# Patient Record
Sex: Female | Born: 1960
Health system: Southern US, Community
[De-identification: ages and names within clinical notes are randomized; demographics above are authoritative.]

## PROBLEM LIST (undated history)

## (undated) DIAGNOSIS — I428 Other cardiomyopathies: Secondary | ICD-10-CM

## (undated) DIAGNOSIS — Z9889 Other specified postprocedural states: Secondary | ICD-10-CM

## (undated) DIAGNOSIS — I4892 Unspecified atrial flutter: Secondary | ICD-10-CM

## (undated) DIAGNOSIS — R51 Headache: Secondary | ICD-10-CM

## (undated) DIAGNOSIS — G4733 Obstructive sleep apnea (adult) (pediatric): Secondary | ICD-10-CM

## (undated) DIAGNOSIS — I10 Essential (primary) hypertension: Secondary | ICD-10-CM

## (undated) DIAGNOSIS — R519 Headache, unspecified: Secondary | ICD-10-CM

## (undated) DIAGNOSIS — F329 Major depressive disorder, single episode, unspecified: Secondary | ICD-10-CM

## (undated) DIAGNOSIS — J984 Other disorders of lung: Secondary | ICD-10-CM

## (undated) DIAGNOSIS — F32A Depression, unspecified: Secondary | ICD-10-CM

## (undated) DIAGNOSIS — F419 Anxiety disorder, unspecified: Secondary | ICD-10-CM

## (undated) HISTORY — DX: Other specified postprocedural states: Z98.890

## (undated) HISTORY — DX: Essential (primary) hypertension: I10

## (undated) HISTORY — DX: Obstructive sleep apnea (adult) (pediatric): G47.33

## (undated) HISTORY — DX: Other cardiomyopathies: I42.8

## (undated) HISTORY — PX: ABDOMINAL HYSTERECTOMY: SHX81

## (undated) HISTORY — DX: Unspecified atrial flutter: I48.92

## (undated) HISTORY — PX: CHOLECYSTECTOMY: SHX55

## (undated) HISTORY — PX: COLONOSCOPY: SHX174

## (undated) HISTORY — DX: Other disorders of lung: J98.4

---

## 2008-08-26 ENCOUNTER — Ambulatory Visit (HOSPITAL_COMMUNITY): Payer: Self-pay | Admitting: Psychiatry

## 2008-09-16 ENCOUNTER — Ambulatory Visit (HOSPITAL_COMMUNITY): Payer: Self-pay | Admitting: Psychiatry

## 2008-10-16 ENCOUNTER — Ambulatory Visit (HOSPITAL_COMMUNITY): Payer: Self-pay | Admitting: Psychiatry

## 2011-07-14 ENCOUNTER — Encounter (INDEPENDENT_AMBULATORY_CARE_PROVIDER_SITE_OTHER): Payer: Self-pay | Admitting: General Surgery

## 2013-07-16 ENCOUNTER — Telehealth: Payer: Self-pay | Admitting: Obstetrics & Gynecology

## 2013-09-03 ENCOUNTER — Emergency Department (HOSPITAL_COMMUNITY)
Admission: EM | Admit: 2013-09-03 | Discharge: 2013-09-03 | Disposition: A | Discharge status: Home / Self Care | Payer: 59 | Attending: Emergency Medicine | Admitting: Emergency Medicine

## 2013-09-03 ENCOUNTER — Encounter (HOSPITAL_COMMUNITY): Payer: Self-pay | Admitting: Emergency Medicine

## 2013-09-03 DIAGNOSIS — M659 Synovitis and tenosynovitis, unspecified: Secondary | ICD-10-CM

## 2013-09-03 DIAGNOSIS — Z79899 Other long term (current) drug therapy: Secondary | ICD-10-CM | POA: Insufficient documentation

## 2013-09-03 DIAGNOSIS — M65839 Other synovitis and tenosynovitis, unspecified forearm: Secondary | ICD-10-CM | POA: Insufficient documentation

## 2013-09-03 DIAGNOSIS — M65849 Other synovitis and tenosynovitis, unspecified hand: Principal | ICD-10-CM

## 2013-09-03 MED ORDER — DEXAMETHASONE SODIUM PHOSPHATE 4 MG/ML IJ SOLN
8.0000 mg | Freq: Once | INTRAMUSCULAR | Status: DC
  Filled 2013-09-03: qty 2

## 2013-09-03 MED ORDER — PREDNISONE 50 MG PO TABS
60.0000 mg | ORAL_TABLET | Freq: Once | ORAL | Status: AC
  Administered 2013-09-03: 60 mg via ORAL

## 2013-09-03 MED ORDER — HYDROCODONE-ACETAMINOPHEN 5-325 MG PO TABS: 1.0000 | ORAL_TABLET | ORAL | Status: DC | PRN

## 2013-09-03 MED ORDER — PREDNISONE 50 MG PO TABS
ORAL_TABLET | ORAL | Status: AC
  Filled 2013-09-03: qty 1

## 2013-09-03 MED ORDER — PREDNISONE 10 MG PO TABS: 20.0000 mg | ORAL_TABLET | Freq: Every day | ORAL | Status: DC

## 2013-09-03 MED ORDER — PREDNISONE 10 MG PO TABS
ORAL_TABLET | ORAL | Status: AC
  Filled 2013-09-03: qty 1

## 2013-09-03 MED ORDER — MELOXICAM 7.5 MG PO TABS: ORAL_TABLET | ORAL | Status: DC

## 2013-09-03 MED ORDER — HYDROCODONE-ACETAMINOPHEN 5-325 MG PO TABS
1.0000 | ORAL_TABLET | Freq: Once | ORAL | Status: AC
Start: 2013-09-03 — End: 2013-09-03
  Administered 2013-09-03: 1 via ORAL
  Filled 2013-09-03: qty 1

## 2013-09-03 MED ORDER — KETOROLAC TROMETHAMINE 10 MG PO TABS
10.0000 mg | ORAL_TABLET | Freq: Once | ORAL | Status: AC
  Administered 2013-09-03: 10 mg via ORAL
  Filled 2013-09-03: qty 1

## 2013-09-03 NOTE — Discharge Instructions (Signed)
Please use the thumb spica splint until you are seen by the orthopedic specialist. Please use prednisone in Mobic daily until all taken. Use Norco for more severe pain. This medication may cause drowsiness, please use with caution. Tendinitis  Tendinitis is redness, soreness, and puffiness (inflammation) of the tendons. Tendons are band-like tissues that connect muscle to bone. Tendinitis often happens in the shoulders, heels, or elbows. It might happen if your job involves doing the same motions over and over. HOME CARE  Use a sling or splint as told by your doctor.  Put ice on the injured area.  Put ice in a plastic bag.  Place a towel between your skin and the bag.  Leave the ice on for 15-20 minutes, 03-04 times a day.  Avoid using your injured arm or leg until the pain goes away.  Do gentle exercises only as told by your doctor. Stop exercises if the pain gets worse, unless your doctor tells you otherwise.  Only take medicines as told by your doctor. GET HELP RIGHT AWAY IF:  Your pain and puffiness get worse.  You have new problems, such as loss of feeling (numbness) in the hands. MAKE SURE YOU:  Understand these instructions.  Will watch your condition.  Will get help right away if you are not doing well or get worse. Document Released: 09/02/2010 Document Revised: 08/15/2011 Document Reviewed: 09/02/2010 [facility] Patient Information 2014 [place], [place].

## 2013-09-03 NOTE — ED Provider Notes (Signed)
CSN: [id number]     Arrival date & time 09/03/13  1047 History   First MD Initiated Contact with Patient 09/03/13 1142     Chief Complaint  Patient presents with  . Hand Pain     (Consider location/radiation/quality/duration/timing/severity/associated sxs/prior Treatment) HPI Comments: Patient is a 53 year old female who presents to the emergency department with complaint of right hand pain. The patient states this is been going off and on for approximately 2 weeks. The patient denies any known injury. She states that the pain starts in the wrist and goes up toward the elbow. Within the last 2 days, the patient is now noticed that she has a" pop in her thumb" when she moves it and she's having increasing pain from the thumb across the top of her right hand and also extending back toward the elbow. The patient has not had any previous operations or procedures involving the hand. She's not had any high fever, drainage from the hand or thumb, or signs of infection. She presents now for additional assistance in evaluation concerning this pain.  Patient is a 53 y.o. female presenting with hand pain. The history is provided by the patient.  Hand Pain Associated symptoms include arthralgias. Pertinent negatives include no abdominal pain, chest pain, coughing or neck pain.    History reviewed. No pertinent past medical history. Past Surgical History  Procedure Laterality Date  . Cholecystectomy    . Abdominal hysterectomy     History reviewed. No pertinent family history. History  Substance Use Topics  . Smoking status: Never Smoker   . Smokeless tobacco: Not on file  . Alcohol Use: Yes     Comment: rarel   OB History   Grav Para Term Preterm Abortions TAB SAB Ect Mult Living                 Review of Systems  Constitutional: Negative for activity change.       All ROS Neg except as noted in HPI  HENT: Negative for nosebleeds.   Eyes: Negative for photophobia and discharge.   Respiratory: Negative for cough, shortness of breath and wheezing.   Cardiovascular: Negative for chest pain and palpitations.  Gastrointestinal: Negative for abdominal pain and blood in stool.  Genitourinary: Negative for dysuria, frequency and hematuria.  Musculoskeletal: Positive for arthralgias. Negative for back pain and neck pain.  Skin: Negative.   Neurological: Negative for dizziness, seizures and speech difficulty.  Psychiatric/Behavioral: Negative for hallucinations and confusion.      Allergies  Review of patient's allergies indicates no known allergies.  Home Medications   Current Outpatient Rx  Name  Route  Sig  Dispense  Refill  . lamoTRIgine (LAMICTAL) 100 MG tablet   Oral   Take 100 mg by mouth daily.         Marland Kitchen venlafaxine XR (EFFEXOR-XR) 150 MG 24 hr capsule   Oral   Take 150 mg by mouth daily with breakfast.          BP 139/77  Pulse 73  Temp(Src) 98.1 F (36.7 C) (Oral)  Resp 14  Ht 5\' 3"  (1.6 m)  Wt 256 lb 11.2 oz (116.438 kg)  BMI 45.48 kg/m2  SpO2 96% Physical Exam  Nursing note and vitals reviewed. Constitutional: She is oriented to person, place, and time. She appears well-developed and well-nourished.  Non-toxic appearance.  HENT:  Head: Normocephalic.  Right Ear: Tympanic membrane and external ear normal.  Left Ear: Tympanic membrane and external ear normal.  Eyes: EOM and lids are normal. Pupils are equal, round, and reactive to light.  Neck: Normal range of motion. Neck supple. Carotid bruit is not present.  Cardiovascular: Normal rate, regular rhythm, normal heart sounds, intact distal pulses and normal pulses.   Pulmonary/Chest: Breath sounds normal. No respiratory distress.  Abdominal: Soft. Bowel sounds are normal. There is no tenderness. There is no guarding.  Musculoskeletal: Normal range of motion.  There is good range of motion noted of the right shoulder and elbow. There is soreness with range of motion of the right wrist.  There is pain with attempted flexion and extension of the thumb. There is particular pain and some crepitus noted with flexion of the distal interphalangeal joint. The capillary refill is less than 2 seconds.  Lymphadenopathy:       Head (right side): No submandibular adenopathy present.       Head (left side): No submandibular adenopathy present.    She has no cervical adenopathy.  Neurological: She is alert and oriented to person, place, and time. She has normal strength. No cranial nerve deficit or sensory deficit.  Skin: Skin is warm and dry.  Psychiatric: Her speech is normal.  Depressed affect.    ED Course  Procedures (including critical care time) Labs Review Labs Reviewed - No data to display Imaging Review No results found.   EKG Interpretation None      MDM Vital signs are well within normal limits. Examination is consistent with a tenosynovitis. Patient is fitted with a thumb spica splint, prescription for Mobic, prednisone, and Norco given to the patient. Patient is given a referral to Dr. Romeo AppleHarrison for evaluation if not improving.    Final diagnoses:  None    *I have reviewed nursing notes, vital signs, and all appropriate lab and imaging results for this patient.Kathie Dike**    Carrah Eppolito M Neng Albee, PA-C 09/03/13 1226

## 2013-09-03 NOTE — Care Management Note (Signed)
ED/CM noted patient did not have health insurance and/or PCP listed in the computer.  Patient was given the [place] resource handout with information on the clinics, food pantries, and the handout for new health insurance sign-up.  Patient expressed appreciation for information received. 

## 2013-09-03 NOTE — ED Notes (Signed)
Pain rt hand for 2 weeks, No known injury

## 2013-09-04 NOTE — ED Provider Notes (Signed)
Medical screening examination/treatment/procedure(s) were performed by non-physician practitioner and as supervising physician I was immediately available for consultation/collaboration.   Somer Trotter R Marieme Mcmackin, MD 09/04/13 0722 

## 2015-05-11 ENCOUNTER — Encounter (INDEPENDENT_AMBULATORY_CARE_PROVIDER_SITE_OTHER): Payer: Self-pay | Admitting: *Deleted

## 2015-07-02 HISTORY — PX: TUMOR REMOVAL: SHX12

## 2015-12-14 ENCOUNTER — Encounter (HOSPITAL_COMMUNITY): Payer: Self-pay | Admitting: General Practice

## 2015-12-14 ENCOUNTER — Inpatient Hospital Stay (HOSPITAL_COMMUNITY)
Admission: AD | Admit: 2015-12-14 | Discharge: 2015-12-19 | DRG: 286 | Disposition: A | Discharge status: Home / Self Care | Payer: 59 | Source: Other Acute Inpatient Hospital | Attending: Internal Medicine | Admitting: Internal Medicine

## 2015-12-14 DIAGNOSIS — Z791 Long term (current) use of non-steroidal anti-inflammatories (NSAID): Secondary | ICD-10-CM

## 2015-12-14 DIAGNOSIS — F32A Depression, unspecified: Secondary | ICD-10-CM | POA: Diagnosis present

## 2015-12-14 DIAGNOSIS — R079 Chest pain, unspecified: Secondary | ICD-10-CM

## 2015-12-14 DIAGNOSIS — F329 Major depressive disorder, single episode, unspecified: Secondary | ICD-10-CM | POA: Diagnosis not present

## 2015-12-14 DIAGNOSIS — I428 Other cardiomyopathies: Secondary | ICD-10-CM | POA: Diagnosis not present

## 2015-12-14 DIAGNOSIS — I110 Hypertensive heart disease with heart failure: Secondary | ICD-10-CM | POA: Diagnosis present

## 2015-12-14 DIAGNOSIS — I4892 Unspecified atrial flutter: Principal | ICD-10-CM | POA: Diagnosis present

## 2015-12-14 DIAGNOSIS — Z7952 Long term (current) use of systemic steroids: Secondary | ICD-10-CM

## 2015-12-14 DIAGNOSIS — E669 Obesity, unspecified: Secondary | ICD-10-CM | POA: Diagnosis not present

## 2015-12-14 DIAGNOSIS — I5043 Acute on chronic combined systolic (congestive) and diastolic (congestive) heart failure: Secondary | ICD-10-CM | POA: Diagnosis not present

## 2015-12-14 DIAGNOSIS — F319 Bipolar disorder, unspecified: Secondary | ICD-10-CM | POA: Diagnosis not present

## 2015-12-14 DIAGNOSIS — R590 Localized enlarged lymph nodes: Secondary | ICD-10-CM | POA: Diagnosis present

## 2015-12-14 DIAGNOSIS — K219 Gastro-esophageal reflux disease without esophagitis: Secondary | ICD-10-CM | POA: Diagnosis present

## 2015-12-14 DIAGNOSIS — R0789 Other chest pain: Secondary | ICD-10-CM

## 2015-12-14 DIAGNOSIS — R000 Tachycardia, unspecified: Secondary | ICD-10-CM | POA: Diagnosis not present

## 2015-12-14 DIAGNOSIS — I5041 Acute combined systolic (congestive) and diastolic (congestive) heart failure: Secondary | ICD-10-CM | POA: Diagnosis present

## 2015-12-14 DIAGNOSIS — J984 Other disorders of lung: Secondary | ICD-10-CM | POA: Diagnosis not present

## 2015-12-14 DIAGNOSIS — I4891 Unspecified atrial fibrillation: Secondary | ICD-10-CM | POA: Diagnosis present

## 2015-12-14 DIAGNOSIS — G4733 Obstructive sleep apnea (adult) (pediatric): Secondary | ICD-10-CM | POA: Diagnosis not present

## 2015-12-14 DIAGNOSIS — R55 Syncope and collapse: Secondary | ICD-10-CM | POA: Diagnosis present

## 2015-12-14 DIAGNOSIS — R599 Enlarged lymph nodes, unspecified: Secondary | ICD-10-CM | POA: Diagnosis not present

## 2015-12-14 HISTORY — DX: Anxiety disorder, unspecified: F41.9

## 2015-12-14 HISTORY — DX: Major depressive disorder, single episode, unspecified: F32.9

## 2015-12-14 HISTORY — DX: Headache: R51

## 2015-12-14 HISTORY — DX: Headache, unspecified: R51.9

## 2015-12-14 HISTORY — DX: Depression, unspecified: F32.A

## 2015-12-14 LAB — COMPREHENSIVE METABOLIC PANEL
ALK PHOS: 91 U/L (ref 38–126)
ALT: 14 U/L (ref 14–54)
ANION GAP: 9 (ref 5–15)
AST: 14 U/L — ABNORMAL LOW (ref 15–41)
Albumin: 3.9 g/dL (ref 3.5–5.0)
BILIRUBIN TOTAL: 0.6 mg/dL (ref 0.3–1.2)
BUN: 19 mg/dL (ref 6–20)
CALCIUM: 9.7 mg/dL (ref 8.9–10.3)
CO2: 25 mmol/L (ref 22–32)
CREATININE: 0.92 mg/dL (ref 0.44–1.00)
Chloride: 99 mmol/L — ABNORMAL LOW (ref 101–111)
GLUCOSE: 152 mg/dL — AB (ref 65–99)
Potassium: 4.5 mmol/L (ref 3.5–5.1)
Sodium: 133 mmol/L — ABNORMAL LOW (ref 135–145)
TOTAL PROTEIN: 8 g/dL (ref 6.5–8.1)

## 2015-12-14 LAB — CBC
HCT: 38.8 % (ref 36.0–46.0)
Hemoglobin: 12.2 g/dL (ref 12.0–15.0)
MCH: 25.5 pg — AB (ref 26.0–34.0)
MCHC: 31.4 g/dL (ref 30.0–36.0)
MCV: 81.2 fL (ref 78.0–100.0)
PLATELETS: 355 10*3/uL (ref 150–400)
RBC: 4.78 MIL/uL (ref 3.87–5.11)
RDW: 16.2 % — AB (ref 11.5–15.5)
WBC: 16.1 10*3/uL — ABNORMAL HIGH (ref 4.0–10.5)

## 2015-12-14 LAB — RAPID URINE DRUG SCREEN, HOSP PERFORMED
Amphetamines: NOT DETECTED
BARBITURATES: NOT DETECTED
Benzodiazepines: NOT DETECTED
Cocaine: NOT DETECTED
Opiates: POSITIVE — AB
Tetrahydrocannabinol: NOT DETECTED

## 2015-12-14 LAB — MAGNESIUM: MAGNESIUM: 2.3 mg/dL (ref 1.7–2.4)

## 2015-12-14 LAB — ETHANOL

## 2015-12-14 LAB — TROPONIN I
Troponin I: 0.03 ng/mL (ref <0.03)
Troponin I: <0.03 ng/mL (ref <0.03)

## 2015-12-14 MED ORDER — HYDROCODONE-ACETAMINOPHEN 5-325 MG PO TABS: 1.0000 | ORAL_TABLET | ORAL | Status: DC | PRN

## 2015-12-14 MED ORDER — LAMOTRIGINE 100 MG PO TABS: 100.0000 mg | ORAL_TABLET | Freq: Every day | ORAL | Status: DC

## 2015-12-14 MED ORDER — SODIUM CHLORIDE 0.9 % IV SOLN
INTRAVENOUS | Status: DC
  Filled 2015-12-14: qty 1000

## 2015-12-14 MED ORDER — SODIUM CHLORIDE 0.9 % IV SOLN: INTRAVENOUS | Status: AC

## 2015-12-14 MED ORDER — MELOXICAM 7.5 MG PO TABS: 7.5000 mg | ORAL_TABLET | Freq: Every day | ORAL | Status: DC

## 2015-12-14 MED ORDER — VENLAFAXINE HCL ER 75 MG PO CP24: 150.0000 mg | ORAL_CAPSULE | Freq: Every day | ORAL | Status: DC

## 2015-12-14 MED ORDER — ONDANSETRON HCL 4 MG/2ML IJ SOLN: 4.0000 mg | Freq: Four times a day (QID) | INTRAMUSCULAR | Status: DC | PRN

## 2015-12-14 MED ORDER — ACETAMINOPHEN 325 MG PO TABS: 650.0000 mg | ORAL_TABLET | ORAL | Status: DC | PRN

## 2015-12-14 MED ORDER — PREDNISONE 20 MG PO TABS: 20.0000 mg | ORAL_TABLET | Freq: Every day | ORAL | Status: DC

## 2015-12-14 MED ORDER — VENLAFAXINE HCL 75 MG PO TABS
75.0000 mg | ORAL_TABLET | Freq: Two times a day (BID) | ORAL | Status: DC
  Administered 2015-12-14 – 2015-12-19 (×10): 75 mg via ORAL
  Filled 2015-12-14 (×10): qty 1

## 2015-12-14 MED ORDER — METOPROLOL TARTRATE 5 MG/5ML IV SOLN: 5.0000 mg | Freq: Four times a day (QID) | INTRAVENOUS | Status: DC | PRN

## 2015-12-14 MED ORDER — LAMOTRIGINE 150 MG PO TABS
150.0000 mg | ORAL_TABLET | Freq: Every day | ORAL | Status: DC
  Administered 2015-12-15 – 2015-12-19 (×5): 150 mg via ORAL
  Filled 2015-12-14 (×5): qty 1

## 2015-12-14 NOTE — Progress Notes (Signed)
Pt arrived to 2w37 via [facility]. MD paged. Pt hooked up to tele monitor. VSS. Phone and call bell within reach, will continue to monitor.

## 2015-12-14 NOTE — H&P (Signed)
History and Physical    Samantha Turner YQM:[id number] DOB: Feb 21, 1961 DOA: 12/14/2015  PCP: Dr. Clelia Croft, Ashish    Patient coming from: Home   Chief Complaint: syncope, fatigue  HPI:  Pt is 55 yo female with HTN, depression, who initially presented to [facility] 12/12/2015 for evaluation of chest pain and dyspnea, an episode of syncope. Pt explains this has started about one week prior to this admission, intermittent and mostly left sided, sharp and 7/10 in severity when present, occasionally radiating to the neck and back area between shoulder blades, down her left arm, no specific alleviating or aggravating factors, associated occasionally with dyspnea, numbness and tingling in left hand fingertips. Pt explains she was reluctant to even go see a doctor but she passed at on Saturday 7/8 and that is why she went to the hospital. Pt reports no similar events in the past, no fevers, chills, no abd or urinary concerns. She currently denies chest pain or dyspnea. She denies headaches, no visual changes, no numbness or tingling in LE's. Pt reports feeling tired but denies known weight loss or weight gain.   Blood work and studies at [facility]:  Pro BNP 532 (0-300)  Na 136, K 4.8,  Cl 98, CO2 24, BUN 16, Cr 0.83  AST 9 ALT 8, AP 98, Alb 4, PT 9.9, INR 1  WBC 4.6, Hg 11, MCV 79, Plt 317  Trop < 0.01 x 2 sets   BP 162/105, HR 110-130, T 36.32F, RR 20, 92% on RA  12 lead EKG atrial tachycardia with rate 127, no clear visible p waves, normal T waves  CXR - mild enlargement of cardiac silhouette and bilateral diffuse linear parahilar lung opacities, favor CHF  CTA chest - no evidence of PE, mediastinal and hilar adenopathy, mediastinal mass 3.8 x 3.5 x 5.9 cm, subcarinal mass 1.5 x 2.0 cm, ? Lymphoma vs sarcoidosis  Medications received at [facility]: Lasix 20 mg IV QD, Morphine IV, Zofran, NTG SL, Aspirin 324 mg PO, Prednisone   Review of Systems:  Constitutional: Negative for fever, chills,  diaphoresis, activity change.  HENT: Negative for ear pain, nosebleeds, congestion, facial swelling, rhinorrhea Eyes: Negative for pain, discharge, redness, itching and visual disturbance.  Respiratory: Negative for wheezing and stridor.   Cardiovascular: per HPI Gastrointestinal: Negative for abdominal distention.  Genitourinary: Negative for dysuria, urgency, frequency, hematuria, flank pain Musculoskeletal: Negative for back pain, joint swelling, arthralgias and gait problem.  Neurological: Negative for dizziness, tremors, seizures, facial asymmetry, speech difficulty, weakness Hematological: Does not bruise/bleed easily.  Psychiatric/Behavioral: Negative for hallucinations, behavioral problems, confusion, dysphoric mood, decreased concentration and agitation.   PMH: HTN, Bipolar disorder, GERD    Past Surgical History  Procedure Laterality Date  . Cholecystectomy    . Abdominal hysterectomy     Social Hx:  reports that she has never smoked. She does not have any smokeless tobacco history on file. She reports that she drinks alcohol. She reports that she does not use illicit drugs.  No Known Allergies  Mother died, had HTN and arthritis (pt thinks osteo not rheumatoid).   Prior to Admission medications   Medication Sig Start Date End Date Taking? Authorizing Provider  HYDROcodone-acetaminophen (NORCO) 5-325 MG per tablet Take 1 tablet by mouth every 4 (four) hours as needed for moderate pain. 09/03/13   Ivery Quale, PA-C  lamoTRIgine (LAMICTAL) 100 MG tablet Take 100 mg by mouth daily.    Historical Provider, MD  meloxicam (MOBIC) 7.5 MG tablet 1 po bid  with food 09/03/13   Ivery Quale, PA-C  predniSONE (DELTASONE) 10 MG tablet Take 2 tablets (20 mg total) by mouth daily. 09/03/13   Ivery Quale, PA-C  venlafaxine XR (EFFEXOR-XR) 150 MG 24 hr capsule Take 150 mg by mouth daily with breakfast.    Historical Provider, MD    Physical Exam: Filed Vitals:   12/14/15 1504 12/14/15  1509  BP:  123/83  Pulse:  128  Temp:  97.8 F (36.6 C)  TempSrc:  Oral  Height: 5\' 3"  (1.6 m)   SpO2:  96%      Constitutional: NAD, calm, comfortable Filed Vitals:   12/14/15 1504 12/14/15 1509  BP:  123/83  Pulse:  128  Temp:  97.8 F (36.6 C)  TempSrc:  Oral  Height: 5\' 3"  (1.6 m)   SpO2:  96%   Eyes: PERRL, lids and conjunctivae normal ENMT: Mucous membranes are moist. Posterior pharynx clear of any exudate or lesions. Normal dentition.  Neck: normal, supple, no masses, no thyromegaly Respiratory: Normal respiratory effort. No accessory muscle use.  Cardiovascular: Regular rhythm, tachycardic, no murmurs / rubs / gallops. No extremity edema.  Abdomen: no tenderness, no masses palpated. No hepatosplenomegaly. Bowel sounds positive.  Musculoskeletal: no clubbing / cyanosis. No joint deformity upper and lower extremities. Good ROM Skin: no rashes, lesions, ulcers. No induration Neurologic: CN 2-12 grossly intact. Sensation intact, DTR normal. Strength 5/5 in all 4.  Psychiatric: Normal judgment and insight. Alert and oriented x 3. Normal mood.   Labs on Admission: I have personally reviewed blood tests from outside hospital including imaging studies and available records. Family had copy they brought with them   CBC: No results for input(s): WBC, NEUTROABS, HGB, HCT, MCV, PLT in the last 168 hours. Basic Metabolic Panel: No results for input(s): NA, K, CL, CO2, GLUCOSE, BUN, CREATININE, CALCIUM, MG, PHOS in the last 168 hours. GFR: CrCl cannot be calculated (Unknown ideal weight.). Liver Function Tests: No results for input(s): AST, ALT, ALKPHOS, BILITOT, PROT, ALBUMIN in the last 168 hours. No results for input(s): LIPASE, AMYLASE in the last 168 hours. No results for input(s): AMMONIA in the last 168 hours. Coagulation Profile: No results for input(s): INR, PROTIME in the last 168 hours. Cardiac Enzymes: No results for input(s): CKTOTAL, CKMB, CKMBINDEX, TROPONINI  in the last 168 hours. BNP (last 3 results) No results for input(s): PROBNP in the last 8760 hours. HbA1C: No results for input(s): HGBA1C in the last 72 hours. CBG: No results for input(s): GLUCAP in the last 168 hours. Lipid Profile: No results for input(s): CHOL, HDL, LDLCALC, TRIG, CHOLHDL, LDLDIRECT in the last 72 hours. Thyroid Function Tests: No results for input(s): TSH, T4TOTAL, FREET4, T3FREE, THYROIDAB in the last 72 hours. Anemia Panel: No results for input(s): VITAMINB12, FOLATE, FERRITIN, TIBC, IRON, RETICCTPCT in the last 72 hours. Urine analysis: No results found for: COLORURINE, APPEARANCEUR, LABSPEC, PHURINE, GLUCOSEU, HGBUR, BILIRUBINUR, KETONESUR, PROTEINUR, UROBILINOGEN, NITRITE, LEUKOCYTESUR Sepsis Labs: ! @LABRCNTIP (procalcitonin:4,lacticidven:4) )No results found for this or any previous visit (from the past 240 hour(s)).   Radiological Exams on Admission: No results found.  EKG: Independently reviewed. Admission EKG pending   Assessment/Plan Principal Problem:   Chest pain - unclear etiology - will keep on telemetry for now - cycle CE's - consider cardiology consultation in AM - will check UDS, TSH as well  - will ask for ECHO and repeat CXR   Active Problems:   Hilar adenopathy - records reviewed, unclear etiology - will consult PCCM  in AM for further recommendations     Depression - appears to be stable at this time    Sinus tachycardia (HCC) - keep on tele, check TSH - cycle CE's   DVT prophylaxis: SCD's bilaterally Code Status: full code  Family Communication: Pt and family at bedside  Disposition Plan: to be determined  Consults called: None  Admission status: observation status   Debbora Presto MD [facility] Pager [phone]  If 7PM-7AM, please contact night-coverage [email or web address] Password [email or web address]  12/14/2015, 4:07 PM

## 2015-12-15 ENCOUNTER — Encounter (HOSPITAL_COMMUNITY): Payer: Self-pay | Admitting: Cardiovascular Disease

## 2015-12-15 ENCOUNTER — Observation Stay (HOSPITAL_COMMUNITY): Payer: 59

## 2015-12-15 ENCOUNTER — Observation Stay (HOSPITAL_BASED_OUTPATIENT_CLINIC_OR_DEPARTMENT_OTHER): Payer: 59

## 2015-12-15 DIAGNOSIS — I5041 Acute combined systolic (congestive) and diastolic (congestive) heart failure: Secondary | ICD-10-CM | POA: Diagnosis not present

## 2015-12-15 DIAGNOSIS — R599 Enlarged lymph nodes, unspecified: Secondary | ICD-10-CM

## 2015-12-15 DIAGNOSIS — Q330 Congenital cystic lung: Secondary | ICD-10-CM | POA: Diagnosis not present

## 2015-12-15 DIAGNOSIS — R079 Chest pain, unspecified: Secondary | ICD-10-CM

## 2015-12-15 DIAGNOSIS — I209 Angina pectoris, unspecified: Secondary | ICD-10-CM | POA: Diagnosis not present

## 2015-12-15 DIAGNOSIS — I483 Typical atrial flutter: Secondary | ICD-10-CM | POA: Diagnosis not present

## 2015-12-15 DIAGNOSIS — I429 Cardiomyopathy, unspecified: Secondary | ICD-10-CM | POA: Diagnosis not present

## 2015-12-15 DIAGNOSIS — R000 Tachycardia, unspecified: Secondary | ICD-10-CM | POA: Diagnosis not present

## 2015-12-15 LAB — CBC
HEMATOCRIT: 40.3 % (ref 36.0–46.0)
Hemoglobin: 12.3 g/dL (ref 12.0–15.0)
MCH: 24.9 pg — AB (ref 26.0–34.0)
MCHC: 30.5 g/dL (ref 30.0–36.0)
MCV: 81.6 fL (ref 78.0–100.0)
PLATELETS: 337 10*3/uL (ref 150–400)
RBC: 4.94 MIL/uL (ref 3.87–5.11)
RDW: 16.2 % — AB (ref 11.5–15.5)
WBC: 16.1 10*3/uL — ABNORMAL HIGH (ref 4.0–10.5)

## 2015-12-15 LAB — BASIC METABOLIC PANEL
Anion gap: 10 (ref 5–15)
BUN: 19 mg/dL (ref 6–20)
CHLORIDE: 101 mmol/L (ref 101–111)
CO2: 28 mmol/L (ref 22–32)
CREATININE: 0.9 mg/dL (ref 0.44–1.00)
Calcium: 9.8 mg/dL (ref 8.9–10.3)
GFR calc Af Amer: >60 mL/min (ref >60)
GFR calc non Af Amer: >60 mL/min (ref >60)
Glucose, Bld: 122 mg/dL — ABNORMAL HIGH (ref 65–99)
Potassium: 4.7 mmol/L (ref 3.5–5.1)
Sodium: 139 mmol/L (ref 135–145)

## 2015-12-15 LAB — TROPONIN I

## 2015-12-15 LAB — ECHOCARDIOGRAM COMPLETE: Height: 63 in

## 2015-12-15 LAB — TSH: TSH: 1.17 u[IU]/mL (ref 0.350–4.500)

## 2015-12-15 MED ORDER — OFF THE BEAT BOOK
Freq: Once | Status: AC
  Administered 2015-12-15: 17:00:00
  Filled 2015-12-15: qty 1

## 2015-12-15 MED ORDER — METOPROLOL TARTRATE 12.5 MG HALF TABLET
12.5000 mg | ORAL_TABLET | Freq: Four times a day (QID) | ORAL | Status: DC
  Administered 2015-12-15 – 2015-12-16 (×3): 12.5 mg via ORAL
  Filled 2015-12-15 (×4): qty 1

## 2015-12-15 MED ORDER — NITROGLYCERIN 0.4 MG SL SUBL: 0.4000 mg | SUBLINGUAL_TABLET | SUBLINGUAL | Status: DC | PRN

## 2015-12-15 MED ORDER — ATORVASTATIN CALCIUM 40 MG PO TABS
40.0000 mg | ORAL_TABLET | Freq: Every day | ORAL | Status: DC
  Administered 2015-12-15 – 2015-12-18 (×4): 40 mg via ORAL
  Filled 2015-12-15 (×4): qty 1

## 2015-12-15 MED ORDER — ASPIRIN 81 MG PO CHEW
81.0000 mg | CHEWABLE_TABLET | Freq: Every day | ORAL | Status: DC
  Administered 2015-12-15 – 2015-12-16 (×2): 81 mg via ORAL
  Filled 2015-12-15 (×2): qty 1

## 2015-12-15 MED ORDER — DILTIAZEM LOAD VIA INFUSION
10.0000 mg | Freq: Once | INTRAVENOUS | Status: DC
  Filled 2015-12-15: qty 10

## 2015-12-15 MED ORDER — ENOXAPARIN SODIUM 40 MG/0.4ML ~~LOC~~ SOLN
40.0000 mg | SUBCUTANEOUS | Status: DC
  Administered 2015-12-15: 40 mg via SUBCUTANEOUS
  Filled 2015-12-15: qty 0.4

## 2015-12-15 MED ORDER — METOPROLOL TARTRATE 25 MG PO TABS: 25.0000 mg | ORAL_TABLET | Freq: Two times a day (BID) | ORAL | Status: DC

## 2015-12-15 MED ORDER — METOPROLOL TARTRATE 5 MG/5ML IV SOLN
2.5000 mg | INTRAVENOUS | Status: AC
  Administered 2015-12-15: 2.5 mg via INTRAVENOUS
  Filled 2015-12-15: qty 5

## 2015-12-15 MED ORDER — DILTIAZEM HCL 100 MG IV SOLR
5.0000 mg/h | INTRAVENOUS | Status: DC
  Administered 2015-12-15 (×2): 15 mg/h via INTRAVENOUS
  Administered 2015-12-15: 5 mg/h via INTRAVENOUS
  Administered 2015-12-15: 10 mg/h via INTRAVENOUS
  Administered 2015-12-16 (×2): 15 mg/h via INTRAVENOUS
  Filled 2015-12-15 (×4): qty 100

## 2015-12-15 NOTE — Consult Note (Signed)
Name: Steven Veazie MRN: [id number] DOB: [birth year]    ADMISSION DATE:  12/14/2015 CONSULTATION DATE:  7/11  REFERRING MD :  Dr. Vanessa Barbara TRH  CHIEF COMPLAINT:  SOB/Hilar LAN  HISTORY OF PRESENT ILLNESS:  55 year old female with no significant past medical history with exception of morbid obesity. She has never smoked, rarely drinks, and does not use anything not prescribed to her. She does have a history of anxiety/depression. She worked in a Education [employer], environmental as a Location manager. In her usual state of health she has no limitation, however, for the past few months she has been experiencing periodic SOB. She reports this as worse in the mornings without cough, fever, or chills. Her husband reports that she snores heavily and has for years. She wakes up not feeling rested. 7/6 she was doing her normal morning activities when she was feeling a little dizzy and sat down on chair. As soon as she completely sat she "passed out". Her husband witnessed this and describes her being unconscious for about 30 seconds maximum. He called her name and she easily aroused. Since that time she has had worsening SOB and chest pain. Denies, cough, fever, chills. Chest pain was located L anterior chest with radiation to the back and is worse with inspiration and movement. She decided to present to [facility] ED 7/8 for SOB. She was given lasix at [facility] for presumed volume overload, however, she feels this has had little benefit. She was also started on prednisone. She had a CTA of the chest to rule out PE, which was negative for PE, however, mediastinal and hilar LAN and mediastinal mass 3.8x3.5x5.9cm, and small subcarinal mass. Concerning for lymphoma vs sarcoidosis. She was transferred to [facility] for specialty care. Echo obtained demonstrated LVEF 15-20% diffuse hypokinesis and akinesis of inferior wall. Grade 2 diastolic dysfunction.  She has felt better since arriving here and SOB is greatly improved. PCCM consulted for  SOB and Abnormal CT.   SIGNIFICANT EVENTS  7/8 admit to [facility] for SOB, CP, Syncope  STUDIES:  7/10 CTA chest no evidence of PE, mediastinal and hilar adenopathy, mediastinal mass 3.8 x 3.5 x 5.9 cm, subcarinal mass 1.5 x 2.0 cm, ? Lymphoma vs sarcoidosis   PAST MEDICAL HISTORY :   has a past medical history of Sinus tachycardia (HCC); Depression; Anxiety; and Headache.  has past surgical history that includes Cholecystectomy; Abdominal hysterectomy; and Tumor removal (07/02/2015). Prior to Admission medications   Medication Sig Start Date End Date Taking? Authorizing Provider  lamoTRIgine (LAMICTAL) 100 MG tablet Take 150 mg by mouth daily.    Yes Historical Provider, MD  naproxen sodium (ALEVE) 220 MG tablet Take 440 mg by mouth daily as needed (pain).   Yes Historical Provider, MD  venlafaxine (EFFEXOR) 75 MG tablet Take 75 mg by mouth 2 (two) times daily.   Yes Historical Provider, MD  HYDROcodone-acetaminophen (NORCO) 5-325 MG per tablet Take 1 tablet by mouth every 4 (four) hours as needed for moderate pain. Patient not taking: Reported on 12/14/2015 09/03/13   Ivery Quale, PA-C  meloxicam [facility]) 7.5 MG tablet 1 po bid with food Patient not taking: Reported on 12/14/2015 09/03/13   Ivery Quale, PA-C   No Known Allergies  FAMILY HISTORY:  family history is not on file. SOCIAL HISTORY:  reports that she has never smoked. She has never used smokeless tobacco. She reports that she drinks alcohol. She reports that she does not use illicit drugs.  REVIEW OF SYSTEMS:  Negative except for as described in HPI  SUBJECTIVE:   VITAL SIGNS: Temp:  [97.3 F (36.3 C)-98 F (36.7 C)] 97.3 F (36.3 C) (07/11 1523) Pulse Rate:  [131-136] 136 (07/11 1523) Resp:  [18] 18 (07/11 1523) BP: (111-145)/(81-94) 133/90 mmHg (07/11 1523) SpO2:  [95 %-99 %] 99 % (07/11 1523)  PHYSICAL EXAMINATION: General:  Morbidly obese female in NAD Neuro:  Alert, oriented, non-focal HEENT:  [place]/AT,  PERRL, unable to appreciate JVD Cardiovascular:  Tachy, IRIR, no MRG Lungs:  Clear bilateral breath sounds Abdomen:  Soft, non-tender, non-distended Musculoskeletal:  No acute deformity, trace (if any) peripheral edema Skin:  Grossly intact.   Recent Labs Lab 12/14/15 1618 12/15/15 0400  NA 133* 139  K 4.5 4.7  CL 99* 101  CO2 25 28  BUN 19 19  CREATININE 0.92 0.90  GLUCOSE 152* 122*    Recent Labs Lab 12/14/15 1618 12/15/15 0400  HGB 12.2 12.3  HCT 38.8 40.3  WBC 16.1* 16.1*  PLT 355 337   Dg Chest 2 View  12/15/2015  CLINICAL DATA:  Left chest pain and arm pain which radiates to patient's back. EXAM: CHEST  2 VIEW COMPARISON:  None. FINDINGS: The cardiac silhouette is normal. There is mild widening of the mediastinal contour with prominence of the contour of the ascending aorta. There is no evidence of focal airspace consolidation, pleural effusion or pneumothorax. Osseous structures are without acute abnormality. Soft tissues are grossly normal. IMPRESSION: Mild widening of the mediastinal contour with prominence of the contour of the ascending aorta. This may represent overlapping vascular shadows, however aneurysmal dilation of the ascending aorta cannot be excluded with this appearance. CT angiogram of the chest may be considered for further evaluation. These results will be called to the ordering clinician or representative by the Radiologist Assistant, and communication documented in the PACS or zVision Dashboard. Electronically Signed   By: Ted Mcalpine M.D.   On: 12/15/2015 09:17    ASSESSMENT / PLAN:  Obstructive sleep apnea (probable) - Nocturnal CPAP 8cmH2O - Will need formal polysomnogram as outpatient - Pulmonary hygiene with incentive spirometry per RT protocol.   Mediastinal Mass Subcarinal LAN - Will need tissue sampling, EBUS most appropriate - Will need cariology clearance for general anesthesia  Chronic systolic CHF with LVEF 15-20% Atrial  fibrillation with RVR - Cardiology to see  Joneen Roach, AGACNP-BC [facility] Pager [phone] or [phone]  12/15/2015 4:34 PM

## 2015-12-15 NOTE — Progress Notes (Signed)
Echocardiogram 2D Echocardiogram has been performed.  Samantha Turner 12/15/2015, 10:29 AM

## 2015-12-15 NOTE — Progress Notes (Addendum)
PROGRESS NOTE    Samantha Turner  NWG:[id number] DOB: [birth year] DOA: 12/14/2015 PCP: Kirstie Peri, MD   Brief Narrative:  Samantha Turner is a pleasant 55 year old female presented as a transfer from [facility] where she initially presented with complaints of chest pain, shortness of breath, palpitations, syncope. She had a CT of lungs done at that facility that revealed hilar adenopathy. She was transferred to this hospital for further workup. A transthoracic echocardiogram performed on 12/15/2015 showing severely reduced EF of 15-20%. Cardiology was consulted. Pulmonary medicine was also consulted regarding hilar adenopathy. Radiologists from outside facility included sarcoidosis and lymphoma in the differential diagnosis.   Assessment & Plan:   Principal Problem:   Acute combined systolic and diastolic congestive heart failure (HCC) Active Problems:   Chest pain   Depression   Hilar adenopathy   Sinus tachycardia (HCC)  1.  Acute combined systolic and diastolic congestive heart failure. -Samantha Turner presenting as a transfer from outside hospital, initially presented with complaints of chest pain and shortness of breath -Overnight troponins cycled and remained negative. -Transthoracic echocardiogram however revealed severely reduced ejection fraction of 15-20% with grade 2 diastolic dysfunction -Cardiology has been consulted -She'll likely need heart cath, however, will await further recommendations from cardiology  2.  Chest pain -EKG at outside hospital did not show acute ischemic changes, will repeat an EKG here -Will start aspirin 81 mg by mouth daily along with statin -Overnight troponins were cycled and remained negative 3 sets -As mentioned above she had a neck that showed a severely reduced ejection fraction of 15-20%, cardiology consulted, will likely require ischemic workup  3.   Atrial flutter -She remains in A flutter with ventricular rates in the 130s to 140s, EKG  repeated.  -Starting Cardizem drip -Has a CHADSVasc score of at least 3 -She would probably benefit from oral anticoagulation however will await cardiology's recommendations on this  4.  Hilar adenopathy -CT scan of lungs performed at outside hospital revealing hilar adenopathy. Their radiologist included sarcoid and lymphoma in the differential. Case was discussed with Dr. Delton Coombes of pulmonary critical care medicine -Will likely address acute cardiac issues prior to pulmonary work up.    DVT prophylaxis: Lovenox Code Status: Full code Family Communication: Spoke to her husband Disposition Plan: RTOG consulted  Consultants:   Cardiology  Procedures:   Transthoracic echocardiogram performed on 02/15/2016 impression: Left ventricle: The cavity size was moderately dilated. Wall  thickness was normal. Systolic function was severely reduced. The  estimated ejection fraction was in the range of 15% to 20%.  Diffuse hypokinesis. Akinesis of the inferior myocardium. There  was fusion of early and atrial contributions to ventricular  filling. Features are consistent with a pseudonormal left  ventricular filling pattern, with concomitant abnormal relaxation  and increased filling pressure (grade 2 diastolic dysfunction). - Mitral valve: There was mild to moderate regurgitation. - Left atrium: The atrium was mildly dilated. - Right atrium: The atrium was mildly dilated. - Pericardium, extracardiac: A trivial pericardial effusion was  identified.  Antimicrobials:      Subjective: Patient reporting ongoing shortness of breath, currently denies chest pain  Objective: Filed Vitals:   12/14/15 1504 12/14/15 1509 12/14/15 2000 12/15/15 0442  BP:  123/83 115/81 111/83  Pulse:  128 131 135  Temp:  97.8 F (36.6 C) 98 F (36.7 C) 97.8 F (36.6 C)  TempSrc:  Oral Oral Oral  Resp:   18 18  Height: 5\' 3"  (1.6 m)     SpO2:  96% 95% 97%    Intake/Output Summary (Last 24 hours)  at 12/15/15 1354 Last data filed at 12/14/15 1700  Gross per 24 hour  Intake    120 ml  Output      0 ml  Net    120 ml   There were no vitals filed for this visit.  Examination:  General exam: Appears calm and comfortable, No acute distress Respiratory system: Clear to auscultation. Respiratory effort normal. Cardiovascular system: Irregular rate and rhythm. No JVD, murmurs, rubs, gallops or clicks. No pedal edema. Gastrointestinal system: Abdomen is nondistended, soft and nontender. No organomegaly or masses felt. Normal bowel sounds heard. Central nervous system: Alert and oriented. No focal neurological deficits. Extremities: Symmetric 5 x 5 power. Skin: No rashes, lesions or ulcers Psychiatry: Judgement and insight appear normal. Mood & affect appropriate.     Data Reviewed: I have personally reviewed following labs and imaging studies  CBC:  Recent Labs Lab 12/14/15 1618 12/15/15 0400  WBC 16.1* 16.1*  HGB 12.2 12.3  HCT 38.8 40.3  MCV 81.2 81.6  PLT 355 337   Basic Metabolic Panel:  Recent Labs Lab 12/14/15 1618 12/15/15 0400  NA 133* 139  K 4.5 4.7  CL 99* 101  CO2 25 28  GLUCOSE 152* 122*  BUN 19 19  CREATININE 0.92 0.90  CALCIUM 9.7 9.8  MG 2.3  --    GFR: CrCl cannot be calculated (Unknown ideal weight.). Liver Function Tests:  Recent Labs Lab 12/14/15 1618  AST 14*  ALT 14  ALKPHOS 91  BILITOT 0.6  PROT 8.0  ALBUMIN 3.9   No results for input(s): LIPASE, AMYLASE in the last 168 hours. No results for input(s): AMMONIA in the last 168 hours. Coagulation Profile: No results for input(s): INR, PROTIME in the last 168 hours. Cardiac Enzymes:  Recent Labs Lab 12/14/15 1618 12/14/15 2123 12/15/15 0400  TROPONINI 0.03* <0.03 <0.03   BNP (last 3 results) No results for input(s): PROBNP in the last 8760 hours. HbA1C: No results for input(s): HGBA1C in the last 72 hours. CBG: No results for input(s): GLUCAP in the last 168  hours. Lipid Profile: No results for input(s): CHOL, HDL, LDLCALC, TRIG, CHOLHDL, LDLDIRECT in the last 72 hours. Thyroid Function Tests:  Recent Labs  12/15/15 0400  TSH 1.170   Anemia Panel: No results for input(s): VITAMINB12, FOLATE, FERRITIN, TIBC, IRON, RETICCTPCT in the last 72 hours. Sepsis Labs: No results for input(s): PROCALCITON, LATICACIDVEN in the last 168 hours.  No results found for this or any previous visit (from the past 240 hour(s)).       Radiology Studies: Dg Chest 2 View  12/15/2015  CLINICAL DATA:  Left chest pain and arm pain which radiates to patient's back. EXAM: CHEST  2 VIEW COMPARISON:  None. FINDINGS: The cardiac silhouette is normal. There is mild widening of the mediastinal contour with prominence of the contour of the ascending aorta. There is no evidence of focal airspace consolidation, pleural effusion or pneumothorax. Osseous structures are without acute abnormality. Soft tissues are grossly normal. IMPRESSION: Mild widening of the mediastinal contour with prominence of the contour of the ascending aorta. This may represent overlapping vascular shadows, however aneurysmal dilation of the ascending aorta cannot be excluded with this appearance. CT angiogram of the chest may be considered for further evaluation. These results will be called to the ordering clinician or representative by the Radiologist Assistant, and communication documented in the PACS or zVision Dashboard. Electronically Signed  By: Ted Mcalpine M.D.   On: 12/15/2015 09:17        Scheduled Meds: . lamoTRIgine  150 mg Oral Daily  . venlafaxine  75 mg Oral BID   Continuous Infusions:    LOS: 1 day    Time spent: 35 min    Jeralyn Bennett, MD [facility] Pager [phone]  If 7PM-7AM, please contact night-coverage [email or web address] Password [email or web address] 12/15/2015, 1:54 PM

## 2015-12-15 NOTE — Consult Note (Signed)
Patient ID: Samantha Turner MRN: [id number] DOB/AGE: [birth year] 55 y.o.  Admit date: 12/14/2015 Primary Physician Kirstie Peri, MD  Primary Cardiologist unassigned Duke Salvia)   Chief Complaint  Syncope, atrial flutter  HPI: Samantha Turner is a 55F with obesity and no other significant past medical history who presents as a transfer from [facility] after an episode of syncope and a subsequent diagnosis of new-onset systolic and diastolic dysfunction.  She reports that for the last several months she's had intermittent episodes of substernal chest tightness. This typically occurs with exertion and happens 2-3 times per week. Each episode lasts only for a few moments..  There is no associated nausea, vomiting, or diaphoresis. For the last month she has also noted exertional dyspnea. She denies lower extremity edema or weight gain. She has noted orthopnea, though this is not unchanged from her baseline. She reports heavy snoring and her husband notes that she sometimes stops breathing overnight. She does not feel well rested in the mornings but denies daytime somnolence.  On 7/6 Samantha Turner had an episode of syncope that occurred after sitting on the couch. She denies any preceding chest pain, shortness of breath, lightheadedness, dizziness, or palpitations. Her asthma reports that she sat on the couch and slumped to one side. She was out for approximately 1 minute and he does not know whether she was breathing at a time. Upon awakening she felt tired but was oriented. There is no loss of bowel or bladder function. Two days later she presented to the [facility] emergency department  Due to progressive shortness of breath. In the ED she received Lasix but did not have any clinical improvement. She also had a CT angiogram the chest that was negative for for pulmonary embolism but did note mediastinal and hilar lymphadenopathy. There is also a mediastinal mass measuring 3.8 x 3.5 x 5.9 cm in the subcarinal  mass. There was concern for sarcoidosis or lymphoma.  She was transferred to [facility] for additional workup.  Samantha Turner had an echo that revealed LVEF 15-20% with diffuse hypokinesis. There was akinesis of the inferior myocardium. She also had grade 2 diastolic dysfunction and a trivial pericardial effusion.  She was also noted to be in atrial flutter with rate in the 130s to 140s. She was started on a diltiazem infusion without improvement in her rates. Cardiology was consult is for newly diagnosed heart failure and atrial flutter.  Of note, Samantha Turner's mother died of congestive heart failure in her late 24s.  Review of Systems:   A 12 point review of systems was obtained and was negative with exceptions as noted in history of present illness.  Past Medical History  Diagnosis Date  . Sinus tachycardia (HCC)   . Depression   . Anxiety   . Headache     Medications Prior to Admission  Medication Sig Dispense Refill  . lamoTRIgine (LAMICTAL) 100 MG tablet Take 150 mg by mouth daily.     . naproxen sodium (ALEVE) 220 MG tablet Take 440 mg by mouth daily as needed (pain).    Marland Kitchen venlafaxine (EFFEXOR) 75 MG tablet Take 75 mg by mouth 2 (two) times daily.    Marland Kitchen HYDROcodone-acetaminophen (NORCO) 5-325 MG per tablet Take 1 tablet by mouth every 4 (four) hours as needed for moderate pain. (Patient not taking: Reported on 12/14/2015) 15 tablet 0  . meloxicam (MOBIC) 7.5 MG tablet 1 po bid with food (Patient not taking: Reported on 12/14/2015) 12 tablet 0     .  aspirin  81 mg Oral Daily  . atorvastatin  40 mg Oral q1800  . diltiazem  10 mg Intravenous Once  . enoxaparin (LOVENOX) injection  40 mg Subcutaneous Q24H  . lamoTRIgine  150 mg Oral Daily  . venlafaxine  75 mg Oral BID    Infusions: . diltiazem (CARDIZEM) infusion 15 mg/hr (12/15/15 1442)    No Known Allergies  Social History   Social History  . Marital Status: Single    Spouse Name: N/A  . Number of Children: N/A  . Years  of Education: N/A   Occupational History  . Not on file.   Social History Main Topics  . Smoking status: Never Smoker   . Smokeless tobacco: Never Used  . Alcohol Use: Yes     Comment: rarel  . Drug Use: No  . Sexual Activity: Yes    Birth Control/ Protection: Surgical   Other Topics Concern  . Not on file   Social History Narrative    History reviewed. No pertinent family history.  PHYSICAL EXAM: Filed Vitals:   12/15/15 1606 12/15/15 1627  BP: 120/80 116/81  Pulse:    Temp:    Resp:       Intake/Output Summary (Last 24 hours) at 12/15/15 1633 Last data filed at 12/14/15 1700  Gross per 24 hour  Intake    120 ml  Output      0 ml  Net    120 ml    General:  Well appearing. No respiratory difficulty HEENT: normal Neck: supple. no JVD. Carotids 2+ bilat; no bruits.  Cor: Tachycardic. Regular PM rhythm. I nondisplaced.  No rubs, gallops or murmurs. Lungs: clear to auscultation bilaterally. No crackles, rhonchi, or wheezes.  Abdomen: soft, nontender, nondistended. No hepatosplenomegaly. No bruits or masses. Good bowel sounds. Extremities: no cyanosis, clubbing, rash, edema Neuro: alert & oriented x 3, cranial nerves grossly intact. moves all 4 extremities w/o difficulty. Affect pleasant.  Results for orders placed or performed during the hospital encounter of 12/14/15 (from the past 24 hour(s))  Troponin I-serum (one time only)     Status: None   Collection Time: 12/14/15  9:23 PM  Result Value Ref Range   Troponin I <0.03 <0.03 ng/mL  Urine rapid drug screen (hosp performed)     Status: Abnormal   Collection Time: 12/14/15 10:07 PM  Result Value Ref Range   Opiates POSITIVE (A) NONE DETECTED   Cocaine NONE DETECTED NONE DETECTED   Benzodiazepines NONE DETECTED NONE DETECTED   Amphetamines NONE DETECTED NONE DETECTED   Tetrahydrocannabinol NONE DETECTED NONE DETECTED   Barbiturates NONE DETECTED NONE DETECTED  Troponin I-serum (one time only)     Status:  None   Collection Time: 12/15/15  4:00 AM  Result Value Ref Range   Troponin I <0.03 <0.03 ng/mL  Basic metabolic panel     Status: Abnormal   Collection Time: 12/15/15  4:00 AM  Result Value Ref Range   Sodium 139 135 - 145 mmol/L   Potassium 4.7 3.5 - 5.1 mmol/L   Chloride 101 101 - 111 mmol/L   CO2 28 22 - 32 mmol/L   Glucose, Bld 122 (H) 65 - 99 mg/dL   BUN 19 6 - 20 mg/dL   Creatinine, Ser 9.56 0.44 - 1.00 mg/dL   Calcium 9.8 8.9 - 21.3 mg/dL   GFR calc non Af Amer >60 >60 mL/min   GFR calc Af Amer >60 >60 mL/min   Anion gap 10 5 - 15  CBC     Status: Abnormal   Collection Time: 12/15/15  4:00 AM  Result Value Ref Range   WBC 16.1 (H) 4.0 - 10.5 K/uL   RBC 4.94 3.87 - 5.11 MIL/uL   Hemoglobin 12.3 12.0 - 15.0 g/dL   HCT 69.6 29.5 - 28.4 %   MCV 81.6 78.0 - 100.0 fL   MCH 24.9 (L) 26.0 - 34.0 pg   MCHC 30.5 30.0 - 36.0 g/dL   RDW 13.2 (H) 44.0 - 10.2 %   Platelets 337 150 - 400 K/uL  TSH     Status: None   Collection Time: 12/15/15  4:00 AM  Result Value Ref Range   TSH 1.170 0.350 - 4.500 uIU/mL   Dg Chest 2 View  12/15/2015  CLINICAL DATA:  Left chest pain and arm pain which radiates to patient's back. EXAM: CHEST  2 VIEW COMPARISON:  None. FINDINGS: The cardiac silhouette is normal. There is mild widening of the mediastinal contour with prominence of the contour of the ascending aorta. There is no evidence of focal airspace consolidation, pleural effusion or pneumothorax. Osseous structures are without acute abnormality. Soft tissues are grossly normal. IMPRESSION: Mild widening of the mediastinal contour with prominence of the contour of the ascending aorta. This may represent overlapping vascular shadows, however aneurysmal dilation of the ascending aorta cannot be excluded with this appearance. CT angiogram of the chest may be considered for further evaluation. These results will be called to the ordering clinician or representative by the Radiologist Assistant, and  communication documented in the PACS or zVision Dashboard. Electronically Signed   By: Ted Mcalpine M.D.   On: 12/15/2015 09:17    Echo 12/15/15: Study Conclusions  - Left ventricle: The cavity size was moderately dilated. Wall  thickness was normal. Systolic function was severely reduced. The  estimated ejection fraction was in the range of 15% to 20%.  Diffuse hypokinesis. Akinesis of the inferior myocardium. There  was fusion of early and atrial contributions to ventricular  filling. Features are consistent with a pseudonormal left  ventricular filling pattern, with concomitant abnormal relaxation  and increased filling pressure (grade 2 diastolic dysfunction). - Mitral valve: There was mild to moderate regurgitation. - Left atrium: The atrium was mildly dilated. - Right atrium: The atrium was mildly dilated. - Pericardium, extracardiac: A trivial pericardial effusion was  identified.  ECG:  typical atrial flutter with 2:1 AV conduction delay.  Ventricular rate 137. Right axis deviation concerning for RVH versus less likely prior posterior infarct.   ASSESSMENT/PLAN:  # Atrial flutter: Heart rate has been very poorly-controlled on the diltiazem infusion. We recommended giving metoprolol 2.5 mg IV times one which seems to have improved her heart rate. We will start metoprolol tartrate 12.5 mg every 6 hours. Ideally, she would not be on diltiazem given her LV dysfunction. We will try to increase the metoprolol and use the diltiazem. Given her severe LV dysfunction we will plan for transesophageal echo with direct current cardioversion this admission if she does not convert on her own.  We will start heparin for anticoagulation with plans to convert to oral anticoagulation after cardiac catheterization.This patients CHA2DS2-VASc Score and unadjusted Ischemic Stroke Rate (% per year) is equal to 2.2 % stroke rate/year from a score of 2  Above score calculated as 1 point each  if present [CHF, HTN, DM, Vascular=MI/PAD/Aortic Plaque, Age if 65-74, or Female] Above score calculated as 2 points each if present [Age > 75, or Stroke/TIA/TE]   #  Acute systolic and diastolic heart failure: Newly diagnosed this admission. Clinically she does not appear to be volume overloaded. Given the finding of lymphadenopathy and focal wall motion abnormalities, this is concerning for a presentation of sarcoidosis. It is also possible that it could be due to ischemia. Once her rhythm has settled down we will need to pursue cardiac catheterization and possibly cardiac MRI if no ischemia is noted on cath.   Signed: Aairah Negrette C. [facility] Salvia, MD, [facility]  12/15/2015, 4:33 PM

## 2015-12-16 ENCOUNTER — Ambulatory Visit (HOSPITAL_COMMUNITY): Admit: 2015-12-16 | Payer: Self-pay | Admitting: Cardiovascular Disease

## 2015-12-16 ENCOUNTER — Encounter (HOSPITAL_COMMUNITY): Payer: Self-pay | Admitting: Cardiovascular Disease

## 2015-12-16 ENCOUNTER — Encounter (HOSPITAL_COMMUNITY)
Admission: AD | Disposition: A | Discharge status: Home / Self Care | Payer: Self-pay | Source: Other Acute Inpatient Hospital | Attending: Internal Medicine

## 2015-12-16 DIAGNOSIS — R079 Chest pain, unspecified: Secondary | ICD-10-CM

## 2015-12-16 DIAGNOSIS — I483 Typical atrial flutter: Secondary | ICD-10-CM | POA: Diagnosis not present

## 2015-12-16 DIAGNOSIS — I5041 Acute combined systolic (congestive) and diastolic (congestive) heart failure: Secondary | ICD-10-CM | POA: Diagnosis not present

## 2015-12-16 DIAGNOSIS — F329 Major depressive disorder, single episode, unspecified: Secondary | ICD-10-CM

## 2015-12-16 DIAGNOSIS — I4892 Unspecified atrial flutter: Secondary | ICD-10-CM

## 2015-12-16 DIAGNOSIS — R000 Tachycardia, unspecified: Secondary | ICD-10-CM | POA: Diagnosis not present

## 2015-12-16 DIAGNOSIS — I209 Angina pectoris, unspecified: Secondary | ICD-10-CM | POA: Diagnosis not present

## 2015-12-16 DIAGNOSIS — R599 Enlarged lymph nodes, unspecified: Secondary | ICD-10-CM | POA: Diagnosis not present

## 2015-12-16 DIAGNOSIS — J984 Other disorders of lung: Secondary | ICD-10-CM | POA: Insufficient documentation

## 2015-12-16 DIAGNOSIS — Q330 Congenital cystic lung: Secondary | ICD-10-CM | POA: Diagnosis not present

## 2015-12-16 HISTORY — PX: CARDIAC CATHETERIZATION: SHX172

## 2015-12-16 LAB — LIPID PANEL
CHOL/HDL RATIO: 8.6 ratio
Cholesterol: 214 mg/dL — ABNORMAL HIGH (ref 0–200)
HDL: 25 mg/dL — AB (ref >40)
LDL CALC: 109 mg/dL — AB (ref 0–99)
Triglycerides: 399 mg/dL — ABNORMAL HIGH (ref <150)
VLDL: 80 mg/dL — AB (ref 0–40)

## 2015-12-16 LAB — COMPREHENSIVE METABOLIC PANEL
ALK PHOS: 87 U/L (ref 38–126)
ALT: 13 U/L — AB (ref 14–54)
AST: 14 U/L — AB (ref 15–41)
Albumin: 3.8 g/dL (ref 3.5–5.0)
Anion gap: 8 (ref 5–15)
BUN: 22 mg/dL — AB (ref 6–20)
CALCIUM: 9.3 mg/dL (ref 8.9–10.3)
CHLORIDE: 101 mmol/L (ref 101–111)
CO2: 28 mmol/L (ref 22–32)
CREATININE: 1.03 mg/dL — AB (ref 0.44–1.00)
GFR calc Af Amer: >60 mL/min (ref >60)
GFR, EST NON AFRICAN AMERICAN: 60 mL/min — AB (ref >60)
Glucose, Bld: 118 mg/dL — ABNORMAL HIGH (ref 65–99)
Potassium: 4.4 mmol/L (ref 3.5–5.1)
SODIUM: 137 mmol/L (ref 135–145)
Total Bilirubin: 0.7 mg/dL (ref 0.3–1.2)
Total Protein: 7.4 g/dL (ref 6.5–8.1)

## 2015-12-16 LAB — CBC
HCT: 40.9 % (ref 36.0–46.0)
Hemoglobin: 12.7 g/dL (ref 12.0–15.0)
MCH: 25.2 pg — AB (ref 26.0–34.0)
MCHC: 31.1 g/dL (ref 30.0–36.0)
MCV: 81.2 fL (ref 78.0–100.0)
PLATELETS: 380 10*3/uL (ref 150–400)
RBC: 5.04 MIL/uL (ref 3.87–5.11)
RDW: 16.5 % — AB (ref 11.5–15.5)
WBC: 16.2 10*3/uL — ABNORMAL HIGH (ref 4.0–10.5)

## 2015-12-16 LAB — HEMOGLOBIN A1C
Hgb A1c MFr Bld: 5.7 % — ABNORMAL HIGH (ref 4.8–5.6)
MEAN PLASMA GLUCOSE: 117 mg/dL

## 2015-12-16 LAB — PROTIME-INR
INR: 1.05 (ref 0.00–1.49)
PROTHROMBIN TIME: 13.9 s (ref 11.6–15.2)

## 2015-12-16 SURGERY — LEFT HEART CATH AND CORONARY ANGIOGRAPHY

## 2015-12-16 MED ORDER — MIDAZOLAM HCL 2 MG/2ML IJ SOLN
INTRAMUSCULAR | Status: DC | PRN
  Administered 2015-12-16 (×2): 1 mg via INTRAVENOUS

## 2015-12-16 MED ORDER — METOPROLOL TARTRATE 25 MG PO TABS
25.0000 mg | ORAL_TABLET | Freq: Four times a day (QID) | ORAL | Status: DC
  Administered 2015-12-16 – 2015-12-17 (×3): 25 mg via ORAL
  Filled 2015-12-16 (×3): qty 1

## 2015-12-16 MED ORDER — VERAPAMIL HCL 2.5 MG/ML IV SOLN
INTRAVENOUS | Status: AC
  Filled 2015-12-16: qty 2

## 2015-12-16 MED ORDER — SODIUM CHLORIDE 0.9 % IV SOLN
250.0000 mL | INTRAVENOUS | Status: DC | PRN
Start: 2015-12-16 — End: 2015-12-16

## 2015-12-16 MED ORDER — DILTIAZEM HCL 60 MG PO TABS
90.0000 mg | ORAL_TABLET | Freq: Four times a day (QID) | ORAL | Status: DC
  Administered 2015-12-16 – 2015-12-17 (×4): 90 mg via ORAL
  Filled 2015-12-16 (×4): qty 1

## 2015-12-16 MED ORDER — SODIUM CHLORIDE 0.9% FLUSH: 3.0000 mL | INTRAVENOUS | Status: DC | PRN

## 2015-12-16 MED ORDER — FENTANYL CITRATE (PF) 100 MCG/2ML IJ SOLN
INTRAMUSCULAR | Status: DC | PRN
  Administered 2015-12-16 (×2): 25 ug via INTRAVENOUS

## 2015-12-16 MED ORDER — SODIUM CHLORIDE 0.9 % IV SOLN
INTRAVENOUS | Status: DC
  Administered 2015-12-16: 10:00:00 via INTRAVENOUS

## 2015-12-16 MED ORDER — HEPARIN SODIUM (PORCINE) 1000 UNIT/ML IJ SOLN
INTRAMUSCULAR | Status: DC | PRN
  Administered 2015-12-16: 5000 [IU] via INTRAVENOUS

## 2015-12-16 MED ORDER — HEPARIN (PORCINE) IN NACL 2-0.9 UNIT/ML-% IJ SOLN
INTRAMUSCULAR | Status: AC
  Filled 2015-12-16: qty 500

## 2015-12-16 MED ORDER — ASPIRIN 81 MG PO CHEW: 81.0000 mg | CHEWABLE_TABLET | ORAL | Status: AC

## 2015-12-16 MED ORDER — HEPARIN (PORCINE) IN NACL 2-0.9 UNIT/ML-% IJ SOLN
INTRAMUSCULAR | Status: DC | PRN
  Administered 2015-12-16: 1500 mL

## 2015-12-16 MED ORDER — HEPARIN (PORCINE) IN NACL 100-0.45 UNIT/ML-% IJ SOLN
1150.0000 [IU]/h | INTRAMUSCULAR | Status: DC
  Administered 2015-12-16: 1150 [IU]/h via INTRAVENOUS
  Filled 2015-12-16: qty 250

## 2015-12-16 MED ORDER — SODIUM CHLORIDE 0.9% FLUSH
3.0000 mL | Freq: Two times a day (BID) | INTRAVENOUS | Status: DC
  Administered 2015-12-16 – 2015-12-18 (×4): 3 mL via INTRAVENOUS

## 2015-12-16 MED ORDER — MIDAZOLAM HCL 2 MG/2ML IJ SOLN
INTRAMUSCULAR | Status: AC
  Filled 2015-12-16: qty 2

## 2015-12-16 MED ORDER — HEPARIN SODIUM (PORCINE) 1000 UNIT/ML IJ SOLN
INTRAMUSCULAR | Status: AC
  Filled 2015-12-16: qty 1

## 2015-12-16 MED ORDER — LIDOCAINE HCL (PF) 1 % IJ SOLN
INTRAMUSCULAR | Status: DC | PRN
  Administered 2015-12-16: 3 mL

## 2015-12-16 MED ORDER — LIDOCAINE HCL (PF) 1 % IJ SOLN
INTRAMUSCULAR | Status: AC
  Filled 2015-12-16: qty 30

## 2015-12-16 MED ORDER — HEPARIN BOLUS VIA INFUSION
4000.0000 [IU] | Freq: Once | INTRAVENOUS | Status: AC
  Administered 2015-12-16: 4000 [IU] via INTRAVENOUS
  Filled 2015-12-16: qty 4000

## 2015-12-16 MED ORDER — IOPAMIDOL (ISOVUE-370) INJECTION 76%
INTRAVENOUS | Status: DC | PRN
  Administered 2015-12-16: 90 mL via INTRAVENOUS

## 2015-12-16 MED ORDER — VERAPAMIL HCL 2.5 MG/ML IV SOLN
INTRAVENOUS | Status: DC | PRN
  Administered 2015-12-16: 10 mL via INTRA_ARTERIAL

## 2015-12-16 MED ORDER — SODIUM CHLORIDE 0.9 % IV SOLN: 250.0000 mL | INTRAVENOUS | Status: DC | PRN

## 2015-12-16 MED ORDER — FENTANYL CITRATE (PF) 100 MCG/2ML IJ SOLN
INTRAMUSCULAR | Status: AC
  Filled 2015-12-16: qty 2

## 2015-12-16 MED ORDER — SODIUM CHLORIDE 0.9 % IV SOLN
INTRAVENOUS | Status: DC
  Administered 2015-12-16: 19:00:00 via INTRAVENOUS
  Administered 2015-12-16: 75 mL via INTRAVENOUS

## 2015-12-16 MED ORDER — APIXABAN 5 MG PO TABS
5.0000 mg | ORAL_TABLET | Freq: Two times a day (BID) | ORAL | Status: DC
  Administered 2015-12-17 – 2015-12-19 (×5): 5 mg via ORAL
  Filled 2015-12-16 (×5): qty 1

## 2015-12-16 MED ORDER — SODIUM CHLORIDE 0.9% FLUSH
3.0000 mL | Freq: Two times a day (BID) | INTRAVENOUS | Status: DC
  Administered 2015-12-16: 3 mL via INTRAVENOUS

## 2015-12-16 MED ORDER — HEPARIN (PORCINE) IN NACL 2-0.9 UNIT/ML-% IJ SOLN
INTRAMUSCULAR | Status: AC
  Filled 2015-12-16: qty 1000

## 2015-12-16 MED ORDER — IOPAMIDOL (ISOVUE-370) INJECTION 76%
INTRAVENOUS | Status: AC
  Filled 2015-12-16: qty 100

## 2015-12-16 SURGICAL SUPPLY — 10 items
CATH INFINITI 5FR ANG PIGTAIL (CATHETERS) ×3 IMPLANT
CATH OPTITORQUE JACKY 4.0 5F (CATHETERS) ×3 IMPLANT
DEVICE RAD COMP TR BAND LRG (VASCULAR PRODUCTS) ×3 IMPLANT
GLIDESHEATH SLEND SS 6F .021 (SHEATH) ×3 IMPLANT
KIT HEART LEFT (KITS) ×3 IMPLANT
PACK CARDIAC CATHETERIZATION (CUSTOM PROCEDURE TRAY) ×3 IMPLANT
SYR MEDRAD MARK V 150ML (SYRINGE) ×3 IMPLANT
TRANSDUCER W/STOPCOCK (MISCELLANEOUS) ×3 IMPLANT
TUBING CIL FLEX 10 FLL-RA (TUBING) ×3 IMPLANT
WIRE SAFE-T 1.5MM-J .035X260CM (WIRE) ×3 IMPLANT

## 2015-12-16 NOTE — Interval H&P Note (Signed)
History and Physical Interval Note:  12/16/2015 3:35 PM  Samantha Turner  has presented today for surgery, with the diagnosis of ef 15%  The various methods of treatment have been discussed with the patient and family. After consideration of risks, benefits and other options for treatment, the patient has consented to  Procedure(s): Left Heart Cath and Coronary Angiography (N/A) as a surgical intervention .  The patient's history has been reviewed, patient examined, no change in status, stable for surgery.  I have reviewed the patient's chart and labs.  Questions were answered to the patient's satisfaction.     Lorine Bears

## 2015-12-16 NOTE — Progress Notes (Signed)
ANTICOAGULATION CONSULT NOTE - Initial Consult  Pharmacy Consult for Heparin Indication: atrial fibrillation  No Known Allergies  Patient Measurements: Height: 5\' 3"  (160 cm) (per patient) Weight: 244 lb 8 oz (110.904 kg) IBW/kg (Calculated) : 52.4 Heparin Dosing Weight: 79.2 kg  Vital Signs: Temp: 98 F (36.7 C) (07/12 0445) Temp Source: Oral (07/12 0445) BP: 112/72 mmHg (07/12 0445) Pulse Rate: 76 (07/12 0445)  Labs:  Recent Labs  12/14/15 1618 12/14/15 2123 12/15/15 0400 12/16/15 0322  HGB 12.2  --  12.3 12.7  HCT 38.8  --  40.3 40.9  PLT 355  --  337 380  CREATININE 0.92  --  0.90 1.03*  TROPONINI 0.03* <0.03 <0.03  --     Estimated Creatinine Clearance: 73 mL/min (by C-G formula based on Cr of 1.03).   Medical History: Past Medical History  Diagnosis Date  . Sinus tachycardia (HCC)   . Depression   . Anxiety   . Headache     Assessment: 55 yo F beginning UFH for AFib.  Goal of Therapy:  Heparin level 0.3-0.7 units/ml Monitor platelets by anticoagulation protocol: Yes   Plan:  Heparin 4,000 unit bolus x1 Heparin gtt 1,150 units/hr F/U heparin level 6 hr Monitor daily CBC and heparin level  Fredonia Highland, PharmD PGY-1 Pharmacy Resident Pager: [phone]

## 2015-12-16 NOTE — Progress Notes (Signed)
Patient refused CPAP while here in the hospital, RT will continue to monitor as needed.

## 2015-12-16 NOTE — Progress Notes (Signed)
PROGRESS NOTE    Samantha Turner  HQI:[id number] DOB: 20-Mar-1961 DOA: 12/14/2015 PCP: Kirstie Peri, MD   Brief Narrative:  Samantha Turner is a pleasant 55 year old female presented as a transfer from [facility] where she initially presented with complaints of chest pain, shortness of breath, palpitations, syncope. She had a CT of lungs done at that facility that revealed hilar adenopathy. She was transferred to this hospital for further workup. A transthoracic echocardiogram performed on 12/15/2015 showing severely reduced EF of 15-20%. Cardiology was consulted. Pulmonary medicine was also consulted regarding hilar adenopathy. Radiologists from outside facility included sarcoidosis and lymphoma in the differential diagnosis. Assessment & Plan   Acute combined systolic and diastolic congestive heart failure. -Samantha. Turner presenting as a transfer from outside hospital, initially presented with complaints of chest pain and shortness of breath -Troponins cycled and remained negative. -Transthoracic echocardiogram however revealed severely reduced ejection fraction of 15-20% with grade 2 diastolic dysfunction -Cardiology consulted and appreciated, needs cardiac catheterization vs Cardiac MRI  Chest pain -EKG at outside hospital did not show acute ischemic changes, will repeat an EKG here -Continue aspirin 81 mg by mouth daily along with statin -Treatment and plan as above  Atrial flutter -was started on Cardizem drip and transitioned to PO cardizem  q6h -CHADSVasc score of at least 3 -She would probably benefit from oral anticoagulation however will await cardiology's recommendations on this -Currently on heparin  Hilar adenopathy -CT scan of lungs performed at outside hospital revealing hilar adenopathy. Their radiologist included sarcoid and lymphoma in the differential. Previous hospitalist discussed case with Dr. Delton Coombes of pulmonary critical care medicine -Will likely address acute  cardiac issues prior to pulmonary work up.  -may need MRI of the chest  DVT Prophylaxis  heparin  Code Status: Full  Family Communication: Husband at bedside  Disposition Plan: Admitted for observation, pending cath  Consultants Cardiology Pulmonology   Procedures  Echocardiogram  Antibiotics   Anti-infectives    None      Subjective:   Samantha Turner seen and examined today.  No complaints today. Feels better as compared to previous days. Denies current chest pain, shortness of breath, abdominal pain, nausea, vomiting, dizziness, headache.  Objective:   Filed Vitals:   12/15/15 2022 12/15/15 2328 12/16/15 0445 12/16/15 1225  BP: 122/57 126/68 112/72 130/62  Pulse: 95 89 76 92  Temp: 98 F (36.7 C)  98 F (36.7 C)   TempSrc: Oral  Oral   Resp: 24  18   Height:   (1.6 m)    Weight:   110.904 kg (244 lb 8 oz)   SpO2: 98%  98%     Intake/Output Summary (Last 24 hours) at 12/16/15 1325 Last data filed at 12/16/15 0514  Gross per 24 hour  Intake    180 ml  Output   1300 ml  Net  -1120 ml   Filed Weights   12/16/15 0445  Weight: 110.904 kg (244 lb 8 oz)    Exam  General: Well developed, well nourished, NAD, appears stated age  HEENT: NCAT,  mucous membranes moist.   Cardiovascular: S1 S2 auscultated, irregular, no murmur  Respiratory: Clear to auscultation bilaterally with equal chest rise  Abdomen: Soft, nontender, nondistended, + bowel sounds  Extremities: warm dry without cyanosis clubbing or edema  Neuro: AAOx3, nonfocal  Psych: Normal affect and demeanor with intact judgement and insight   Data Reviewed: I have personally reviewed following labs and imaging studies  CBC:  Recent Labs Lab 12/14/15  1618 12/15/15 0400 12/16/15 0322  WBC 16.1* 16.1* 16.2*  HGB 12.2 12.3 12.7  HCT 38.8 40.3 40.9  MCV 81.2 81.6 81.2  PLT 355 337 380   Basic Metabolic Panel:  Recent Labs Lab 12/14/15 1618 12/15/15 0400 12/16/15 0322  NA 133*  139 137  K 4.5 4.7 4.4  CL 99* 101 101  CO2 25 28 28   GLUCOSE 152* 122* 118*  BUN 19 19 22*  CREATININE 0.92 0.90 1.03*  CALCIUM 9.7 9.8 9.3  MG 2.3  --   --    GFR: Estimated Creatinine Clearance: 73 mL/min (by C-G formula based on Cr of 1.03). Liver Function Tests:  Recent Labs Lab 12/14/15 1618 12/16/15 0322  AST 14* 14*  ALT 14 13*  ALKPHOS 91 87  BILITOT 0.6 0.7  PROT 8.0 7.4  ALBUMIN 3.9 3.8   No results for input(s): LIPASE, AMYLASE in the last 168 hours. No results for input(s): AMMONIA in the last 168 hours. Coagulation Profile:  Recent Labs Lab 12/16/15 1210  INR 1.05   Cardiac Enzymes:  Recent Labs Lab 12/14/15 1618 12/14/15 2123 12/15/15 0400  TROPONINI 0.03* <0.03 <0.03   BNP (last 3 results) No results for input(s): PROBNP in the last 8760 hours. HbA1C:  Recent Labs  12/15/15 0400  HGBA1C 5.7*   CBG: No results for input(s): GLUCAP in the last 168 hours. Lipid Profile:  Recent Labs  12/16/15 0322  CHOL 214*  HDL 25*  LDLCALC 109*  TRIG 399*  CHOLHDL 8.6   Thyroid Function Tests:  Recent Labs  12/15/15 0400  TSH 1.170   Anemia Panel: No results for input(s): VITAMINB12, FOLATE, FERRITIN, TIBC, IRON, RETICCTPCT in the last 72 hours. Urine analysis: No results found for: COLORURINE, APPEARANCEUR, LABSPEC, PHURINE, GLUCOSEU, HGBUR, BILIRUBINUR, KETONESUR, PROTEINUR, UROBILINOGEN, NITRITE, LEUKOCYTESUR Sepsis Labs: @LABRCNTIP (procalcitonin:4,lacticidven:4)  )No results found for this or any previous visit (from the past 240 hour(s)).    Radiology Studies: Dg Chest 2 View  12/15/2015  CLINICAL DATA:  Left chest pain and arm pain which radiates to patient's back. EXAM: CHEST  2 VIEW COMPARISON:  None. FINDINGS: The cardiac silhouette is normal. There is mild widening of the mediastinal contour with prominence of the contour of the ascending aorta. There is no evidence of focal airspace consolidation, pleural effusion or  pneumothorax. Osseous structures are without acute abnormality. Soft tissues are grossly normal. IMPRESSION: Mild widening of the mediastinal contour with prominence of the contour of the ascending aorta. This may represent overlapping vascular shadows, however aneurysmal dilation of the ascending aorta cannot be excluded with this appearance. CT angiogram of the chest may be considered for further evaluation. These results will be called to the ordering clinician or representative by the Radiologist Assistant, and communication documented in the PACS or zVision Dashboard. Electronically Signed   By: Ted Mcalpine M.D.   On: 12/15/2015 09:17     Scheduled Meds: . aspirin  81 mg Oral Daily  . atorvastatin  40 mg Oral q1800  . diltiazem  90 mg Oral Q6H  . lamoTRIgine  150 mg Oral Daily  . metoprolol tartrate  25 mg Oral Q6H  . sodium chloride flush  3 mL Intravenous Q12H  . venlafaxine  75 mg Oral BID   Continuous Infusions: . sodium chloride 10 mL/hr at 12/16/15 1000  . heparin 1,150 Units/hr (12/16/15 1248)     LOS: 2 days   Time Spent in minutes   30 minutes  Samantha Turner D.O. on  12/16/2015 at 1:25 PM  Between 7am to 7pm - Pager - [phone]  After 7pm go to [email or web address] - password [email or web address]  And look for the night coverage person covering for me after hours  [facility] Office  [phone]

## 2015-12-16 NOTE — H&P (View-Only) (Signed)
Patient ID: Samantha Turner MRN: [id number] DOB/AGE: [birth year] 55 y.o.  Admit date: 12/14/2015 Primary Physician Kirstie Peri, MD  Primary Cardiologist unassigned Duke Salvia)   Chief Complaint  Syncope, atrial flutter  HPI: Samantha Turner is a 55F with obesity and no other significant past medical history who presents as a transfer from [facility] after an episode of syncope and a subsequent diagnosis of new-onset systolic and diastolic dysfunction.  She reports that for the last several months she's had intermittent episodes of substernal chest tightness. This typically occurs with exertion and happens 2-3 times per week. Each episode lasts only for a few moments..  There is no associated nausea, vomiting, or diaphoresis. For the last month she has also noted exertional dyspnea. She denies lower extremity edema or weight gain. She has noted orthopnea, though this is not unchanged from her baseline. She reports heavy snoring and her husband notes that she sometimes stops breathing overnight. She does not feel well rested in the mornings but denies daytime somnolence.  On 7/6 Samantha Turner had an episode of syncope that occurred after sitting on the couch. She denies any preceding chest pain, shortness of breath, lightheadedness, dizziness, or palpitations. Her asthma reports that she sat on the couch and slumped to one side. She was out for approximately 1 minute and he does not know whether she was breathing at a time. Upon awakening she felt tired but was oriented. There is no loss of bowel or bladder function. Two days later she presented to the [facility] emergency department  Due to progressive shortness of breath. In the ED she received Lasix but did not have any clinical improvement. She also had a CT angiogram the chest that was negative for for pulmonary embolism but did note mediastinal and hilar lymphadenopathy. There is also a mediastinal mass measuring 3.8 x 3.5 x 5.9 cm in the subcarinal  mass. There was concern for sarcoidosis or lymphoma.  She was transferred to [facility] for additional workup.  Samantha Turner had an echo that revealed LVEF 15-20% with diffuse hypokinesis. There was akinesis of the inferior myocardium. She also had grade 2 diastolic dysfunction and a trivial pericardial effusion.  She was also noted to be in atrial flutter with rate in the 130s to 140s. She was started on a diltiazem infusion without improvement in her rates. Cardiology was consult is for newly diagnosed heart failure and atrial flutter.  Of note, Samantha Turner's mother died of congestive heart failure in her late 24s.  Review of Systems:   A 12 point review of systems was obtained and was negative with exceptions as noted in history of present illness.  Past Medical History  Diagnosis Date  . Sinus tachycardia (HCC)   . Depression   . Anxiety   . Headache     Medications Prior to Admission  Medication Sig Dispense Refill  . lamoTRIgine (LAMICTAL) 100 MG tablet Take 150 mg by mouth daily.     . naproxen sodium (ALEVE) 220 MG tablet Take 440 mg by mouth daily as needed (pain).    Marland Kitchen venlafaxine (EFFEXOR) 75 MG tablet Take 75 mg by mouth 2 (two) times daily.    Marland Kitchen HYDROcodone-acetaminophen (NORCO) 5-325 MG per tablet Take 1 tablet by mouth every 4 (four) hours as needed for moderate pain. (Patient not taking: Reported on 12/14/2015) 15 tablet 0  . meloxicam (MOBIC) 7.5 MG tablet 1 po bid with food (Patient not taking: Reported on 12/14/2015) 12 tablet 0     .  aspirin  81 mg Oral Daily  . atorvastatin  40 mg Oral q1800  . diltiazem  10 mg Intravenous Once  . enoxaparin (LOVENOX) injection  40 mg Subcutaneous Q24H  . lamoTRIgine  150 mg Oral Daily  . venlafaxine  75 mg Oral BID    Infusions: . diltiazem (CARDIZEM) infusion 15 mg/hr (12/15/15 1442)    No Known Allergies  Social History   Social History  . Marital Status: Single    Spouse Name: N/A  . Number of Children: N/A  . Years  of Education: N/A   Occupational History  . Not on file.   Social History Main Topics  . Smoking status: Never Smoker   . Smokeless tobacco: Never Used  . Alcohol Use: Yes     Comment: rarel  . Drug Use: No  . Sexual Activity: Yes    Birth Control/ Protection: Surgical   Other Topics Concern  . Not on file   Social History Narrative    History reviewed. No pertinent family history.  PHYSICAL EXAM: Filed Vitals:   12/15/15 1606 12/15/15 1627  BP: 120/80 116/81  Pulse:    Temp:    Resp:       Intake/Output Summary (Last 24 hours) at 12/15/15 1633 Last data filed at 12/14/15 1700  Gross per 24 hour  Intake    120 ml  Output      0 ml  Net    120 ml    General:  Well appearing. No respiratory difficulty HEENT: normal Neck: supple. no JVD. Carotids 2+ bilat; no bruits.  Cor: Tachycardic. Regular PM rhythm. I nondisplaced.  No rubs, gallops or murmurs. Lungs: clear to auscultation bilaterally. No crackles, rhonchi, or wheezes.  Abdomen: soft, nontender, nondistended. No hepatosplenomegaly. No bruits or masses. Good bowel sounds. Extremities: no cyanosis, clubbing, rash, edema Neuro: alert & oriented x 3, cranial nerves grossly intact. moves all 4 extremities w/o difficulty. Affect pleasant.  Results for orders placed or performed during the hospital encounter of 12/14/15 (from the past 24 hour(s))  Troponin I-serum (one time only)     Status: None   Collection Time: 12/14/15  9:23 PM  Result Value Ref Range   Troponin I <0.03 <0.03 ng/mL  Urine rapid drug screen (hosp performed)     Status: Abnormal   Collection Time: 12/14/15 10:07 PM  Result Value Ref Range   Opiates POSITIVE (A) NONE DETECTED   Cocaine NONE DETECTED NONE DETECTED   Benzodiazepines NONE DETECTED NONE DETECTED   Amphetamines NONE DETECTED NONE DETECTED   Tetrahydrocannabinol NONE DETECTED NONE DETECTED   Barbiturates NONE DETECTED NONE DETECTED  Troponin I-serum (one time only)     Status:  None   Collection Time: 12/15/15  4:00 AM  Result Value Ref Range   Troponin I <0.03 <0.03 ng/mL  Basic metabolic panel     Status: Abnormal   Collection Time: 12/15/15  4:00 AM  Result Value Ref Range   Sodium 139 135 - 145 mmol/L   Potassium 4.7 3.5 - 5.1 mmol/L   Chloride 101 101 - 111 mmol/L   CO2 28 22 - 32 mmol/L   Glucose, Bld 122 (H) 65 - 99 mg/dL   BUN 19 6 - 20 mg/dL   Creatinine, Ser 9.56 0.44 - 1.00 mg/dL   Calcium 9.8 8.9 - 21.3 mg/dL   GFR calc non Af Amer >60 >60 mL/min   GFR calc Af Amer >60 >60 mL/min   Anion gap 10 5 - 15  CBC     Status: Abnormal   Collection Time: 12/15/15  4:00 AM  Result Value Ref Range   WBC 16.1 (H) 4.0 - 10.5 K/uL   RBC 4.94 3.87 - 5.11 MIL/uL   Hemoglobin 12.3 12.0 - 15.0 g/dL   HCT 69.6 29.5 - 28.4 %   MCV 81.6 78.0 - 100.0 fL   MCH 24.9 (L) 26.0 - 34.0 pg   MCHC 30.5 30.0 - 36.0 g/dL   RDW 13.2 (H) 44.0 - 10.2 %   Platelets 337 150 - 400 K/uL  TSH     Status: None   Collection Time: 12/15/15  4:00 AM  Result Value Ref Range   TSH 1.170 0.350 - 4.500 uIU/mL   Dg Chest 2 View  12/15/2015  CLINICAL DATA:  Left chest pain and arm pain which radiates to patient's back. EXAM: CHEST  2 VIEW COMPARISON:  None. FINDINGS: The cardiac silhouette is normal. There is mild widening of the mediastinal contour with prominence of the contour of the ascending aorta. There is no evidence of focal airspace consolidation, pleural effusion or pneumothorax. Osseous structures are without acute abnormality. Soft tissues are grossly normal. IMPRESSION: Mild widening of the mediastinal contour with prominence of the contour of the ascending aorta. This may represent overlapping vascular shadows, however aneurysmal dilation of the ascending aorta cannot be excluded with this appearance. CT angiogram of the chest may be considered for further evaluation. These results will be called to the ordering clinician or representative by the Radiologist Assistant, and  communication documented in the PACS or zVision Dashboard. Electronically Signed   By: Ted Mcalpine M.D.   On: 12/15/2015 09:17    Echo 12/15/15: Study Conclusions  - Left ventricle: The cavity size was moderately dilated. Wall  thickness was normal. Systolic function was severely reduced. The  estimated ejection fraction was in the range of 15% to 20%.  Diffuse hypokinesis. Akinesis of the inferior myocardium. There  was fusion of early and atrial contributions to ventricular  filling. Features are consistent with a pseudonormal left  ventricular filling pattern, with concomitant abnormal relaxation  and increased filling pressure (grade 2 diastolic dysfunction). - Mitral valve: There was mild to moderate regurgitation. - Left atrium: The atrium was mildly dilated. - Right atrium: The atrium was mildly dilated. - Pericardium, extracardiac: A trivial pericardial effusion was  identified.  ECG:  typical atrial flutter with 2:1 AV conduction delay.  Ventricular rate 137. Right axis deviation concerning for RVH versus less likely prior posterior infarct.   ASSESSMENT/PLAN:  # Atrial flutter: Heart rate has been very poorly-controlled on the diltiazem infusion. We recommended giving metoprolol 2.5 mg IV times one which seems to have improved her heart rate. We will start metoprolol tartrate 12.5 mg every 6 hours. Ideally, she would not be on diltiazem given her LV dysfunction. We will try to increase the metoprolol and use the diltiazem. Given her severe LV dysfunction we will plan for transesophageal echo with direct current cardioversion this admission if she does not convert on her own.  We will start heparin for anticoagulation with plans to convert to oral anticoagulation after cardiac catheterization.This patients CHA2DS2-VASc Score and unadjusted Ischemic Stroke Rate (% per year) is equal to 2.2 % stroke rate/year from a score of 2  Above score calculated as 1 point each  if present [CHF, HTN, DM, Vascular=MI/PAD/Aortic Plaque, Age if 65-74, or Female] Above score calculated as 2 points each if present [Age > 75, or Stroke/TIA/TE]   #  Acute systolic and diastolic heart failure: Newly diagnosed this admission. Clinically she does not appear to be volume overloaded. Given the finding of lymphadenopathy and focal wall motion abnormalities, this is concerning for a presentation of sarcoidosis. It is also possible that it could be due to ischemia. Once her rhythm has settled down we will need to pursue cardiac catheterization and possibly cardiac MRI if no ischemia is noted on cath.   Signed: Martavious Hartel C. Duke Salvia, MD, [facility]  12/15/2015, 4:33 PM

## 2015-12-16 NOTE — Progress Notes (Signed)
   Name: Samantha Turner MRN: [id number] DOB: [birth year]    ADMISSION DATE:  12/14/2015 CONSULTATION DATE:  7/11  REFERRING MD :  Dr. Vanessa Barbara [facility]  CHIEF COMPLAINT:  SOB/Hilar LAN  SUBJECTIVE:  No complaints  VITAL SIGNS: Temp:  [97.3 F (36.3 C)-98 F (36.7 C)] 98 F (36.7 C) (07/12 0445) Pulse Rate:  [76-136] 76 (07/12 0445) Resp:  [18-24] 18 (07/12 0445) BP: (105-145)/(57-94) 112/72 mmHg (07/12 0445) SpO2:  [98 %-99 %] 98 % (07/12 0445) Weight:  [244 lb 8 oz (110.904 kg)] 244 lb 8 oz (110.904 kg) (07/12 0445) Room air  PHYSICAL EXAMINATION: General:  Morbidly obese female in NAD Neuro:  Alert, oriented, non-focal HEENT:  [place]/AT, PERRL, unable to appreciate JVD Cardiovascular:  Tachy, IRIR, no MRG Lungs:  Clear bilateral breath sounds, no accessory use  Abdomen:  Soft, non-tender, non-distended Musculoskeletal:  No acute deformity, trace (if any) peripheral edema Skin:  Grossly intact.   Recent Labs Lab 12/14/15 1618 12/15/15 0400 12/16/15 0322  NA 133* 139 137  K 4.5 4.7 4.4  CL 99* 101 101  CO2 25 28 28   BUN 19 19 22*  CREATININE 0.92 0.90 1.03*  GLUCOSE 152* 122* 118*    Recent Labs Lab 12/14/15 1618 12/15/15 0400 12/16/15 0322  HGB 12.2 12.3 12.7  HCT 38.8 40.3 40.9  WBC 16.1* 16.1* 16.2*  PLT 355 337 380   Dg Chest 2 View  12/15/2015  CLINICAL DATA:  Left chest pain and arm pain which radiates to patient's back. EXAM: CHEST  2 VIEW COMPARISON:  None. FINDINGS: The cardiac silhouette is normal. There is mild widening of the mediastinal contour with prominence of the contour of the ascending aorta. There is no evidence of focal airspace consolidation, pleural effusion or pneumothorax. Osseous structures are without acute abnormality. Soft tissues are grossly normal. IMPRESSION: Mild widening of the mediastinal contour with prominence of the contour of the ascending aorta. This may represent overlapping vascular shadows, however aneurysmal dilation of the  ascending aorta cannot be excluded with this appearance. CT angiogram of the chest may be considered for further evaluation. These results will be called to the ordering clinician or representative by the Radiologist Assistant, and communication documented in the PACS or zVision Dashboard. Electronically Signed   By: Ted Mcalpine M.D.   On: 12/15/2015 09:17    ASSESSMENT / PLAN:   Mediastinal Mass: likely bronchogenic cyst.  Subcarinal LAN - would get MRI chest at some point. With pending cardiac cath may not need cardiac MRI at this point   Obstructive sleep apnea (probable) - Nocturnal CPAP 8cmH2O - Will need formal polysomnogram as outpatient - Pulmonary hygiene with incentive spirometry per RT protocol.   Chronic systolic CHF with LVEF 15-20% Atrial fibrillation with RVR -for LHC today   Simonne Martinet ACNP-BC [facility] Pulmonary/Critical Care Pager # [phone] OR # [phone] if no answer   12/16/2015 12:21 PM

## 2015-12-16 NOTE — Progress Notes (Signed)
Turner Problem List     Principal Problem:   Acute combined systolic and diastolic congestive heart failure (HCC) Active Problems:   Chest pain   Depression   Hilar adenopathy   Sinus tachycardia Samantha Turner)    Patient Profile:   Primary Cardiologist: New - Dr. Duke Salvia  69F with obesity and no other significant PMH who presented as a transfer from [facility] on 12/14/2015 after an episode of syncope and a subsequent diagnosis of new-onset systolic and diastolic dysfunction (EF 15-20% by echo).   Subjective   Reports mild episodes of chest discomfort overnight. No dyspnea. HR improved in the 70's - 80's.   Inpatient Medications    . aspirin  81 mg Oral Daily  . atorvastatin  40 mg Oral q1800  . diltiazem  10 mg Intravenous Once  . enoxaparin (LOVENOX) injection  40 mg Subcutaneous Q24H  . lamoTRIgine  150 mg Oral Daily  . metoprolol tartrate  12.5 mg Oral Q6H  . venlafaxine  75 mg Oral BID    Vital Signs    Filed Vitals:   12/15/15 1717 12/15/15 2022 12/15/15 2328 12/16/15 0445  BP: 105/83 122/57 126/68 112/72  Pulse:  95 89 76  Temp:  98 F (36.7 C)  98 F (36.7 C)  TempSrc:  Oral  Oral  Resp:  24  18  Height:   5\' 3"  (1.6 m)   Weight:    244 lb 8 oz (110.904 kg)  SpO2:  98%  98%    Intake/Output Summary (Last 24 hours) at 12/16/15 0909 Last data filed at 12/16/15 0514  Gross per 24 hour  Intake    180 ml  Output   1300 ml  Net  -1120 ml   Filed Weights   12/16/15 0445  Weight: 244 lb 8 oz (110.904 kg)    Physical Exam    General: Well developed, well nourished, female appearing in no acute distress. Head: Normocephalic, atraumatic.  Neck: Supple without bruits, JVD not elevated. Lungs:  Resp regular and unlabored, CTA without wheezing or rales. Heart: Irregularly irregular, S1, S2, no S3, S4, or murmur; no rub. Abdomen: Soft, non-tender, non-distended with normoactive bowel sounds. No hepatomegaly. No rebound/guarding. No obvious abdominal  masses. Extremities: No clubbing, cyanosis, or edema. Distal pedal pulses are 2+ bilaterally. Neuro: Alert and oriented X 3. Moves all extremities spontaneously. Psych: Normal affect.  Labs    CBC  Recent Labs  12/15/15 0400 12/16/15 0322  WBC 16.1* 16.2*  HGB 12.3 12.7  HCT 40.3 40.9  MCV 81.6 81.2  PLT 337 380   Basic Metabolic Panel  Recent Labs  12/14/15 1618 12/15/15 0400 12/16/15 0322  NA 133* 139 137  K 4.5 4.7 4.4  CL 99* 101 101  CO2 25 28 28   GLUCOSE 152* 122* 118*  BUN 19 19 22*  CREATININE 0.92 0.90 1.03*  CALCIUM 9.7 9.8 9.3  MG 2.3  --   --    Liver Function Tests  Recent Labs  12/14/15 1618 12/16/15 0322  AST 14* 14*  ALT 14 13*  ALKPHOS 91 87  BILITOT 0.6 0.7  PROT 8.0 7.4  ALBUMIN 3.9 3.8   No results for input(s): LIPASE, AMYLASE in the last 72 hours. Cardiac Enzymes  Recent Labs  12/14/15 1618 12/14/15 2123 12/15/15 0400  TROPONINI 0.03* <0.03 <0.03   BNP Invalid input(s): POCBNP D-Dimer No results for input(s): DDIMER in the last 72 hours. Hemoglobin A1C  Recent Labs  12/15/15 0400  HGBA1C 5.7*   Fasting Lipid Panel  Recent Labs  12/16/15 0322  CHOL 214*  HDL 25*  LDLCALC 109*  TRIG 399*  CHOLHDL 8.6   Thyroid Function Tests  Recent Labs  12/15/15 0400  TSH 1.170    Telemetry    Atrial flutter, HR in the 70's - 80's.   ECG    No new tracings.   Cardiac Studies and Radiology    Dg Chest 2 View: 12/15/2015  CLINICAL DATA:  Left chest pain and arm pain which radiates to patient's back. EXAM: CHEST  2 VIEW COMPARISON:  Samantha Turner. FINDINGS: The cardiac silhouette is normal. There is mild widening of the mediastinal contour with prominence of the contour of the ascending aorta. There is no evidence of focal airspace consolidation, pleural effusion or pneumothorax. Osseous structures are without acute abnormality. Soft tissues are grossly normal. IMPRESSION: Mild widening of the mediastinal contour with  prominence of the contour of the ascending aorta. This may represent overlapping vascular shadows, however aneurysmal dilation of the ascending aorta cannot be excluded with this appearance. CT angiogram of the chest may be considered for further evaluation. These results will be called to the ordering clinician or representative by the Radiologist Assistant, and communication documented in the PACS or zVision Dashboard. Electronically Signed   By: Ted Mcalpine M.D.   On: 12/15/2015 09:17    Echocardiogram: 12/15/2015 Study Conclusions  - Left ventricle: The cavity size was moderately dilated. Wall  thickness was normal. Systolic function was severely reduced. The  estimated ejection fraction was in the range of 15% to 20%.  Diffuse hypokinesis. Akinesis of the inferior myocardium. There  was fusion of early and atrial contributions to ventricular  filling. Features are consistent with a pseudonormal left  ventricular filling pattern, with concomitant abnormal relaxation  and increased filling pressure (grade 2 diastolic dysfunction). - Mitral valve: There was mild to moderate regurgitation. - Left atrium: The atrium was mildly dilated. - Right atrium: The atrium was mildly dilated. - Pericardium, extracardiac: A trivial pericardial effusion was  identified.  Assessment & Plan    1. Atrial flutter - Heart rate has been very poorly-controlled on the diltiazem infusion (at 15 mg/hr). Started on PO Metoprolol Tartrate 12.5 mg every 6 hours on 12/15/2015. Would ideally like to not use Diltiazem long-term with her significantly reduced EF. HR now improved into the 70's - 80's with Lopressor and IV Diltiazem.  - will require TEE-guided DCCV if she does not convert on her own.  - This patients CHA2DS2-VASc Score and unadjusted Ischemic Stroke Rate (% per year) is equal to 2.2 % stroke rate/year from a score of 2 (CHF, Female). Started on Heparin for anticoagulation with plans to  convert to oral anticoagulation after cardiac catheterization.  2. Acute systolic and diastolic heart failure - Newly diagnosed this admission by echo with EF of 15-20% with diffuse hypokinesis and akinesis of the inferior myocardium. Clinically she does not appear to be volume overloaded. Given the finding of lymphadenopathy and focal wall motion abnormalities, this is concerning for a presentation of sarcoidosis.  - It is also possible that it could be due to ischemia. Once her rhythm has settled down we will need to pursue cardiac catheterization and possibly cardiac MRI if no ischemia is noted on cath. - HR better controlled this AM. Did have episodes of chest discomfort overnight. Discussed with Dr. Duke Salvia, will add to cath board this afternoon. (Had breakfast so cath will be after 1300). The patient  understands that risks included but are not limited to stroke (1 in 1000), death (1 in 1000), kidney failure [usually temporary] (1 in 500), bleeding (1 in 200), allergic reaction [possibly serious] (1 in 200).  - continue Heparin, ASA, statin, and BB.    Signed, Ellsworth Lennox , PA-C 9:09 AM 12/16/2015 Pager: [phone]

## 2015-12-17 ENCOUNTER — Encounter (HOSPITAL_COMMUNITY): Payer: Self-pay | Admitting: Cardiovascular Disease

## 2015-12-17 DIAGNOSIS — Q330 Congenital cystic lung: Secondary | ICD-10-CM | POA: Diagnosis not present

## 2015-12-17 DIAGNOSIS — I110 Hypertensive heart disease with heart failure: Secondary | ICD-10-CM | POA: Diagnosis present

## 2015-12-17 DIAGNOSIS — E669 Obesity, unspecified: Secondary | ICD-10-CM | POA: Diagnosis present

## 2015-12-17 DIAGNOSIS — I4891 Unspecified atrial fibrillation: Secondary | ICD-10-CM | POA: Diagnosis present

## 2015-12-17 DIAGNOSIS — F319 Bipolar disorder, unspecified: Secondary | ICD-10-CM | POA: Diagnosis present

## 2015-12-17 DIAGNOSIS — G4733 Obstructive sleep apnea (adult) (pediatric): Secondary | ICD-10-CM | POA: Diagnosis present

## 2015-12-17 DIAGNOSIS — F329 Major depressive disorder, single episode, unspecified: Secondary | ICD-10-CM | POA: Diagnosis not present

## 2015-12-17 DIAGNOSIS — I483 Typical atrial flutter: Secondary | ICD-10-CM | POA: Diagnosis not present

## 2015-12-17 DIAGNOSIS — Z791 Long term (current) use of non-steroidal anti-inflammatories (NSAID): Secondary | ICD-10-CM | POA: Diagnosis not present

## 2015-12-17 DIAGNOSIS — J984 Other disorders of lung: Secondary | ICD-10-CM | POA: Diagnosis present

## 2015-12-17 DIAGNOSIS — R079 Chest pain, unspecified: Secondary | ICD-10-CM | POA: Diagnosis not present

## 2015-12-17 DIAGNOSIS — I5043 Acute on chronic combined systolic (congestive) and diastolic (congestive) heart failure: Secondary | ICD-10-CM | POA: Diagnosis present

## 2015-12-17 DIAGNOSIS — I428 Other cardiomyopathies: Secondary | ICD-10-CM | POA: Diagnosis present

## 2015-12-17 DIAGNOSIS — I429 Cardiomyopathy, unspecified: Secondary | ICD-10-CM | POA: Diagnosis not present

## 2015-12-17 DIAGNOSIS — I340 Nonrheumatic mitral (valve) insufficiency: Secondary | ICD-10-CM | POA: Diagnosis not present

## 2015-12-17 DIAGNOSIS — I5041 Acute combined systolic (congestive) and diastolic (congestive) heart failure: Secondary | ICD-10-CM | POA: Diagnosis not present

## 2015-12-17 DIAGNOSIS — K219 Gastro-esophageal reflux disease without esophagitis: Secondary | ICD-10-CM | POA: Diagnosis present

## 2015-12-17 DIAGNOSIS — I4892 Unspecified atrial flutter: Secondary | ICD-10-CM | POA: Diagnosis present

## 2015-12-17 DIAGNOSIS — R590 Localized enlarged lymph nodes: Secondary | ICD-10-CM | POA: Diagnosis present

## 2015-12-17 DIAGNOSIS — R55 Syncope and collapse: Secondary | ICD-10-CM | POA: Diagnosis present

## 2015-12-17 DIAGNOSIS — Z7952 Long term (current) use of systemic steroids: Secondary | ICD-10-CM | POA: Diagnosis not present

## 2015-12-17 LAB — CBC
HEMATOCRIT: 37.9 % (ref 36.0–46.0)
Hemoglobin: 11.9 g/dL — ABNORMAL LOW (ref 12.0–15.0)
MCH: 25.1 pg — ABNORMAL LOW (ref 26.0–34.0)
MCHC: 31.4 g/dL (ref 30.0–36.0)
MCV: 80 fL (ref 78.0–100.0)
PLATELETS: 336 10*3/uL (ref 150–400)
RBC: 4.74 MIL/uL (ref 3.87–5.11)
RDW: 16.3 % — AB (ref 11.5–15.5)
WBC: 13.2 10*3/uL — AB (ref 4.0–10.5)

## 2015-12-17 LAB — BASIC METABOLIC PANEL
ANION GAP: 9 (ref 5–15)
BUN: 18 mg/dL (ref 6–20)
CALCIUM: 8.9 mg/dL (ref 8.9–10.3)
CO2: 22 mmol/L (ref 22–32)
Chloride: 104 mmol/L (ref 101–111)
Creatinine, Ser: 0.86 mg/dL (ref 0.44–1.00)
GLUCOSE: 106 mg/dL — AB (ref 65–99)
POTASSIUM: 3.8 mmol/L (ref 3.5–5.1)
Sodium: 135 mmol/L (ref 135–145)

## 2015-12-17 LAB — SEDIMENTATION RATE: Sed Rate: 29 mm/hr — ABNORMAL HIGH (ref 0–22)

## 2015-12-17 MED ORDER — DILTIAZEM HCL 60 MG PO TABS
90.0000 mg | ORAL_TABLET | Freq: Two times a day (BID) | ORAL | Status: DC
  Administered 2015-12-17 – 2015-12-18 (×2): 90 mg via ORAL
  Filled 2015-12-17 (×2): qty 1

## 2015-12-17 MED ORDER — SODIUM CHLORIDE 0.9 % IV SOLN
INTRAVENOUS | Status: DC
  Administered 2015-12-17 – 2015-12-18 (×3): via INTRAVENOUS

## 2015-12-17 MED ORDER — METOPROLOL TARTRATE 50 MG PO TABS
50.0000 mg | ORAL_TABLET | Freq: Four times a day (QID) | ORAL | Status: DC
  Administered 2015-12-17 – 2015-12-18 (×3): 50 mg via ORAL
  Filled 2015-12-17 (×3): qty 1

## 2015-12-17 NOTE — Progress Notes (Signed)
Patient Name: Samantha Turner Date of Encounter: 12/17/2015  Principal Problem:   Acute combined systolic and diastolic congestive heart failure (HCC) Active Problems:   Chest pain   Depression   Hilar adenopathy   Sinus tachycardia (HCC)   Atrial flutter (HCC)   Bronchogenic cyst   Primary Cardiologist: Dr. Duke Salvia Patient Profile: 38F with obesity and no other significant PMH who presented as a transfer from [facility] on 12/14/2015 after an episode of syncope and a subsequent diagnosis of new-onset systolic and diastolic dysfunction (EF 15-20% by echo).   SUBJECTIVE: Feels well, denies chest pain and SOB. Denies orthopnea.    OBJECTIVE Filed Vitals:   12/16/15 2030 12/16/15 2254 12/17/15 0330 12/17/15 0540  BP: 101/60 107/70 109/61   Pulse: 79 114 70 100  Temp: 97.9 F (36.6 C)  98 F (36.7 C)   TempSrc: Oral  Oral   Resp:  20 20   Height:      Weight:   241 lb 4.8 oz (109.453 kg)   SpO2: 98% 97% 99%     Intake/Output Summary (Last 24 hours) at 12/17/15 1146 Last data filed at 12/17/15 0835  Gross per 24 hour  Intake    240 ml  Output      0 ml  Net    240 ml   Filed Weights   12/16/15 0445 12/17/15 0330  Weight: 244 lb 8 oz (110.904 kg) 241 lb 4.8 oz (109.453 kg)    PHYSICAL EXAM General: Well developed, well nourished, female in no acute distress. Head: Normocephalic, atraumatic.  Neck: Supple without bruits, No JVD. Lungs:  Resp regular and unlabored, CTA. Heart: IRRR, S1, S2, no S3, S4, or murmur; no rub. Abdomen: Soft, non-tender, non-distended, BS + x 4.  Extremities: No clubbing, cyanosis, +1 pretibial edema.  Neuro: Alert and oriented X 3. Moves all extremities spontaneously. Psych: Normal affect.  LABS: CBC: Recent Labs  12/16/15 0322 12/17/15 0445  WBC 16.2* 13.2*  HGB 12.7 11.9*  HCT 40.9 37.9  MCV 81.2 80.0  PLT 380 336   INR: Recent Labs  12/16/15 1210  INR 1.05   Basic Metabolic Panel: Recent Labs   96/04/54 1618  12/16/15 0322 12/17/15 0445  NA 133*  < > 137 135  K 4.5  < > 4.4 3.8  CL 99*  < > 101 104  CO2 25  < > 28 22  GLUCOSE 152*  < > 118* 106*  BUN 19  < > 22* 18  CREATININE 0.92  < > 1.03* 0.86  CALCIUM 9.7  < > 9.3 8.9  MG 2.3  --   --   --   < > = values in this interval not displayed. Liver Function Tests: Recent Labs  12/14/15 1618 12/16/15 0322  AST 14* 14*  ALT 14 13*  ALKPHOS 91 87  BILITOT 0.6 0.7  PROT 8.0 7.4  ALBUMIN 3.9 3.8   Cardiac Enzymes: Recent Labs  12/14/15 1618 12/14/15 2123 12/15/15 0400  TROPONINI 0.03* <0.03 <0.03   Hemoglobin A1C: Recent Labs  12/15/15 0400  HGBA1C 5.7*   Fasting Lipid Panel: Recent Labs  12/16/15 0322  CHOL 214*  HDL 25*  LDLCALC 109*  TRIG 399*  CHOLHDL 8.6   Thyroid Function Tests: Recent Labs  12/15/15 0400  TSH 1.170     Current facility-administered medications:  .  0.9 %  sodium chloride infusion, 250 mL, Intravenous, PRN, Iran Ouch, MD .  acetaminophen (TYLENOL) tablet  650 mg, 650 mg, Oral, Q4H PRN, Dorothea Ogle, MD .  apixaban [facility]) tablet 5 mg, 5 mg, Oral, BID, Iran Ouch, MD, 5 mg at 12/17/15 0854 .  atorvastatin (LIPITOR) tablet 40 mg, 40 mg, Oral, q1800, Jeralyn Bennett, MD, 40 mg at 12/16/15 1740 .  diltiazem (CARDIZEM) tablet 90 mg, 90 mg, Oral, Q6H, Chilton Si, MD, 90 mg at 12/17/15 0541 .  HYDROcodone-acetaminophen (NORCO/VICODIN) 5-325 MG per tablet 1 tablet, 1 tablet, Oral, Q4H PRN, Dorothea Ogle, MD .  lamoTRIgine (LAMICTAL) tablet 150 mg, 150 mg, Oral, Daily, Dorothea Ogle, MD, 150 mg at 12/17/15 0854 .  metoprolol (LOPRESSOR) injection 5 mg, 5 mg, Intravenous, Q6H PRN, Dorothea Ogle, MD .  metoprolol tartrate (LOPRESSOR) tablet 25 mg, 25 mg, Oral, Q6H, Chilton Si, MD, 25 mg at 12/17/15 0540 .  nitroGLYCERIN (NITROSTAT) SL tablet 0.4 mg, 0.4 mg, Sublingual, Q5 min PRN, Jeralyn Bennett, MD .  ondansetron [facility]) injection 4 mg, 4 mg,  Intravenous, Q6H PRN, Dorothea Ogle, MD .  sodium chloride flush (NS) 0.9 % injection 3 mL, 3 mL, Intravenous, Q12H, Iran Ouch, MD, 3 mL at 12/16/15 2047 .  sodium chloride flush (NS) 0.9 % injection 3 mL, 3 mL, Intravenous, PRN, Iran Ouch, MD .  venlafaxine [facility]) tablet 75 mg, 75 mg, Oral, BID, Dorothea Ogle, MD, 75 mg at 12/17/15 1610    TELE: Atrial flutter       Left Heart Cath and Coronary Angiography 12/16/15  There is severe left ventricular systolic dysfunction.  1. Normal coronary arteries. The LAD and left circumflex has 2 separate ostium. 2. Dilated left ventricle with severely reduced LV systolic function. mildly elevated left ventricular end-diastolic pressure.  Recommendations:  The patient has nonischemic cardiomyopathy likely tachycardia induced. I agree with rate control, anticoagulation and proceeding with TEE guided cardioversion. Oral anticoagulation with a NOAC can be started tomorrow morning.   Current Medications:  . apixaban  5 mg Oral BID  . atorvastatin  40 mg Oral q1800  . diltiazem  90 mg Oral Q6H  . lamoTRIgine  150 mg Oral Daily  . metoprolol tartrate  25 mg Oral Q6H  . sodium chloride flush  3 mL Intravenous Q12H  . venlafaxine  75 mg Oral BID      ASSESSMENT AND PLAN: Principal Problem:   Acute combined systolic and diastolic congestive heart failure (HCC) Active Problems:   Chest pain   Depression   Hilar adenopathy   Sinus tachycardia (HCC)   Atrial flutter (HCC)   Bronchogenic cyst  1. Atrial flutter - Mostly rate controlled with rates in the 80-100's has some spikes up to 120's. She is on PO Metoprolol Tartrate 25 mg every 6 hours. Would ideally like to not use Diltiazem long-term with her significantly reduced EF.  - TEE-guided DCCV scheduled for tomorrow.    - This patients CHA2DS2-VASc Score and unadjusted Ischemic Stroke Rate (% per year) is equal to 2.2 % stroke rate/year from a score of 2 (CHF, Female).  Started on Heparin for anticoagulation with plans to convert to oral anticoagulation after DCCV.   2. Acute systolic and diastolic heart failure - Newly diagnosed this admission by echo with EF of 15-20% with diffuse hypokinesis and akinesis of the inferior myocardium. Clinically she does not appear to be volume overloaded. Given the finding of lymphadenopathy and focal wall motion abnormalities, this is concerning for a presentation of sarcoidosis.   - Left heart cath yesterday  showed normal coronary arteries, consistent with nonischemic cardiomyopathy that could be tachycardia induced.   - continue Heparin, ASA, statin, and BB.     Signed, Little Ishikawa , NP 11:46 AM 12/17/2015 Pager [phone]

## 2015-12-17 NOTE — Progress Notes (Signed)
Patient sitting on side of bed, no needs at this time. Call light within reach. 

## 2015-12-17 NOTE — Progress Notes (Signed)
Patient continues to refuse CPAP 

## 2015-12-17 NOTE — Progress Notes (Addendum)
PROGRESS NOTE    [name]  ETK:[id number] DOB: 01-25-1961 DOA: 12/14/2015 PCP: Kirstie Peri, MD   Brief Narrative:  Samantha Turner is a pleasant 55 year old female presented as a transfer from [facility] where she initially presented with complaints of chest pain, shortness of breath, palpitations, syncope. She had a CT of lungs done at that facility that revealed hilar adenopathy. She was transferred to this hospital for further workup. A transthoracic echocardiogram performed on 12/15/2015 showing severely reduced EF of 15-20%. Cardiology was consulted. Pulmonary medicine was also consulted regarding hilar adenopathy. Radiologists from outside facility included sarcoidosis and lymphoma in the differential diagnosis. Assessment & Plan   Acute combined systolic and diastolic congestive heart failure/Nonischemic Cardiomyopathy -Samantha. Baysinger presenting as a transfer from outside hospital, initially presented with complaints of chest pain and shortness of breath -Troponins cycled and remained negative. -Transthoracic echocardiogram however revealed severely reduced ejection fraction of 15-20% with grade 2 diastolic dysfunction -Cardiology consulted and appreciated,s/p cardiac catheterization: normal coronaries. Nonischemic cardiomyopathy likely tachycardia induced.    Chest pain -EKG at outside hospital did not show acute ischemic changes, will repeat an EKG here -Continue aspirin 81 mg by mouth daily along with statin -Treatment and plan as above  Atrial flutter -was started on Cardizem drip and transitioned to PO cardizem 90mg  q6h -CHADSVasc score of at least 3 -Initially placed on heparin, transitioned to Eliquis -pending TEE/DCCV  Hilar adenopathy -CT scan of lungs performed at outside hospital revealing hilar adenopathy. Their radiologist included sarcoid and lymphoma in the differential. Previous hospitalist discussed case with Dr. Delton Coombes of pulmonary critical care medicine -Will  likely address acute cardiac issues prior to pulmonary work up.  -may need MRI of the chest  Depression -Continue lamictal and effexor  Diarrhea -Possibly due to liquid diet -will continue to monitor. No recent antibiotics  DVT Prophylaxis  Eliquis  Code Status: Full  Family Communication: Husband at bedside  Disposition Plan: Admitted for observation, pending TEE/DCCV  Consultants Cardiology Pulmonology   Procedures  Echocardiogram Cardiac catheterization  Antibiotics   Anti-infectives    None      Subjective:   Samantha Turner seen and examined today.  No complaints today. Feels improved compared to previous days. Denies current chest pain, shortness of breath, abdominal pain, nausea, vomiting, dizziness, headache.  Objective:   Filed Vitals:   12/16/15 2030 12/16/15 2254 12/17/15 0330 12/17/15 0540  BP: 101/60 107/70 109/61   Pulse: 79 114 70 100  Temp: 97.9 F (36.6 C)  98 F (36.7 C)   TempSrc: Oral  Oral   Resp:  20 20   Height:      Weight:   109.453 kg (241 lb 4.8 oz)   SpO2: 98% 97% 99%     Intake/Output Summary (Last 24 hours) at 12/17/15 1114 Last data filed at 12/17/15 0835  Gross per 24 hour  Intake    240 ml  Output      0 ml  Net    240 ml   Filed Weights   12/16/15 0445 12/17/15 0330  Weight: 110.904 kg (244 lb 8 oz) 109.453 kg (241 lb 4.8 oz)    Exam  General: Well developed, well nourished, NAD  HEENT: NCAT,  mucous membranes moist.   Cardiovascular: S1 S2 auscultated, irregular, no murmur  Respiratory: Clear to auscultation bilaterally with equal chest rise  Abdomen: Soft, obese, nontender, nondistended, + bowel sounds  Extremities: warm dry without cyanosis clubbing or edema  Neuro: AAOx3, nonfocal  Psych: Normal affect  and demeanor, pleasant  Data Reviewed: I have personally reviewed following labs and imaging studies  CBC:  Recent Labs Lab 12/14/15 1618 12/15/15 0400 12/16/15 0322 12/17/15 0445  WBC 16.1*  16.1* 16.2* 13.2*  HGB 12.2 12.3 12.7 11.9*  HCT 38.8 40.3 40.9 37.9  MCV 81.2 81.6 81.2 80.0  PLT 355 337 380 336   Basic Metabolic Panel:  Recent Labs Lab 12/14/15 1618 12/15/15 0400 12/16/15 0322 12/17/15 0445  NA 133* 139 137 135  K 4.5 4.7 4.4 3.8  CL 99* 101 101 104  CO2 GLUCOSE 152* 122* 118* 106*  BUN 19 19 22* 18  CREATININE 0.92 0.90 1.03* 0.86  CALCIUM 9.7 9.8 9.3 8.9  MG 2.3  --   --   --    GFR: Estimated Creatinine Clearance: 86.7 mL/min (by C-G formula based on Cr of 0.86). Liver Function Tests:  Recent Labs Lab 12/14/15 1618 12/16/15 0322  AST 14* 14*  ALT 14 13*  ALKPHOS 91 87  BILITOT 0.6 0.7  PROT 8.0 7.4  ALBUMIN 3.9 3.8   No results for input(s): LIPASE, AMYLASE in the last 168 hours. No results for input(s): AMMONIA in the last 168 hours. Coagulation Profile:  Recent Labs Lab 12/16/15 1210  INR 1.05   Cardiac Enzymes:  Recent Labs Lab 12/14/15 1618 12/14/15 2123 12/15/15 0400  TROPONINI 0.03* <0.03 <0.03   BNP (last 3 results) No results for input(s): PROBNP in the last 8760 hours. HbA1C:  Recent Labs  12/15/15 0400  HGBA1C 5.7*   CBG: No results for input(s): GLUCAP in the last 168 hours. Lipid Profile:  Recent Labs  12/16/15 0322  CHOL 214*  HDL 25*  LDLCALC 109*  TRIG 399*  CHOLHDL 8.6   Thyroid Function Tests:  Recent Labs  12/15/15 0400  TSH 1.170   Anemia Panel: No results for input(s): VITAMINB12, FOLATE, FERRITIN, TIBC, IRON, RETICCTPCT in the last 72 hours. Urine analysis: No results found for: COLORURINE, APPEARANCEUR, LABSPEC, PHURINE, GLUCOSEU, HGBUR, BILIRUBINUR, KETONESUR, PROTEINUR, UROBILINOGEN, NITRITE, LEUKOCYTESUR Sepsis Labs: (procalcitonin:4,lacticidven:4)  )No results found for this or any previous visit (from the past 240 hour(s)).    Radiology Studies: No results found.   Scheduled Meds: . apixaban  5 mg Oral BID  . atorvastatin  40 mg Oral q1800    . diltiazem  90 mg Oral Q6H  . lamoTRIgine  150 mg Oral Daily  . metoprolol tartrate  25 mg Oral Q6H  . sodium chloride flush  3 mL Intravenous Q12H  . venlafaxine  75 mg Oral BID   Continuous Infusions:     LOS: 3 days   Time Spent in minutes   30 minutes  Fletcher Rathbun D.O. on 12/17/2015 at 11:14 AM  Between 7am to 7pm - Pager - [phone]  After 7pm go to [email or web address] - password [email or web address]  And look for the night coverage person covering for me after hours  [facility] Office  [phone]

## 2015-12-17 NOTE — Progress Notes (Signed)
Heart Failure Navigator Consult Note  Presentation: Samantha Turner is a 55F with obesity and no other significant past medical history who presents as a transfer from [facility] after an episode of syncope and a subsequent diagnosis of new-onset systolic and diastolic dysfunction. She reports that for the last several months she's had intermittent episodes of substernal chest tightness. This typically occurs with exertion and happens 2-3 times per week. Each episode lasts only for a few moments.. There is no associated nausea, vomiting, or diaphoresis. For the last month she has also noted exertional dyspnea. She denies lower extremity edema or weight gain. She has noted orthopnea, though this is not unchanged from her baseline. She reports heavy snoring and her husband notes that she sometimes stops breathing overnight. She does not feel well rested in the mornings but denies daytime somnolence.  On 7/6 Samantha Turner had an episode of syncope that occurred after sitting on the couch. She denies any preceding chest pain, shortness of breath, lightheadedness, dizziness, or palpitations. Her husband reports that she sat on the couch and slumped to one side. She was out for approximately 1 minute and he does not know whether she was breathing at a time. Upon awakening she felt tired but was oriented. There is no loss of bowel or bladder function. Two days later she presented to the [facility] emergency department Due to progressive shortness of breath. In the ED she received Lasix but did not have any clinical improvement. She also had a CT angiogram the chest that was negative for for pulmonary embolism but did note mediastinal and hilar lymphadenopathy. There is also a mediastinal mass measuring 3.8 x 3.5 x 5.9 cm in the subcarinal mass. There was concern for sarcoidosis or lymphoma. She was transferred to [facility] for additional workup.  Samantha Turner had an echo that revealed LVEF 15-20% with diffuse  hypokinesis. There was akinesis of the inferior myocardium. She also had grade 2 diastolic dysfunction and a trivial pericardial effusion. She was also noted to be in atrial flutter with rate in the 130s to 140s. She was started on a diltiazem infusion without improvement in her rates. Cardiology was consult is for newly diagnosed heart failure and atrial flutter.   Past Medical History  Diagnosis Date  . Sinus tachycardia (HCC)   . Depression   . Anxiety   . Headache   . Atrial flutter (HCC) 12/16/2015    Social History   Social History  . Marital Status: Single    Spouse Name: N/A  . Number of Children: N/A  . Years of Education: N/A   Social History Main Topics  . Smoking status: Never Smoker   . Smokeless tobacco: Never Used  . Alcohol Use: Yes     Comment: rarel  . Drug Use: No  . Sexual Activity: Yes    Birth Control/ Protection: Surgical   Other Topics Concern  . None   Social History Narrative    ECHO:Study Conclusions--12/15/15  - Left ventricle: The cavity size was moderately dilated. Wall  thickness was normal. Systolic function was severely reduced. The  estimated ejection fraction was in the range of 15% to 20%.  Diffuse hypokinesis. Akinesis of the inferior myocardium. There  was fusion of early and atrial contributions to ventricular  filling. Features are consistent with a pseudonormal left  ventricular filling pattern, with concomitant abnormal relaxation  and increased filling pressure (grade 2 diastolic dysfunction). - Mitral valve: There was mild to moderate regurgitation. - Left atrium:  The atrium was mildly dilated. - Right atrium: The atrium was mildly dilated. - Pericardium, extracardiac: A trivial pericardial effusion was  identified.  ------------------------------------------------------------------- Study data: No prior study was available for comparison. Study status: Routine. Procedure: The patient reported no pain pre  or post test. Transthoracic echocardiography. Image quality was adequate. Study completion: There were no complications. Transthoracic echocardiography. M-mode, complete 2D, spectral Doppler, and color Doppler. Birthdate: Patient birthdate: Mar 25, 1961. Age: Patient is 55 yr old. Sex: Gender: female. BMI: 45.5 kg/m^2. Blood pressure: 123/83 Patient status: Inpatient. Study date: Study date: 12/15/2015. Study time: 09:56 AM. Location: Bedside.  Cardiac catheterization-12/16/15   There is severe left ventricular systolic dysfunction.  1. Normal coronary arteries. The LAD and left circumflex has 2 separate ostium. 2. Dilated left ventricle with severely reduced LV systolic function. mildly elevated left ventricular end-diastolic pressure.  Recommendations:  The patient has nonischemic cardiomyopathy likely tachycardia induced. I agree with rate control, anticoagulation and proceeding with TEE guided cardioversion. Oral anticoagulation with a NOAC can be started tomorrow morning.   BNP No results found for: BNP  ProBNP No results found for: PROBNP   Education Assessment and Provision:  Detailed education and instructions provided on heart failure disease management including the following:  Signs and symptoms of Heart Failure When to call the physician Importance of daily weights Low sodium diet Fluid restriction Medication management Anticipated future follow-up appointments  Patient education given on each of the above topics.  Patient acknowledges understanding and acceptance of all instructions.  I spoke with Samantha Turner regarding her new HF diagnosis.  She does have a scale at home and we discussed the importance of daily weights as well as when to contact the physician related to weight gains.  I reviewed a low sodium diet briefly and high sodium foods to avoid.  She denies any foreseen issues with getting or taking newly prescribed HF medications after  discharge.  She will likely follow with [facility] after discharge.    Education Materials:  "Living Better With Heart Failure" Booklet, Daily Weight Tracker Tool   High Risk Criteria for Readmission and/or Poor Patient Outcomes:  (Recommend Follow-up with [facility])--yes she would benefit from referral to AHF Clinic   EF <30%- Yes 15-20% with grade 2 dias dys  2 or more admissions in 6 months- No --new diagnosis  Difficult social situation- No denies  Demonstrates medication noncompliance- No foreseen issues--not taking any HF medications on arrival   Barriers of Care:  New HF--Knowledge and compliance  Discharge Planning:   Plans to return to [place] with husband.  She will need ongoing HF education and compliance reinforcement.

## 2015-12-17 NOTE — Progress Notes (Signed)
[facility] has been requested to perform a transesophageal echocardiogram on 12/18/15 for this patient.  After careful review of history and examination, the risks and benefits of transesophageal echocardiogram have been explained including risks of esophageal damage, perforation (1:10,000 risk), bleeding, pharyngeal hematoma as well as other potential complications associated with conscious sedation including aspiration, arrhythmia, respiratory failure and death. Alternatives to treatment were discussed, questions were answered. Patient is willing to proceed.  TEE - Dr. Delton See 12/18/15 @14 :00 . NPO after midnight. Meds with sips ok  Samantha Ishikawa, NP 12/17/2015 12:07 PM

## 2015-12-18 ENCOUNTER — Encounter (HOSPITAL_COMMUNITY): Payer: Self-pay | Admitting: Anesthesiology

## 2015-12-18 ENCOUNTER — Encounter (HOSPITAL_COMMUNITY)
Admission: AD | Disposition: A | Discharge status: Home / Self Care | Payer: Self-pay | Source: Other Acute Inpatient Hospital | Attending: Internal Medicine

## 2015-12-18 ENCOUNTER — Inpatient Hospital Stay (HOSPITAL_COMMUNITY): Payer: 59 | Admitting: Anesthesiology

## 2015-12-18 ENCOUNTER — Inpatient Hospital Stay (HOSPITAL_COMMUNITY): Payer: 59

## 2015-12-18 DIAGNOSIS — I4892 Unspecified atrial flutter: Secondary | ICD-10-CM

## 2015-12-18 DIAGNOSIS — I429 Cardiomyopathy, unspecified: Secondary | ICD-10-CM

## 2015-12-18 DIAGNOSIS — I428 Other cardiomyopathies: Secondary | ICD-10-CM

## 2015-12-18 DIAGNOSIS — Q330 Congenital cystic lung: Secondary | ICD-10-CM

## 2015-12-18 DIAGNOSIS — I340 Nonrheumatic mitral (valve) insufficiency: Secondary | ICD-10-CM

## 2015-12-18 HISTORY — PX: CARDIOVERSION: SHX1299

## 2015-12-18 HISTORY — PX: TEE WITHOUT CARDIOVERSION: SHX5443

## 2015-12-18 LAB — CBC
HEMATOCRIT: 37.4 % (ref 36.0–46.0)
Hemoglobin: 12.2 g/dL (ref 12.0–15.0)
MCH: 26 pg (ref 26.0–34.0)
MCHC: 32.6 g/dL (ref 30.0–36.0)
MCV: 79.6 fL (ref 78.0–100.0)
Platelets: 274 10*3/uL (ref 150–400)
RBC: 4.7 MIL/uL (ref 3.87–5.11)
RDW: 16.3 % — AB (ref 11.5–15.5)
WBC: 11.5 10*3/uL — AB (ref 4.0–10.5)

## 2015-12-18 LAB — DIFFERENTIAL
BASOS ABS: 0 10*3/uL (ref 0.0–0.1)
Basophils Relative: 0 %
EOS ABS: 0.3 10*3/uL (ref 0.0–0.7)
Eosinophils Relative: 2 %
LYMPHS ABS: 3.6 10*3/uL (ref 0.7–4.0)
Lymphocytes Relative: 25 %
Monocytes Absolute: 0.7 10*3/uL (ref 0.1–1.0)
Monocytes Relative: 5 %
Neutro Abs: 9.4 10*3/uL — ABNORMAL HIGH (ref 1.7–7.7)
Neutrophils Relative %: 68 %

## 2015-12-18 LAB — BASIC METABOLIC PANEL
ANION GAP: 10 (ref 5–15)
BUN: 13 mg/dL (ref 6–20)
CALCIUM: 9 mg/dL (ref 8.9–10.3)
CO2: 23 mmol/L (ref 22–32)
Chloride: 101 mmol/L (ref 101–111)
Creatinine, Ser: 0.88 mg/dL (ref 0.44–1.00)
GFR calc Af Amer: >60 mL/min (ref >60)
GFR calc non Af Amer: >60 mL/min (ref >60)
GLUCOSE: 94 mg/dL (ref 65–99)
POTASSIUM: 3.7 mmol/L (ref 3.5–5.1)
Sodium: 134 mmol/L — ABNORMAL LOW (ref 135–145)

## 2015-12-18 LAB — ANTINUCLEAR ANTIBODIES, IFA: ANA Ab, IFA: NEGATIVE

## 2015-12-18 LAB — HIV ANTIBODY (ROUTINE TESTING W REFLEX): HIV Screen 4th Generation wRfx: NONREACTIVE

## 2015-12-18 SURGERY — ECHOCARDIOGRAM, TRANSESOPHAGEAL
Anesthesia: Monitor Anesthesia Care

## 2015-12-18 MED ORDER — LIDOCAINE HCL (CARDIAC) 20 MG/ML IV SOLN
INTRAVENOUS | Status: DC | PRN
  Administered 2015-12-18: 80 mg via INTRATRACHEAL

## 2015-12-18 MED ORDER — PROPOFOL 500 MG/50ML IV EMUL
INTRAVENOUS | Status: DC | PRN
  Administered 2015-12-18: 100 ug/kg/min via INTRAVENOUS

## 2015-12-18 MED ORDER — PHENYLEPHRINE HCL 10 MG/ML IJ SOLN
INTRAMUSCULAR | Status: DC | PRN
Start: 2015-12-18 — End: 2015-12-18
  Administered 2015-12-18: 80 ug via INTRAVENOUS

## 2015-12-18 MED ORDER — METOPROLOL TARTRATE 100 MG PO TABS
100.0000 mg | ORAL_TABLET | Freq: Four times a day (QID) | ORAL | Status: DC
  Filled 2015-12-18: qty 1

## 2015-12-18 MED ORDER — AMIODARONE HCL 200 MG PO TABS
400.0000 mg | ORAL_TABLET | Freq: Two times a day (BID) | ORAL | Status: DC
  Administered 2015-12-18 – 2015-12-19 (×2): 400 mg via ORAL
  Filled 2015-12-18 (×2): qty 2

## 2015-12-18 MED ORDER — BUTAMBEN-TETRACAINE-BENZOCAINE 2-2-14 % EX AERO
INHALATION_SPRAY | CUTANEOUS | Status: DC | PRN
  Administered 2015-12-18: 2 via TOPICAL

## 2015-12-18 MED ORDER — METOPROLOL TARTRATE 100 MG PO TABS
100.0000 mg | ORAL_TABLET | Freq: Two times a day (BID) | ORAL | Status: DC
  Administered 2015-12-18 – 2015-12-19 (×2): 100 mg via ORAL
  Filled 2015-12-18 (×2): qty 1

## 2015-12-18 MED ORDER — METOPROLOL TARTRATE 25 MG PO TABS
25.0000 mg | ORAL_TABLET | Freq: Once | ORAL | Status: AC
  Administered 2015-12-18: 25 mg via ORAL
  Filled 2015-12-18: qty 1

## 2015-12-18 MED ORDER — PROPOFOL 10 MG/ML IV BOLUS
INTRAVENOUS | Status: DC | PRN
  Administered 2015-12-18: 15 mg via INTRAVENOUS
  Administered 2015-12-18: 10 mg via INTRAVENOUS

## 2015-12-18 MED ORDER — FUROSEMIDE 20 MG PO TABS
20.0000 mg | ORAL_TABLET | Freq: Every day | ORAL | Status: DC
Start: 2015-12-18 — End: 2015-12-19
  Administered 2015-12-18 – 2015-12-19 (×2): 20 mg via ORAL
  Filled 2015-12-18 (×2): qty 1

## 2015-12-18 NOTE — Progress Notes (Signed)
PROGRESS NOTE    Samantha Turner  VHQ:[id number] DOB: 1960-12-31 DOA: 12/14/2015 PCP: Kirstie Peri, MD   Brief Narrative:  Samantha Turner is a pleasant 55 year old female presented as a transfer from [facility] where she initially presented with complaints of chest pain, shortness of breath, palpitations, syncope. She had a CT of lungs done at that facility that revealed hilar adenopathy. She was transferred to this hospital for further workup. A transthoracic echocardiogram performed on 12/15/2015 showing severely reduced EF of 15-20%. Cardiology was consulted. Pulmonary medicine was also consulted regarding hilar adenopathy. Radiologists from outside facility included sarcoidosis and lymphoma in the differential diagnosis. Assessment & Plan   Acute combined systolic and diastolic congestive heart failure/Nonischemic Cardiomyopathy -Samantha. Turner presenting as a transfer from outside hospital, initially presented with complaints of chest pain and shortness of breath -Troponins cycled and remained negative. -Transthoracic echocardiogram however revealed severely reduced ejection fraction of 15-20% with grade 2 diastolic dysfunction -Cardiology consulted and appreciated,s/p cardiac catheterization: normal coronaries. Nonischemic cardiomyopathy likely tachycardia induced.   -continue lasix -Weight down 4 pounds since admission  Chest pain -EKG at outside hospital did not show acute ischemic changes, will repeat an EKG here -Continue aspirin 81 mg by mouth daily along with statin -Treatment and plan as above  Atrial flutter -was started on Cardizem drip and transitioned to PO cardizem  q6h -CHADSVasc score of at least 3 -Initially placed on heparin, transitioned to Eliquis -pending TEE/DCCV today  Hilar adenopathy -CT scan of lungs performed at outside hospital revealing hilar adenopathy. Their radiologist included sarcoid and lymphoma in the differential. Previous hospitalist discussed  case with Dr. Delton Coombes of pulmonary critical care medicine -Will need outpatient follow up  Depression -Continue lamictal and effexor  Diarrhea -Possibly due to liquid diet -Resolved -No recent antibiotics  DVT Prophylaxis  Eliquis  Code Status: Full  Family Communication: Husband at bedside  Disposition Plan: Admitted, pending TEE/DCCV today  Consultants Cardiology Pulmonology   Procedures  Echocardiogram Cardiac catheterization  Antibiotics   Anti-infectives    None      Subjective:   Samantha Turner seen and examined today.  No complaints today. Feels improved compared to previous days. Denies current chest pain, shortness of breath, abdominal pain, nausea, vomiting, dizziness, headache. Denies further diarrhea.  Objective:   Filed Vitals:   12/17/15 2214 12/18/15 0446 12/18/15 1058 12/18/15 1245  BP: 95/60 106/68  105/66  Pulse: 123 123 124 123  Temp: 97.9 F (36.6 C) 98.3 F (36.8 C)    TempSrc: Oral Oral    Resp: 18 18    Height:      Weight:  108.863 kg (240 lb)    SpO2: 99% 95%      Intake/Output Summary (Last 24 hours) at 12/18/15 1250 Last data filed at 12/18/15 1059  Gross per 24 hour  Intake      3 ml  Output      0 ml  Net      3 ml   Filed Weights   12/16/15 0445 12/17/15 0330 12/18/15 0446  Weight: 110.904 kg (244 lb 8 oz) 109.453 kg (241 lb 4.8 oz) 108.863 kg (240 lb)    Exam  General: Well developed, well nourished, NAD  HEENT: NCAT,  mucous membranes moist.   Neck: Supple, no JVD  Cardiovascular: S1 S2 auscultated, irregularly irregular, no murmur  Respiratory: Clear to auscultation bilaterally with equal chest rise  Abdomen: Soft, obese, nontender, nondistended, + bowel sounds  Extremities: warm dry without cyanosis clubbing or  edema  Neuro: AAOx3, nonfocal  Psych: Normal affect and demeanor, pleasant  Data Reviewed: I have personally reviewed following labs and imaging studies  CBC:  Recent Labs Lab  12/14/15 1618 12/15/15 0400 12/16/15 0322 12/17/15 0445 12/18/15 0328  WBC 16.1* 16.1* 16.2* 13.2* 11.5*  HGB 12.2 12.3 12.7 11.9* 12.2  HCT 38.8 40.3 40.9 37.9 37.4  MCV 81.2 81.6 81.2 80.0 79.6  PLT 355 337 380 336 274   Basic Metabolic Panel:  Recent Labs Lab 12/14/15 1618 12/15/15 0400 12/16/15 0322 12/17/15 0445 12/18/15 0328  NA 133* 139 137 135 134*  K 4.5 4.7 4.4 3.8 3.7  CL 99* 101 101 104 101  CO2 25 28 28 22 23   GLUCOSE 152* 122* 118* 106* 94  BUN 19 19 22* 18 13  CREATININE 0.92 0.90 1.03* 0.86 0.88  CALCIUM 9.7 9.8 9.3 8.9 9.0  MG 2.3  --   --   --   --    GFR: Estimated Creatinine Clearance: 84.5 mL/min (by C-G formula based on Cr of 0.88). Liver Function Tests:  Recent Labs Lab 12/14/15 1618 12/16/15 0322  AST 14* 14*  ALT 14 13*  ALKPHOS 91 87  BILITOT 0.6 0.7  PROT 8.0 7.4  ALBUMIN 3.9 3.8   No results for input(s): LIPASE, AMYLASE in the last 168 hours. No results for input(s): AMMONIA in the last 168 hours. Coagulation Profile:  Recent Labs Lab 12/16/15 1210  INR 1.05   Cardiac Enzymes:  Recent Labs Lab 12/14/15 1618 12/14/15 2123 12/15/15 0400  TROPONINI 0.03* <0.03 <0.03   BNP (last 3 results) No results for input(s): PROBNP in the last 8760 hours. HbA1C: No results for input(s): HGBA1C in the last 72 hours. CBG: No results for input(s): GLUCAP in the last 168 hours. Lipid Profile:  Recent Labs  12/16/15 0322  CHOL 214*  HDL 25*  LDLCALC 109*  TRIG 399*  CHOLHDL 8.6   Thyroid Function Tests: No results for input(s): TSH, T4TOTAL, FREET4, T3FREE, THYROIDAB in the last 72 hours. Anemia Panel: No results for input(s): VITAMINB12, FOLATE, FERRITIN, TIBC, IRON, RETICCTPCT in the last 72 hours. Urine analysis: No results found for: COLORURINE, APPEARANCEUR, LABSPEC, PHURINE, GLUCOSEU, HGBUR, BILIRUBINUR, KETONESUR, PROTEINUR, UROBILINOGEN, NITRITE, LEUKOCYTESUR Sepsis  Labs: @LABRCNTIP (procalcitonin:4,lacticidven:4)  )No results found for this or any previous visit (from the past 240 hour(s)).    Radiology Studies: No results found.   Scheduled Meds: . [MAR Hold] apixaban  5 mg Oral BID  . [MAR Hold] atorvastatin  40 mg Oral q1800  . [MAR Hold] diltiazem  90 mg Oral Q12H  . [MAR Hold] furosemide  20 mg Oral Daily  . [MAR Hold] lamoTRIgine  150 mg Oral Daily  . [MAR Hold] metoprolol tartrate  100 mg Oral Q6H  . [MAR Hold] sodium chloride flush  3 mL Intravenous Q12H  . [MAR Hold] venlafaxine  75 mg Oral BID   Continuous Infusions: . sodium chloride 20 mL/hr at 12/17/15 2300     LOS: 4 days   Time Spent in minutes   30 minutes  Samantha Turner D.O. on 12/18/2015 at 12:50 PM  Between 7am to 7pm - Pager - [phone]  After 7pm go to [email or web address] - password [email or web address]  And look for the night coverage person covering for me after hours  [facility] Office  [phone]

## 2015-12-18 NOTE — Progress Notes (Signed)
Patient lying in bed, no needs at this time call light within reach 

## 2015-12-18 NOTE — Progress Notes (Signed)
Patient cardioverted successfully.  LVEF 15-20%.  Will load with amiodarone 400mg  bid x7 days followed by 200 mg daily to maintain sinus rhythm.  Will stop diltiazem now as her BP is low.  Likely home tomorrow.  Philicia Heyne C. Duke Salvia, MD, [facility]  12/18/2015  4:52 PM

## 2015-12-18 NOTE — Progress Notes (Signed)
Patient refuses CPAP. RT will continue to monitor as needed. 

## 2015-12-18 NOTE — H&P (View-Only) (Signed)
PROGRESS NOTE    Samantha Turner  MRN:[id number] DOB: 04/02/1961 DOA: 12/14/2015 PCP: SHAH,ASHISH, MD   Brief Narrative:  Samantha Turner is a pleasant 55-year-old female presented as a transfer from [facility] where she initially presented with complaints of chest pain, shortness of breath, palpitations, syncope. She had a CT of lungs done at that facility that revealed hilar adenopathy. She was transferred to this hospital for further workup. A transthoracic echocardiogram performed on 12/15/2015 showing severely reduced EF of 15-20%. Cardiology was consulted. Pulmonary medicine was also consulted regarding hilar adenopathy. Radiologists from outside facility included sarcoidosis and lymphoma in the differential diagnosis. Assessment & Plan   Acute combined systolic and diastolic congestive heart failure/Nonischemic Cardiomyopathy -Samantha. Zuelke presenting as a transfer from outside hospital, initially presented with complaints of chest pain and shortness of breath -Troponins cycled and remained negative. -Transthoracic echocardiogram however revealed severely reduced ejection fraction of 15-20% with grade 2 diastolic dysfunction -Cardiology consulted and appreciated,s/p cardiac catheterization: normal coronaries. Nonischemic cardiomyopathy likely tachycardia induced.   -continue lasix -Weight down 4 pounds since admission  Chest pain -EKG at outside hospital did not show acute ischemic changes, will repeat an EKG here -Continue aspirin 81 mg by mouth daily along with statin -Treatment and plan as above  Atrial flutter -was started on Cardizem drip and transitioned to PO cardizem 90mg q6h -CHADSVasc score of at least 3 -Initially placed on heparin, transitioned to Eliquis -pending TEE/DCCV today  Hilar adenopathy -CT scan of lungs performed at outside hospital revealing hilar adenopathy. Their radiologist included sarcoid and lymphoma in the differential. Previous hospitalist discussed  case with Dr. Byrum of pulmonary critical care medicine -Will need outpatient follow up  Depression -Continue lamictal and effexor  Diarrhea -Possibly due to liquid diet -Resolved -No recent antibiotics  DVT Prophylaxis  Eliquis  Code Status: Full  Family Communication: Husband at bedside  Disposition Plan: Admitted, pending TEE/DCCV today  Consultants Cardiology Pulmonology   Procedures  Echocardiogram Cardiac catheterization  Antibiotics   Anti-infectives    None      Subjective:   Samantha Turner seen and examined today.  No complaints today. Feels improved compared to previous days. Denies current chest pain, shortness of breath, abdominal pain, nausea, vomiting, dizziness, headache. Denies further diarrhea.  Objective:   Filed Vitals:   12/17/15 2214 12/18/15 0446 12/18/15 1058 12/18/15 1245  BP: 95/60 106/68  105/66  Pulse: 123 123 124 123  Temp: 97.9 F (36.6 C) 98.3 F (36.8 C)    TempSrc: Oral Oral    Resp: 18 18    Height:      Weight:  108.863 kg (240 lb)    SpO2: 99% 95%      Intake/Output Summary (Last 24 hours) at 12/18/15 1250 Last data filed at 12/18/15 1059  Gross per 24 hour  Intake      3 ml  Output      0 ml  Net      3 ml   Filed Weights   12/16/15 0445 12/17/15 0330 12/18/15 0446  Weight: 110.904 kg (244 lb 8 oz) 109.453 kg (241 lb 4.8 oz) 108.863 kg (240 lb)    Exam  General: Well developed, well nourished, NAD  HEENT: NCAT,  mucous membranes moist.   Neck: Supple, no JVD  Cardiovascular: S1 S2 auscultated, irregularly irregular, no murmur  Respiratory: Clear to auscultation bilaterally with equal chest rise  Abdomen: Soft, obese, nontender, nondistended, + bowel sounds  Extremities: warm dry without cyanosis clubbing or   edema  Neuro: AAOx3, nonfocal  Psych: Normal affect and demeanor, pleasant  Data Reviewed: I have personally reviewed following labs and imaging studies  CBC:  Recent Labs Lab  12/14/15 1618 12/15/15 0400 12/16/15 0322 12/17/15 0445 12/18/15 0328  WBC 16.1* 16.1* 16.2* 13.2* 11.5*  HGB 12.2 12.3 12.7 11.9* 12.2  HCT 38.8 40.3 40.9 37.9 37.4  MCV 81.2 81.6 81.2 80.0 79.6  PLT 355 337 380 336 274   Basic Metabolic Panel:  Recent Labs Lab 12/14/15 1618 12/15/15 0400 12/16/15 0322 12/17/15 0445 12/18/15 0328  NA 133* 139 137 135 134*  K 4.5 4.7 4.4 3.8 3.7  CL 99* 101 101 104 101  CO2 25 28 28 22 23   GLUCOSE 152* 122* 118* 106* 94  BUN 19 19 22* 18 13  CREATININE 0.92 0.90 1.03* 0.86 0.88  CALCIUM 9.7 9.8 9.3 8.9 9.0  MG 2.3  --   --   --   --    GFR: Estimated Creatinine Clearance: 84.5 mL/min (by C-G formula based on Cr of 0.88). Liver Function Tests:  Recent Labs Lab 12/14/15 1618 12/16/15 0322  AST 14* 14*  ALT 14 13*  ALKPHOS 91 87  BILITOT 0.6 0.7  PROT 8.0 7.4  ALBUMIN 3.9 3.8   No results for input(s): LIPASE, AMYLASE in the last 168 hours. No results for input(s): AMMONIA in the last 168 hours. Coagulation Profile:  Recent Labs Lab 12/16/15 1210  INR 1.05   Cardiac Enzymes:  Recent Labs Lab 12/14/15 1618 12/14/15 2123 12/15/15 0400  TROPONINI 0.03* <0.03 <0.03   BNP (last 3 results) No results for input(s): PROBNP in the last 8760 hours. HbA1C: No results for input(s): HGBA1C in the last 72 hours. CBG: No results for input(s): GLUCAP in the last 168 hours. Lipid Profile:  Recent Labs  12/16/15 0322  CHOL 214*  HDL 25*  LDLCALC 109*  TRIG 399*  CHOLHDL 8.6   Thyroid Function Tests: No results for input(s): TSH, T4TOTAL, FREET4, T3FREE, THYROIDAB in the last 72 hours. Anemia Panel: No results for input(s): VITAMINB12, FOLATE, FERRITIN, TIBC, IRON, RETICCTPCT in the last 72 hours. Urine analysis: No results found for: COLORURINE, APPEARANCEUR, LABSPEC, PHURINE, GLUCOSEU, HGBUR, BILIRUBINUR, KETONESUR, PROTEINUR, UROBILINOGEN, NITRITE, LEUKOCYTESUR Sepsis  Labs: @LABRCNTIP (procalcitonin:4,lacticidven:4)  )No results found for this or any previous visit (from the past 240 hour(s)).    Radiology Studies: No results found.   Scheduled Meds: . [MAR Hold] apixaban  5 mg Oral BID  . [MAR Hold] atorvastatin  40 mg Oral q1800  . [MAR Hold] diltiazem  90 mg Oral Q12H  . [MAR Hold] furosemide  20 mg Oral Daily  . [MAR Hold] lamoTRIgine  150 mg Oral Daily  . [MAR Hold] metoprolol tartrate  100 mg Oral Q6H  . [MAR Hold] sodium chloride flush  3 mL Intravenous Q12H  . [MAR Hold] venlafaxine  75 mg Oral BID   Continuous Infusions: . sodium chloride 20 mL/hr at 12/17/15 2300     LOS: 4 days   Time Spent in minutes   30 minutes  Samantha Turner D.O. on 12/18/2015 at 12:50 PM  Between 7am to 7pm - Pager - [phone]  After 7pm go to [email or web address] - password [email or web address]  And look for the night coverage person covering for me after hours  [facility] Office  [phone]

## 2015-12-18 NOTE — Anesthesia Preprocedure Evaluation (Addendum)
Anesthesia Evaluation  Patient identified by MRN, date of birth, ID band Patient awake    Reviewed: Allergy & Precautions, NPO status , Patient's Chart, lab work & pertinent test results  Airway Mallampati: II  TM Distance: >3 FB Neck ROM: Full    Dental  (+) Loose,    Pulmonary    breath sounds clear to auscultation       Cardiovascular  Rhythm:Irregular Rate:Tachycardia     Neuro/Psych    GI/Hepatic   Endo/Other    Renal/GU      Musculoskeletal   Abdominal (+) + obese,   Peds  Hematology   Anesthesia Other Findings   Reproductive/Obstetrics                            Anesthesia Physical Anesthesia Plan  ASA: III  Anesthesia Plan: MAC   Post-op Pain Management:    Induction: Intravenous  Airway Management Planned: Natural Airway and Nasal Cannula  Additional Equipment:   Intra-op Plan:   Post-operative Plan:   Informed Consent: I have reviewed the patients History and Physical, chart, labs and discussed the procedure including the risks, benefits and alternatives for the proposed anesthesia with the patient or authorized representative who has indicated his/her understanding and acceptance.   Dental advisory given  Plan Discussed with: CRNA and Anesthesiologist  Anesthesia Plan Comments:        Anesthesia Quick Evaluation

## 2015-12-18 NOTE — Progress Notes (Signed)
Hospital Problem List     Principal Problem:   Acute combined systolic and diastolic congestive heart failure (HCC) Active Problems:   Chest pain   Depression   Hilar adenopathy   Sinus tachycardia (HCC)   Atrial flutter (HCC)   Bronchogenic cyst    Patient Profile:   Primary Cardiologist: Dr. Duke Salvia --> Lives in [place], [place] and wishes to follow-up there.  74F with obesity and no other significant PMH who presented as a transfer from [facility] on 12/14/2015 after an episode of syncope and a subsequent diagnosis of new-onset systolic and diastolic dysfunction (EF 15-20% by echo).   Subjective   Denies any chest discomfort or palpitations. HR in the 110's - 120's overnight and this AM.   Inpatient Medications    . apixaban  5 mg Oral BID  . atorvastatin  40 mg Oral q1800  . diltiazem  90 mg Oral Q12H  . lamoTRIgine  150 mg Oral Daily  . metoprolol tartrate  50 mg Oral Q6H  . sodium chloride flush  3 mL Intravenous Q12H  . venlafaxine  75 mg Oral BID    Vital Signs    Filed Vitals:   12/17/15 1300 12/17/15 1714 12/17/15 2214 12/18/15 0446  BP: 117/73 108/80 95/60 106/68  Pulse: 126 129 123 123  Temp: 97.8 F (36.6 C) 98.1 F (36.7 C) 97.9 F (36.6 C) 98.3 F (36.8 C)  TempSrc: Oral Oral Oral Oral  Resp: Height:      Weight:    240 lb (108.863 kg)  SpO2: 99% 99% 99% 95%    Intake/Output Summary (Last 24 hours) at 12/18/15 0817 Last data filed at 12/17/15 1229  Gross per 24 hour  Intake    420 ml  Output      0 ml  Net    420 ml   Filed Weights   12/16/15 0445 12/17/15 0330 12/18/15 0446  Weight: 244 lb 8 oz (110.904 kg) 241 lb 4.8 oz (109.453 kg) 240 lb (108.863 kg)    Physical Exam    General: Well developed, well nourished, female appearing in no acute distress. Head: Normocephalic, atraumatic.  Neck: Supple without bruits, JVD not elevated. Lungs:  Resp regular and unlabored, CTA without wheezing or rales. Heart:  Irregularly irregular, tachycardiac, S1, S2, no S3, S4, or murmur; no rub. Abdomen: Soft, non-tender, non-distended with normoactive bowel sounds. No hepatomegaly. No rebound/guarding. No obvious abdominal masses. Extremities: No clubbing, cyanosis, or edema. Distal pedal pulses are 2+ bilaterally. Neuro: Alert and oriented X 3. Moves all extremities spontaneously. Psych: Normal affect.  Labs    CBC  Recent Labs  12/17/15 0445 12/18/15 0328  WBC 13.2* 11.5*  HGB 11.9* 12.2  HCT 37.9 37.4  MCV 80.0 79.6  PLT 336 274   Basic Metabolic Panel  Recent Labs  12/17/15 0445 12/18/15 0328  NA 135 134*  K 3.8 3.7  CL 104 101  CO2 22 23  GLUCOSE 106* 94  BUN 18 13  CREATININE 0.86 0.88  CALCIUM 8.9 9.0   Liver Function Tests  Recent Labs  12/16/15 0322  AST 14*  ALT 13*  ALKPHOS 87  BILITOT 0.7  PROT 7.4  ALBUMIN 3.8   Fasting Lipid Panel  Recent Labs  12/16/15 0322  CHOL 214*  HDL 25*  LDLCALC 109*  TRIG 399*  CHOLHDL 8.6    Telemetry    Atrial flutter, HR in 110's - 120's.    Cardiac  Studies and Radiology    Dg Chest 2 View: 12/15/2015  CLINICAL DATA:  Left chest pain and arm pain which radiates to patient's back. EXAM: CHEST  2 VIEW COMPARISON:  None. FINDINGS: The cardiac silhouette is normal. There is mild widening of the mediastinal contour with prominence of the contour of the ascending aorta. There is no evidence of focal airspace consolidation, pleural effusion or pneumothorax. Osseous structures are without acute abnormality. Soft tissues are grossly normal. IMPRESSION: Mild widening of the mediastinal contour with prominence of the contour of the ascending aorta. This may represent overlapping vascular shadows, however aneurysmal dilation of the ascending aorta cannot be excluded with this appearance. CT angiogram of the chest may be considered for further evaluation. These results will be called to the ordering clinician or representative by the  Radiologist Assistant, and communication documented in the PACS or zVision Dashboard. Electronically Signed   By: Ted Mcalpine M.D.   On: 12/15/2015 09:17    Cardiac Catheterization: 12/16/2015   There is severe left ventricular systolic dysfunction.  1. Normal coronary arteries. The LAD and left circumflex has 2 separate ostium. 2. Dilated left ventricle with severely reduced LV systolic function. mildly elevated left ventricular end-diastolic pressure.  Recommendations:  The patient has nonischemic cardiomyopathy likely tachycardia induced. I agree with rate control, anticoagulation and proceeding with TEE guided cardioversion. Oral anticoagulation with a NOAC can be started tomorrow morning.   Assessment & Plan    1. New Onset Atrial flutter - Was on diltiazem infusion (at 15 mg/hr) at time of admission. Switched to PO Lopressor and Diltiazem. Would ideally like to not use Diltiazem long-term with her significantly reduced EF. Currently on Lopressor 50mg  Q6H and Cardizem 90mg  Q12H. Will give additional PO Lopressor 25mg  right now to help with HR in the 120's. Would anticipate d/c'ing Cardizem following TEE/DCCV if successful.  - This patients CHA2DS2-VASc Score and unadjusted Ischemic Stroke Rate (% per year) is equal to 2.2 % stroke rate/year from a score of 2 (CHF, Female). Started on Heparin for anticoagulation at time of admission, switched to Eliquis in AM of 7/13. Would have received 3 doses by time of TEE/DCCV today. Scheduled at 1400 with Dr. Delton See. The risks and benefits have been discussed and she agrees to proceed.  2. Acute systolic and diastolic heart failure/ Nonischemic Cardiomyopathy - Newly diagnosed this admission by echo with EF of 15-20% with diffuse hypokinesis and akinesis of the inferior myocardium.  Given the finding of lymphadenopathy and focal wall motion abnormalities, this is concerning for a presentation of sarcoidosis. Sed rate 29, HIV antibody  negative. Protein electrophoresis and ANA pending.   - cardiac cath on 7/12 showed normal cors and severely reduced LV function, consistent with nonischemic cardiomyopathy.  - continue statin and BB. Recommend addition of low-dose ACE-I or ARB prior to discharge if BP will allow (hopefully have more BP room once Cardizem stopped). Recommended to start PO Lasix 20mg  daily by Dr. Duke Salvia yesterday. Will order.  Signed, Ellsworth Lennox , PA-C 8:17 AM 12/18/2015 Pager: [phone]

## 2015-12-18 NOTE — CV Procedure (Signed)
     Transesophageal Echocardiogram Note  Samantha Turner [id number] 1960/11/07  Procedure: Transesophageal Echocardiogram Indications: atrial flutter with RVR  Procedure Details Consent: Obtained Time Out: Verified patient identification, verified procedure, site/side was marked, verified correct patient position, special equipment/implants available, Radiology Safety Procedures followed,  medications/allergies/relevent history reviewed, required imaging and test results available.  Performed  Medications: Iv Propofol administered by anesthesia staff  Left Ventrical:  LVEF 15-20%  Mitral Valve: Moderate MR  Aortic Valve: No AI.  Tricuspid Valve: Mild TR.  Pulmonic Valve: No PR.  Left Atrium/ Left atrial appendage: Moderately dilated, no thrombus, no thrombus in the appendage.  Atrial septum: Lipomatous hypertrophy of the interatrial septum, no ASD or PFO.   Aorta: Mild plaque.   Complications: No apparent complications Patient did tolerate procedure well.  [name], MD, [facility] 12/18/2015, 3:00 PM   Cardioversion Note  Samantha Turner [id number] [birth year]  Procedure: DC Cardioversion Indications: atrial flutter with RVR. Procedure Details Consent: Obtained Time Out: Verified patient identification, verified procedure, site/side was marked, verified correct patient position, special equipment/implants available, Radiology Safety Procedures followed,  medications/allergies/relevent history reviewed, required imaging and test results available.  Performed  The patient has been on adequate anticoagulation.  The patient received IV propofol for sedation.  Synchronous cardioversion was performed at 120 joules x 1.  The cardioversion was successful.   Complications: No apparent complications Patient did tolerate procedure well.   [name], MD, [facility] 12/18/2015, 3:00 PM

## 2015-12-18 NOTE — Interval H&P Note (Signed)
History and Physical Interval Note:  12/18/2015 2:20 PM  Samantha Turner  has presented today for surgery, with the diagnosis of AFIB  The various methods of treatment have been discussed with the patient and family. After consideration of risks, benefits and other options for treatment, the patient has consented to  Procedure(s): TRANSESOPHAGEAL ECHOCARDIOGRAM (TEE) (N/A) CARDIOVERSION (N/A) as a surgical intervention .  The patient's history has been reviewed, patient examined, no change in status, stable for surgery.  I have reviewed the patient's chart and labs.  Questions were answered to the patient's satisfaction.     [name]

## 2015-12-18 NOTE — Anesthesia Postprocedure Evaluation (Signed)
Anesthesia Post Note  Patient: Samantha Turner  Procedure(s) Performed: Procedure(s) (LRB): TRANSESOPHAGEAL ECHOCARDIOGRAM (TEE) (N/A) CARDIOVERSION (N/A)  Patient location during evaluation: Endoscopy Anesthesia Type: MAC Level of consciousness: awake, awake and alert and oriented Pain management: pain level controlled Vital Signs Assessment: post-procedure vital signs reviewed and stable Respiratory status: spontaneous breathing, nonlabored ventilation and respiratory function stable Cardiovascular status: blood pressure returned to baseline Anesthetic complications: no    Last Vitals:  Filed Vitals:   12/18/15 1455 12/18/15 1500  BP: 104/54 100/58  Pulse: 65 65  Temp:    Resp: 20 22    Last Pain:  Filed Vitals:   12/18/15 1500  PainSc: 0-No pain                 Jonique Kulig COKER

## 2015-12-18 NOTE — Progress Notes (Signed)
Echocardiogram Echocardiogram Transesophageal has been performed.  Dorothey Baseman 12/18/2015, 2:51 PM

## 2015-12-18 NOTE — Transfer of Care (Signed)
Immediate Anesthesia Transfer of Care Note  Patient: Samantha Turner  Procedure(s) Performed: Procedure(s): TRANSESOPHAGEAL ECHOCARDIOGRAM (TEE) (N/A) CARDIOVERSION (N/A)  Patient Location: Endoscopy Unit  Anesthesia Type:General  Level of Consciousness: awake, alert , oriented and patient cooperative  Airway & Oxygen Therapy: Patient Spontanous Breathing and Patient connected to nasal cannula oxygen  Post-op Assessment: Report given to RN and Post -op Vital signs reviewed and stable  Post vital signs: Reviewed and stable  Last Vitals:  Filed Vitals:   12/18/15 1254 12/18/15 1447  BP: 115/75 99/54  Pulse: 122 66  Temp:    Resp: 14 25    Last Pain:  Filed Vitals:   12/18/15 1448  PainSc: 0-No pain         Complications: No apparent anesthesia complications

## 2015-12-19 LAB — CBC
HEMATOCRIT: 38.1 % (ref 36.0–46.0)
HEMOGLOBIN: 11.7 g/dL — AB (ref 12.0–15.0)
MCH: 24.7 pg — AB (ref 26.0–34.0)
MCHC: 30.7 g/dL (ref 30.0–36.0)
MCV: 80.5 fL (ref 78.0–100.0)
Platelets: 309 10*3/uL (ref 150–400)
RBC: 4.73 MIL/uL (ref 3.87–5.11)
RDW: 16.5 % — ABNORMAL HIGH (ref 11.5–15.5)
WBC: 13.7 10*3/uL — ABNORMAL HIGH (ref 4.0–10.5)

## 2015-12-19 LAB — BASIC METABOLIC PANEL
Anion gap: 8 (ref 5–15)
BUN: 14 mg/dL (ref 6–20)
CHLORIDE: 106 mmol/L (ref 101–111)
CO2: 23 mmol/L (ref 22–32)
CREATININE: 0.96 mg/dL (ref 0.44–1.00)
Calcium: 9.2 mg/dL (ref 8.9–10.3)
GFR calc Af Amer: >60 mL/min (ref >60)
GFR calc non Af Amer: >60 mL/min (ref >60)
Glucose, Bld: 110 mg/dL — ABNORMAL HIGH (ref 65–99)
Potassium: 4.2 mmol/L (ref 3.5–5.1)
SODIUM: 137 mmol/L (ref 135–145)

## 2015-12-19 MED ORDER — LISINOPRIL 5 MG PO TABS: 5.0000 mg | ORAL_TABLET | Freq: Every day | ORAL | Status: DC

## 2015-12-19 MED ORDER — ATORVASTATIN CALCIUM 40 MG PO TABS
40.0000 mg | ORAL_TABLET | Freq: Every day | ORAL | Status: DC
Start: 2015-12-19 — End: 2015-12-30

## 2015-12-19 MED ORDER — AMIODARONE HCL 200 MG PO TABS: 200.0000 mg | ORAL_TABLET | Freq: Every day | ORAL | Status: DC

## 2015-12-19 MED ORDER — METOPROLOL SUCCINATE ER 50 MG PO TB24: 50.0000 mg | ORAL_TABLET | Freq: Two times a day (BID) | ORAL | Status: DC

## 2015-12-19 MED ORDER — APIXABAN 5 MG PO TABS: 5.0000 mg | ORAL_TABLET | Freq: Two times a day (BID) | ORAL | Status: DC

## 2015-12-19 MED ORDER — AMIODARONE HCL 400 MG PO TABS: 400.0000 mg | ORAL_TABLET | Freq: Two times a day (BID) | ORAL | Status: DC

## 2015-12-19 MED ORDER — FUROSEMIDE 20 MG PO TABS: 20.0000 mg | ORAL_TABLET | Freq: Every day | ORAL | Status: DC

## 2015-12-19 NOTE — Discharge Summary (Signed)
Physician Discharge Summary  Samantha Turner RSW:[id number] DOB: [birth year] DOA: 12/14/2015  PCP: Samantha Peri, MD  Admit date: 12/14/2015 Discharge date: 12/19/2015  Time spent: 45 minutes  Recommendations for Outpatient Follow-up:  Patient will be discharged to home.  Patient will need to follow up with primary care provider within one week of discharge.  Follow up with Samantha Turner, cardiology. Patient should continue medications as prescribed.  Patient should follow a heart healthy diet.   Discharge Diagnoses:  Acute combined systolic and diastolic congestive heart failure/Nonischemic Cardiomyopathy Chest pain Atrial flutter Hilar adenopathy Depression Diarrhea  Discharge Condition: Stable  Diet recommendation: heart healthy  Filed Weights   12/17/15 0330 12/18/15 0446 12/19/15 0330  Weight: 109.453 kg (241 lb 4.8 oz) 108.863 kg (240 lb) 108.6 kg (239 lb 6.7 oz)    History of present illness:  On 12/14/2015 by Samantha Turner Pt is 55 yo female with HTN, depression, who [facility] for evaluation of chest pain and dyspnea, an episode of syncope. Pt explains this has started about one week prior to this admission, intermittent and mostly left sided, sharp and 7/10 in severity when present, occasionally radiating to the neck and back area between shoulder blades, down her left arm, no specific alleviating or aggravating factors, associated occasionally with dyspnea, numbness and tingling in left hand fingertips. Pt explains she was reluctant to even go see a doctor but she passed at on Saturday 7/8 and that is why she went to the hospital. Pt reports no similar events in the past, no fevers, chills, no abd or urinary concerns. She currently denies chest pain or dyspnea. She denies headaches, no visual changes, no numbness or tingling in LE's. Pt reports feeling tired but denies known weight loss or weight gain.   Hospital Course:  Acute combined  systolic and diastolic congestive heart failure/Nonischemic Cardiomyopathy -Samantha Turner presenting as a transfer from outside hospital, initially presented with complaints of chest pain and shortness of breath -Troponins cycled and remained negative. -Transthoracic echocardiogram however revealed severely reduced ejection fraction of 15-20% with grade 2 diastolic dysfunction -Cardiology consulted and appreciated,s/p cardiac catheterization: normal coronaries. Nonischemic cardiomyopathy likely tachycardia induced.  -continue lasix -Weight down 6 pounds since admission -Started on lisinopril, continue metoprolol and lasix, statin  Chest pain -EKG at outside hospital did not show acute ischemic changes, will repeat an EKG here -Continue aspirin 81 mg by mouth daily along with statin -Treatment and plan as above  Atrial flutter -was started on Cardizem drip and transitioned to PO cardizem 90mg  q6h-discontinued due to low BP -CHADSVasc score of at least 3 -Initially placed on heparin, transitioned to Eliquis -TEE/DCCV 7/14, currently in sinus rhythm -TEE showed no ASD/PFO -Started on amiodarone 400mg  BID for one week, reduce to 200mg  daily thereafter (per Samantha Turner)  Hilar adenopathy -CT scan of lungs performed at outside hospital revealing hilar adenopathy. Their radiologist included sarcoid and lymphoma in the differential. Previous hospitalist discussed case with Dr. Delton Coombes of pulmonary critical care medicine -Will need outpatient follow up  Depression -Continue lamictal and effexor  Diarrhea -Possibly due to liquid diet -Resolved -No recent antibiotics  Consultants Cardiology Pulmonology   Procedures  Echocardiogram Cardiac catheterization TEE/DCCV  Discharge Exam: Filed Vitals:   12/18/15 2149 12/19/15 0330  BP: 123/73 114/75  Pulse: 92 88  Temp:  98.2 F (36.8 C)  Resp:  16     General: Well developed, well nourished, NAD, appears stated age  HEENT: NCAT,  PERRLA, EOMI,  Anicteic Sclera, mucous membranes moist.  Neck: Supple, no JVD, no masses  Cardiovascular: S1 S2 auscultated, no rubs, murmurs or gallops. Regular rate and rhythm.  Respiratory: Clear to auscultation bilaterally with equal chest rise  Abdomen: Soft, nontender, nondistended, + bowel sounds  Extremities: warm dry without cyanosis clubbing or edema  Neuro: AAOx3, cranial nerves grossly intact. Strength 5/5 in patient's upper and lower extremities bilaterally  Skin: Without rashes exudates or nodules  Psych: Normal affect and demeanor with intact judgement and insight  Discharge Instructions      Discharge Instructions    Discharge instructions    Complete by:  As directed   Patient will be discharged to home.  Patient will need to follow up with primary care provider within one week of discharge. Follow up with cardiology. Patient should continue medications as prescribed.  Patient should follow a heart healthy diet with fluid restriction per day.            Medication List    STOP taking these medications        meloxicam 7.5 MG tablet  Commonly known as:  MOBIC      TAKE these medications        ALEVE 220 MG tablet  Generic drug:  naproxen sodium  Take 440 mg by mouth daily as needed (pain).     amiodarone 400 MG tablet  Commonly known as:  PACERONE  Take 1 tablet (400 mg total) by mouth 2 (two) times daily.     amiodarone 200 MG tablet  Commonly known as:  PACERONE  Take 1 tablet (200 mg total) by mouth daily.  Start taking on:  12/25/2015     apixaban 5 MG Tabs tablet  Commonly known as:  ELIQUIS  Take 1 tablet (5 mg total) by mouth 2 (two) times daily.     atorvastatin 40 MG tablet  Commonly known as:  LIPITOR  Take 1 tablet (40 mg total) by mouth daily at 6 PM.     furosemide 20 MG tablet  Commonly known as:  LASIX  Take 1 tablet (20 mg total) by mouth daily.     HYDROcodone-acetaminophen 5-325 MG tablet  Commonly known as:   NORCO  Take 1 tablet by mouth every 4 (four) hours as needed for moderate pain.     lamoTRIgine 100 MG tablet  Commonly known as:  LAMICTAL  Take 150 mg by mouth daily.     lisinopril 5 MG tablet  Commonly known as:  PRINIVIL,ZESTRIL  Take 1 tablet (5 mg total) by mouth daily.     metoprolol succinate 50 MG 24 hr tablet  Commonly known as:  TOPROL-XL  Take 1 tablet (50 mg total) by mouth 2 (two) times daily with a meal. Take with or immediately following a meal.     venlafaxine 75 MG tablet  Commonly known as:  EFFEXOR  Take 75 mg by mouth 2 (two) times daily.       No Known Allergies Follow-up Information    Follow up with [facility] Thompson Peak, MD. Schedule an appointment as soon as possible for a visit in 1 week.   Specialty:  Internal Medicine   Why:  Hospital follow up   Contact information:   [address] [place] [address] [place] [address] [phone]       Follow up with Chilton Si, MD. Schedule an appointment as soon as possible for a visit in 1 week.   Specialty:  Cardiology   Why:  Hospital follow up  Contact information:   [address] [place] [address] [place] [address] [phone]        The results of significant diagnostics from this hospitalization (including imaging, microbiology, ancillary and laboratory) are listed below for reference.    Significant Diagnostic Studies: Dg Chest 2 View  12/15/2015  CLINICAL DATA:  Left chest pain and arm pain which radiates to patient's back. EXAM: CHEST  2 VIEW COMPARISON:  None. FINDINGS: The cardiac silhouette is normal. There is mild widening of the mediastinal contour with prominence of the contour of the ascending aorta. There is no evidence of focal airspace consolidation, pleural effusion or pneumothorax. Osseous structures are without acute abnormality. Soft tissues are grossly normal. IMPRESSION: Mild widening of the mediastinal contour with prominence of the contour of the ascending aorta. This may represent overlapping  vascular shadows, however aneurysmal dilation of the ascending aorta cannot be excluded with this appearance. CT angiogram of the chest may be considered for further evaluation. These results will be called to the ordering clinician or representative by the Radiologist Assistant, and communication documented in the PACS or zVision Dashboard. Electronically Signed   By: Ted Mcalpine M.D.   On: 12/15/2015 09:17    Microbiology: No results found for this or any previous visit (from the past 240 hour(s)).   Labs: Basic Metabolic Panel:  Recent Labs Lab 12/14/15 1618 12/15/15 0400 12/16/15 0322 12/17/15 0445 12/18/15 0328 12/19/15 0325  NA 133* 139 137 135 134* 137  K 4.5 4.7 4.4 3.8 3.7 4.2  CL 99* 101 101 104 101 106  CO2 GLUCOSE 152* 122* 118* 106* 94 110*  BUN 19 19 22* CREATININE 0.92 0.90 1.03* 0.86 0.88 0.96  CALCIUM 9.7 9.8 9.3 8.9 9.0 9.2  MG 2.3  --   --   --   --   --    Liver Function Tests:  Recent Labs Lab 12/14/15 1618 12/16/15 0322  AST 14* 14*  ALT 14 13*  ALKPHOS 91 87  BILITOT 0.6 0.7  PROT 8.0 7.4  ALBUMIN 3.9 3.8   No results for input(s): LIPASE, AMYLASE in the last 168 hours. No results for input(s): AMMONIA in the last 168 hours. CBC:  Recent Labs Lab 12/15/15 0400 12/16/15 0322 12/17/15 0445 12/18/15 0328 12/18/15 1200 12/19/15 0325  WBC 16.1* 16.2* 13.2* 11.5*  --  13.7*  NEUTROABS  --   --   --   --  9.4*  --   HGB 12.3 12.7 11.9* 12.2  --  11.7*  HCT 40.3 40.9 37.9 37.4  --  38.1  MCV 81.6 81.2 80.0 79.6  --  80.5  PLT 337 380 336 274  --  309   Cardiac Enzymes:  Recent Labs Lab 12/14/15 1618 12/14/15 2123 12/15/15 0400  TROPONINI 0.03* <0.03 <0.03   BNP: BNP (last 3 results) No results for input(s): BNP in the last 8760 hours.  ProBNP (last 3 results) No results for input(s): PROBNP in the last 8760 hours.  CBG: No results for input(s): GLUCAP in the last 168  hours.     SignedEdsel Petrin  [facility] 12/19/2015, 12:03 PM

## 2015-12-19 NOTE — Progress Notes (Signed)
Subjective:  Doing well following cardioversion.  No complaints of shortness of breath or chest pain.  Objective:  Vital Signs in the last 24 hours: BP 114/75 mmHg  Pulse 88  Temp(Src) 98.2 F (36.8 C) (Oral)  Resp 16  Ht 5\' 3"  (1.6 m)  Wt 108.6 kg (239 lb 6.7 oz)  BMI 42.42 kg/m2  SpO2 100%  Physical Exam: Obese pleasant female in no acute distress Lungs:  Clear Cardiac:  Regular rhythm, normal S1 and S2, no S3 Abdomen:  Soft, nontender, no masses Extremities:  No edema present  Intake/Output from previous day: 07/14 0701 - 07/15 0700 In: 303 [I.V.:303] Out: 1 [Blood:1]  Weight Filed Weights   12/17/15 0330 12/18/15 0446 12/19/15 0330  Weight: 109.453 kg (241 lb 4.8 oz) 108.863 kg (240 lb) 108.6 kg (239 lb 6.7 oz)    Lab Results: Basic Metabolic Panel:  Recent Labs  75/44/92 0328 12/19/15 0325  NA 134* 137  K 3.7 4.2  CL 101 106  CO2 23 23  GLUCOSE 94 110*  BUN 13 14  CREATININE 0.88 0.96   CBC:  Recent Labs  12/18/15 0328 12/18/15 1200 12/19/15 0325  WBC 11.5*  --  13.7*  NEUTROABS  --  9.4*  --   HGB 12.2  --  11.7*  HCT 37.4  --  38.1  MCV 79.6  --  80.5  PLT 274  --  309   Telemetry: Normal sinus rhythm overnight   Assessment/Plan:  1.  Nonischemic cardiomyopathy-possibly tachycardia-induced negative coronary arteries 2.  Atrial fibrillation currently maintaining sinus rhythm 3.  Recent initiation of anticoagulation 4.  Obesity  Recommendations:  Okay for discharge from cardiac viewpoint.  What to do about the mediastinal mass has not been defined yet and the plans for this will need to be defined prior to discharge.  She has not been started on ACE inhibitor and will begin lisinopril 5 mg daily.  Change metoprolol to metoprolol succinate 50 mg twice daily and continue amiodarone 400 mg twice daily for another week and then reduce to 200 mg twice a day.  Discharge on Eliquis 5 mg twice daily.  Follow-up in one week after discharge.  The  patient if she lives in [place] could probably follow up with the [facility] medical group heart practice in [place] for convenience.  She will need to have a repeat echo in around 3 months to see if her LV function has improved.  Darden Palmer  MD [facility]  12/19/2015, 10:50 AM

## 2015-12-19 NOTE — Progress Notes (Signed)
Discharged to home with family office visits in place teaching done  

## 2015-12-21 ENCOUNTER — Encounter (HOSPITAL_COMMUNITY): Payer: Self-pay | Admitting: Cardiology

## 2015-12-21 ENCOUNTER — Telehealth: Payer: Self-pay | Admitting: Pulmonary Disease

## 2015-12-21 LAB — PROTEIN ELECTROPHORESIS, SERUM
A/G Ratio: 0.9 (ref 0.7–1.7)
ALBUMIN ELP: 3.2 g/dL (ref 2.9–4.4)
Alpha-1-Globulin: 0.2 g/dL (ref 0.0–0.4)
Alpha-2-Globulin: 0.9 g/dL (ref 0.4–1.0)
BETA GLOBULIN: 1.2 g/dL (ref 0.7–1.3)
GAMMA GLOBULIN: 1 g/dL (ref 0.4–1.8)
Globulin, Total: 3.4 g/dL (ref 2.2–3.9)
TOTAL PROTEIN ELP: 6.6 g/dL (ref 6.0–8.5)

## 2015-12-21 NOTE — Telephone Encounter (Signed)
LMTCB x 1 

## 2015-12-22 ENCOUNTER — Telehealth (HOSPITAL_COMMUNITY): Payer: Self-pay | Admitting: Surgery

## 2015-12-22 NOTE — Telephone Encounter (Signed)
Heart Failure Nurse Navigator Post Discharge Telephone Call   I called and spoke with Samantha Turner after her recent hospitalization.  She tells me that she has been weighing each day and weight today is 239 which is the same as the day of discharge.  She denies any SOB or increased swelling.  Her only complaint is that she remains weak.  She denies any issues with getting or taking newly prescribed medications.  She has follow-up scheduled 7/26 at [facility].  I encouraged her to call me with any concerns or questions regarding her new HF diagnosis.

## 2015-12-22 NOTE — Telephone Encounter (Signed)
Called and spoke with pts husband and he stated that he needs an appt with BQ for his wife for a HFU.  He stated that they told him to follow up in 1 week and BQ is booked out.  Morrie Sheldon please advise about an appt we can work pt into please.  thanks

## 2015-12-22 NOTE — Telephone Encounter (Signed)
BQ you have no openings in the next 2 weeks for a hospital follow up.  OK to see a NP, or can they be worked in to a 15-minute appt time?  Pt has never been seen in clinic before.  Thanks!

## 2015-12-23 NOTE — Telephone Encounter (Signed)
This is not urgent. Can see an NP

## 2015-12-23 NOTE — Telephone Encounter (Signed)
LMTCB- please make HFU with NP when she calls back

## 2015-12-23 NOTE — Telephone Encounter (Signed)
Patient husband cb and rescheduled appt for wife for 01/06/2016 @ 11am with BO, nothing further needed

## 2015-12-24 ENCOUNTER — Encounter: Payer: Self-pay | Admitting: Physician Assistant

## 2015-12-27 NOTE — Progress Notes (Signed)
Cardiology Office Note    Date:  12/30/2015   ID:  Samantha Turner, DOB 02-Jun-1961, MRN [id number]  PCP:  Kirstie Peri, MD  Cardiologist:  Dr. Duke Salvia but wants to follow in [place].   CC: post hospital follow up.   History of Present Illness:  Samantha Turner is a 55 y.o. female with a history of obesity, NICM/ chronic combined S/D CHF (EF 15-20%), PAFlutter on amio and eliquis and HTN who presents to clinic for post hospital follow up.   She was recently admitted from 7/10-7/15/17 for new onset heart failure and PAFlutter. She initially presented to [facility] with syncope, chest pain and SOB. She had a CT angiogram the chest that was negative for for pulmonary embolism but did note mediastinal and hilar lymphadenopathy. There is also a mediastinal mass measuring 3.8 x 3.5 x 5.9 cm in the subcarinal mass. There was concern for sarcoidosis or lymphoma. She was transferred to [facility] for further eval and treatment.  2D ECHO showed LVEF 15-20% with diffuse hypokinesis. There was akinesis of the inferior myocardium, G2DD and trivial pericardial effusion. She was also found to be in atrial flutter with RVR. She was initially started on a dilt gtt which was later changed to Toprol XL given her significant LV dysfunction. She underwent LHC on 12/16/15 which showed normal coronary arteries. Her LV dysfunction was felt to be NICM likely from tachycardia. She underwent TEE/DCCV on 12/18/15 and was started on an amiodarone load as well as Eliquis 5mg  BID. She was diuresed with IV lasix and discharged on lasix 20mg  daily.   Today she presents for post hospital follow up. She has been feeling a little weak and sometimes dizzy, mostly when standing from sitting . No CP or SOB. No LE edema, orthopnea or PND. No palpitations. No syncope.   Past Medical History:  Diagnosis Date  . Anxiety   . Atrial flutter (HCC) 12/16/2015  . Depression   . Headache   . Sinus tachycardia (HCC)     Past Surgical History:    Procedure Laterality Date  . ABDOMINAL HYSTERECTOMY    . CARDIAC CATHETERIZATION N/A 12/16/2015   Procedure: Left Heart Cath and Coronary Angiography;  Surgeon: Iran Ouch, MD;  Location: [facility] INVASIVE CV LAB;  Service: Cardiovascular;  Laterality: N/A;  . CARDIOVERSION N/A 12/18/2015   Procedure: CARDIOVERSION;  Surgeon: Lars Masson, MD;  Location: [facility] ENDOSCOPY;  Service: Cardiovascular;  Laterality: N/A;  . CHOLECYSTECTOMY    . TEE WITHOUT CARDIOVERSION N/A 12/18/2015   Procedure: TRANSESOPHAGEAL ECHOCARDIOGRAM (TEE);  Surgeon: Lars Masson, MD;  Location: [facility] ENDOSCOPY;  Service: Cardiovascular;  Laterality: N/A;  . TUMOR REMOVAL  07/02/2015   ovarian    Current Medications: Outpatient Medications Prior to Visit  Medication Sig Dispense Refill  . amiodarone (PACERONE) 200 MG tablet Take 1 tablet (200 mg total) by mouth daily. 30 tablet 0  . apixaban (ELIQUIS) 5 MG TABS tablet Take 1 tablet (5 mg total) by mouth 2 (two) times daily. 60 tablet 0  . atorvastatin (LIPITOR) 40 MG tablet Take 1 tablet (40 mg total) by mouth daily at 6 PM. 30 tablet 0  . furosemide (LASIX) 20 MG tablet Take 1 tablet (20 mg total) by mouth daily. 30 tablet 0  . lamoTRIgine (LAMICTAL) 100 MG tablet Take 150 mg by mouth daily.     Marland Kitchen lisinopril (PRINIVIL,ZESTRIL) 5 MG tablet Take 1 tablet (5 mg total) by mouth daily. 30 tablet 0  . metoprolol succinate (TOPROL-XL) 50  MG 24 hr tablet Take 1 tablet (50 mg total) by mouth 2 (two) times daily with a meal. Take with or immediately following a meal. 60 tablet 0  . naproxen sodium (ALEVE) 220 MG tablet Take 440 mg by mouth daily as needed (pain).    Marland Kitchen venlafaxine (EFFEXOR) 75 MG tablet Take 75 mg by mouth 2 (two) times daily.    Marland Kitchen amiodarone (PACERONE) 400 MG tablet Take 1 tablet (400 mg total) by mouth 2 (two) times daily. 14 tablet 0  . HYDROcodone-acetaminophen (NORCO) 5-325 MG per tablet Take 1 tablet by mouth every 4 (four) hours as needed for moderate  pain. 15 tablet 0   No facility-administered medications prior to visit.      Allergies:   Review of patient's allergies indicates no known allergies.   Social History   Social History  . Marital status: Single    Spouse name: N/A  . Number of children: N/A  . Years of education: N/A   Social History Main Topics  . Smoking status: Never Smoker  . Smokeless tobacco: Never Used  . Alcohol use Yes     Comment: rarel  . Drug use: No  . Sexual activity: Yes    Birth control/ protection: Surgical   Other Topics Concern  . None   Social History Narrative  . None     Family History:  The patient's family history includes Heart failure in her mother.     ROS:   Please see the history of present illness.    ROS All other systems reviewed and are negative.   PHYSICAL EXAM:   VS:  BP 120/86 (BP Location: Right Arm, Patient Position: Sitting, Cuff Size: Large)   Pulse 70   Ht 5\' 3"  (1.6 m)   Wt 241 lb (109.3 kg)   BMI 42.69 kg/m    GEN: Well nourished, well developed, in no acute distress obese HEENT: normal  Neck: no JVD, carotid bruits, or masses Cardiac: RRR; no murmurs, rubs, or gallops,no edema  Respiratory:  clear to auscultation bilaterally, normal work of breathing GI: soft, nontender, nondistended, + BS MS: no deformity or atrophy  Skin: warm and dry, no rash Neuro:  Alert and Oriented x 3, Strength and sensation are intact Psych: euthymic mood, full affect  Wt Readings from Last 3 Encounters:  12/30/15 241 lb (109.3 kg)  12/19/15 239 lb 6.7 oz (108.6 kg)  09/03/13 256 lb 11.2 oz (116.4 kg)      Studies/Labs Reviewed:   EKG:  EKG is ordered today.  The ekg ordered today demonstrates NSR, low voltage QRS HR 70  Recent Labs: 12/14/2015: Magnesium 2.3 12/15/2015: TSH 1.170 12/16/2015: ALT 13 12/19/2015: BUN 14; Creatinine, Ser 0.96; Hemoglobin 11.7; Platelets 309; Potassium 4.2; Sodium 137   Lipid Panel    Component Value Date/Time   CHOL 214 (H)  12/16/2015 0322   TRIG 399 (H) 12/16/2015 0322   HDL 25 (L) 12/16/2015 0322   CHOLHDL 8.6 12/16/2015 0322   VLDL 80 (H) 12/16/2015 0322   LDLCALC 109 (H) 12/16/2015 0322    Additional studies/ records that were reviewed today include:  Echo 12/15/15: Study Conclusions - Left ventricle: The cavity size was moderately dilated. Wall  thickness was normal. Systolic function was severely reduced. The  estimated ejection fraction was in the range of 15% to 20%.  Diffuse hypokinesis. Akinesis of the inferior myocardium. There  was fusion of early and atrial contributions to ventricular  filling. Features are consistent with  a pseudonormal left  ventricular filling pattern, with concomitant abnormal relaxation  and increased filling pressure (grade 2 diastolic dysfunction). - Mitral valve: There was mild to moderate regurgitation. - Left atrium: The atrium was mildly dilated. - Right atrium: The atrium was mildly dilated. - Pericardium, extracardiac: A trivial pericardial effusion was  identified.  LHC 12/16/15 Conclusion   There is severe left ventricular systolic dysfunction.   1. Normal coronary arteries. The LAD and left circumflex has 2 separate ostium. 2. Dilated left ventricle with severely reduced LV systolic function.  mildly elevated left ventricular end-diastolic pressure. Recommendations:  The patient has nonischemic cardiomyopathy likely tachycardia induced. I agree with rate control, anticoagulation and proceeding with TEE guided cardioversion. Oral anticoagulation with a NOAC can be started tomorrow morning.     2D ECHO: 12/18/2015 LV EF: 20% -   25% Study Conclusions - Left ventricle: The cavity size was moderately dilated. Systolic   function was severely reduced. The estimated ejection fraction   was in the range of 20% to 25%. Diffuse hypokinesis. - Aortic valve: No evidence of vegetation. - Left atrium: The atrium was moderately dilated. No evidence of    thrombus in the atrial cavity or appendage. No evidence of   thrombus in the atrial cavity or appendage. - Right ventricle: The cavity size was moderately dilated. Wall   thickness was normal. Systolic function was moderately reduced. - Right atrium: The atrium was mildly dilated. No evidence of   thrombus in the atrial cavity or appendage. - Atrial septum: No defect or patent foramen ovale was identified.   Echo contrast study showed no right-to-left atrial level shunt,   following an increase in RA pressure induced by provocative   maneuvers. - Pulmonic valve: No evidence of vegetation. Impressions: - No intracardiac thrombus was identified. A TEE was followed by a   successful cardioversion.   ASSESSMENT & PLAN:   Acute combined systolic and diastolic congestive heart failure/NICM:  - 2D ECHO (12/15/15) revealed severely reduced EF of 15-20% with G2DD - Felt to be tachycardia induced CM, s/p cardiac catheterization with normal coronaries  - Continue lasix  daily,  Lisinopril  daily, Toprol XL  BID and lasix  daily. She appears euvolemic -- Will need repeat 2D ECHO in 3 months to see if LV function has improved. Will have this set up for mid October. If LV function remains <35% will need to be seen by EP for evaluation of ICD for primary prevention.   Atrial flutter: s/p TEE/DCCV 7/14, currently in sinus rhythm -CHADSVasc score of at least 3. She has been on Eliquis  BID, but this is >160$. Xarelto is free under their insurance so we will switch to that.  -Started on amiodarone  BID for one week, reduce to  daily thereafter  Hilar adenopathy: CT scan of lungs performed at outside hospital revealing hilar adenopathy. Their radiologist included sarcoid and lymphoma in the differential. Previous hospitalist discussed case with Dr. Delton Coombes of pulmonary critical care medicine -Will see PCCM in early august.   Depression: continue lamictal and  effexor  Medication Adjustments/Labs and Tests Ordered: Current medicines are reviewed at length with the patient today.  Concerns regarding medicines are outlined above.  Medication changes, Labs and Tests ordered today are listed in the Patient Instructions below. There are no Patient Instructions on file for this visit.   Signed, Cline Crock, PA-C  12/30/2015 11:52 AM    [facility] [address] [place], [place], [place]  [address] Phone: [phone]; Fax: [phone]

## 2015-12-30 ENCOUNTER — Encounter (INDEPENDENT_AMBULATORY_CARE_PROVIDER_SITE_OTHER): Payer: Self-pay

## 2015-12-30 ENCOUNTER — Encounter: Payer: Self-pay | Admitting: Physician Assistant

## 2015-12-30 ENCOUNTER — Ambulatory Visit (INDEPENDENT_AMBULATORY_CARE_PROVIDER_SITE_OTHER): Payer: 59 | Admitting: Physician Assistant

## 2015-12-30 VITALS — BP 120/86 | HR 70 | Ht 63.0 in | Wt 241.0 lb

## 2015-12-30 DIAGNOSIS — I429 Cardiomyopathy, unspecified: Secondary | ICD-10-CM

## 2015-12-30 DIAGNOSIS — R079 Chest pain, unspecified: Secondary | ICD-10-CM | POA: Diagnosis not present

## 2015-12-30 DIAGNOSIS — I428 Other cardiomyopathies: Secondary | ICD-10-CM

## 2015-12-30 MED ORDER — APIXABAN 5 MG PO TABS: 5.0000 mg | ORAL_TABLET | Freq: Two times a day (BID) | ORAL | 3 refills | Status: DC

## 2015-12-30 MED ORDER — ATORVASTATIN CALCIUM 40 MG PO TABS: 40.0000 mg | ORAL_TABLET | Freq: Every day | ORAL | 3 refills | Status: DC

## 2015-12-30 MED ORDER — METOPROLOL SUCCINATE ER 50 MG PO TB24: 50.0000 mg | ORAL_TABLET | Freq: Two times a day (BID) | ORAL | 3 refills | Status: DC

## 2015-12-30 MED ORDER — FUROSEMIDE 20 MG PO TABS: 20.0000 mg | ORAL_TABLET | Freq: Every day | ORAL | 3 refills | Status: DC

## 2015-12-30 MED ORDER — AMIODARONE HCL 200 MG PO TABS: 200.0000 mg | ORAL_TABLET | Freq: Every day | ORAL | 3 refills | Status: DC

## 2015-12-30 MED ORDER — LISINOPRIL 5 MG PO TABS: 5.0000 mg | ORAL_TABLET | Freq: Every day | ORAL | 3 refills | Status: DC

## 2015-12-30 NOTE — Patient Instructions (Addendum)
Medication Instructions:  Your physician has recommended you make the following change in your medication:  1.  STOP Eliquis and take Xarelto 20 mg daily   Labwork: None ordered  Testing/Procedures: Your physician has requested that you have an echocardiogram. Echocardiography is a painless test that uses sound waves to create images of your heart. It provides your doctor with information about the size and shape of your heart and how well your heart's chambers and valves are working. This procedure takes approximately one hour. There are no restrictions for this procedure.    Follow-Up: Your physician recommends that you schedule a follow-up appointment in: 2 WEEKS AFTER ECHO IN [place]    Any Other Special Instructions Will Be Listed Below (If Applicable).  Echocardiogram An echocardiogram, or echocardiography, uses sound waves (ultrasound) to produce an image of your heart. The echocardiogram is simple, painless, obtained within a short period of time, and offers valuable information to your health care provider. The images from an echocardiogram can provide information such as:  Evidence of coronary artery disease (CAD).  Heart size.  Heart muscle function.  Heart valve function.  Aneurysm detection.  Evidence of a past heart attack.  Fluid buildup around the heart.  Heart muscle thickening.  Assess heart valve function. LET [facility] CARE PROVIDER KNOW ABOUT:  Any allergies you have.  All medicines you are taking, including vitamins, herbs, eye drops, creams, and over-the-counter medicines.  Previous problems you or members of your family have had with the use of anesthetics.  Any blood disorders you have.  Previous surgeries you have had.  Medical conditions you have.  Possibility of pregnancy, if this applies. BEFORE THE PROCEDURE  No special preparation is needed. Eat and drink normally.  PROCEDURE   In order to produce an image of your heart, gel  will be applied to your chest and a wand-like tool (transducer) will be moved over your chest. The gel will help transmit the sound waves from the transducer. The sound waves will harmlessly bounce off your heart to allow the heart images to be captured in real-time motion. These images will then be recorded.  You may need an IV to receive a medicine that improves the quality of the pictures. AFTER THE PROCEDURE You may return to your normal schedule including diet, activities, and medicines, unless your health care provider tells you otherwise.   This information is not intended to replace advice given to you by your health care provider. Make sure you discuss any questions you have with your health care provider.   Document Released: 05/20/2000 Document Revised: 06/13/2014 Document Reviewed: 01/28/2013 Elsevier Interactive Patient Education [employer]     If you need a refill on your cardiac medications before your next appointment, please call your pharmacy.

## 2016-01-06 ENCOUNTER — Encounter: Payer: Self-pay | Admitting: Pulmonary Disease

## 2016-01-06 ENCOUNTER — Ambulatory Visit (INDEPENDENT_AMBULATORY_CARE_PROVIDER_SITE_OTHER): Payer: 59 | Admitting: Pulmonary Disease

## 2016-01-06 VITALS — BP 110/74 | HR 64 | Ht 63.0 in | Wt 238.4 lb

## 2016-01-06 DIAGNOSIS — Q330 Congenital cystic lung: Secondary | ICD-10-CM

## 2016-01-06 DIAGNOSIS — I509 Heart failure, unspecified: Secondary | ICD-10-CM

## 2016-01-06 DIAGNOSIS — G4733 Obstructive sleep apnea (adult) (pediatric): Secondary | ICD-10-CM | POA: Insufficient documentation

## 2016-01-06 DIAGNOSIS — J984 Other disorders of lung: Secondary | ICD-10-CM

## 2016-01-06 DIAGNOSIS — Q341 Congenital cyst of mediastinum: Secondary | ICD-10-CM

## 2016-01-06 NOTE — Progress Notes (Signed)
Reviewed, I agree with this plan of care 

## 2016-01-06 NOTE — Patient Instructions (Signed)
1.  We will arrange for a sleep study to assess you for sleep apnea  2.  We will also contact your Cardiologist to confirm if they want to assess you with a cardiac MRI. If they do not want this, we will arrange for a CT of the chest in 3 months with follow up visit to review the prior CT findings (thought to be a bronchogenic cyst). 3.  If you develop new or worsening symptoms please call to be seen - SOB, chest pain, pain with inspiration, dizziness, lightheadedness, palpitations

## 2016-01-06 NOTE — Progress Notes (Signed)
[facility]   Chief Complaint  Patient presents with  . Hospitalization Follow-up    Her CP has resolved. She denies any respiratoy co's today.      Current Outpatient Prescriptions on File Prior to Visit  Medication Sig  . amiodarone (PACERONE) 200 MG tablet Take 1 tablet (200 mg total) by mouth daily.  Marland Kitchen apixaban (ELIQUIS) 5 MG TABS tablet Take 1 tablet (5 mg total) by mouth 2 (two) times daily.  Marland Kitchen atorvastatin (LIPITOR) 40 MG tablet Take 1 tablet (40 mg total) by mouth daily at 6 PM.  . furosemide (LASIX) 20 MG tablet Take 1 tablet (20 mg total) by mouth daily.  Marland Kitchen lamoTRIgine (LAMICTAL) 100 MG tablet Take 150 mg by mouth daily.   Marland Kitchen lisinopril (PRINIVIL,ZESTRIL) 5 MG tablet Take 1 tablet (5 mg total) by mouth daily.  . metoprolol succinate (TOPROL-XL) 50 MG 24 hr tablet Take 1 tablet (50 mg total) by mouth 2 (two) times daily with a meal. Take with or immediately following a meal.  . naproxen sodium (ALEVE) 220 MG tablet Take 440 mg by mouth daily as needed (pain).  Marland Kitchen venlafaxine (EFFEXOR) 75 MG tablet Take 75 mg by mouth 2 (two) times daily.   No current facility-administered medications on file prior to visit.      Studies: 12/15/15  CXR >> Mild widening of the mediastinal contour with prominence of the contour of the ascending aorta. This may represent overlapping vascular shadows, however aneurysmal dilation of the ascending aorta cannot be excluded with this appearance. CT angiogram of the chest may be considered for further evaluation.  12/21/15  ECHO >> LVEF 15-20%, diffuse hypokinesis, akinesis of inferior myocardium, grade 2 diastolic dysfunction, trivial pericardial effusion  12/16/15  LHC >> severe left ventricular systolic dysfunction, normal coronary arteries.  Likely non-ischemic cardiomyopathy that is tachycardia induced  Past Medical Hx:  has a past medical history of Anxiety; Atrial flutter (HCC) (12/16/2015); Depression; Headache; and Sinus tachycardia  (HCC).   Past Surgical hx, Allergies, Family hx, Social hx all reviewed.  Vital Signs BP 110/74 (BP Location: Left Arm, Cuff Size: Large)   Pulse 64   Ht 5\' 3"  (1.6 m)   Wt 238 lb 6.4 oz (108.1 kg)   SpO2 99%   BMI 42.23 kg/m   History of Present Illness Samantha Turner is a 55 y.o. female with a history of depression, chest pain, HTN, recent admission (7/10-7/15) for chest pain, shortness of breath and syncope.    Hospital findings - non-ischemic cardiomyopathy (EF 15-20%) secondary to atrial flutter/fib s/p TEE cardioversion on Amio / Eliquis (planned changed to Xarelto with insurance), negative coronaries on LHC, concern for OSA and bronchogenic cyst (found on outside hospital CT scan, 3.8 x 3.5 x5.9 subcarinal mass)   She returns 8/2 to the pulmonary office for hospital follow up. At discharge, there was discussion for cardiac MRI follow up and this was going to be utilized for follow up of mediastinal mass.   The patient reports she is slowly returning to normal.  She has been able to perform ADL's without difficulty.  Denies palpitations, chest pain, SOB, pain with inspiration. She does report some weakness but overall improved.  She was able to walk around [employer] shopping for about an hour before becoming tired.    OSA - husband reports she snores but not every night. She denies frequent waking, reports increased daytime sleepiness since hospitalization but resolves with napping.   Dr. Sherryll Burger PCP @ [facility] internal medicine  Physical Exam  General - well developed adult F in no acute distress ENT - No sinus tenderness, no oral exudate, no LAN Cardiac - s1s2 regular, no murmur Chest - even/non-labored, lungs bilaterally . No wheeze/rales Back - No focal tenderness Abd - Soft, non-tender Ext - No edema Neuro - Normal strength Skin - No rashes Psych - normal mood, and behavior   Assessment/Plan  Abnormal CT Chest / Mediastinal Mass - per Radiology, appears to be  a bronchogenic cyst. Inpatient, she was recommended to have a cardiac MRI and this would also review mediastinal mass.  It does not appear that this has been scheduled.  Plan: No indication for biopsy until cardiac MRI or further imaging obtained  Will touch base with Dr. Delton See / Dr. Diona Browner to determine if cardiac MRI still a need. If not, will follow up with a CT scan of the chest in 3 months to review mediastinal mass  OSA   Plan: Arrange for PSG to evaluate for OSA  Patient Instructions  1.  We will arrange for a sleep study to assess you for sleep apnea  2.  We will also contact your Cardiologist to confirm if they want to assess you with a cardiac MRI. If they do not want this, we will arrange for a CT of the chest in 3 months with follow up visit to review the prior CT findings (thought to be a bronchogenic cyst). 3.  If you develop new or worsening symptoms please call to be seen - SOB, chest pain, pain with inspiration, dizziness, lightheadedness, palpitations    Canary Brim, NP-C [facility]  [phone] 01/06/2016, 12:34 PM

## 2016-01-10 NOTE — Progress Notes (Signed)
Samantha Turner, Could you schedule a cardiac MRI for this patient at the end of August? (6 weeks post her initial diagnosis of CHF). The reason for cardiac MRI would be : mediastinal cyst and CHF. Thank you! Aris Lot

## 2016-01-11 ENCOUNTER — Telehealth: Payer: Self-pay | Admitting: Pulmonary Disease

## 2016-01-11 NOTE — Addendum Note (Signed)
Addended by: Loa Socks on: 01/11/2016 08:06 AM   Modules accepted: Orders

## 2016-01-11 NOTE — Telephone Encounter (Signed)
Duplicate.    Closing.

## 2016-01-11 NOTE — Telephone Encounter (Signed)
Attempted to contact patient, left message for patient to return call.

## 2016-01-11 NOTE — Progress Notes (Signed)
Cardiac MRI order is in the system and will send [facility] scheduling a message to call this pt and arrange for her to have a cardiac MRI for mediastinal cyst and CHF, for the end of August 2017.  This study should be scheduled for Dr Delton See to read on her reader day.

## 2016-01-11 NOTE — Telephone Encounter (Signed)
Patient notified.  Nothing further needed. 

## 2016-01-11 NOTE — Telephone Encounter (Signed)
Marcelino Duster,   Please call Mrs. Utt and let her know that I spoke with Dr. Delton See (Cardiology) and that she will have a cardiac MRI at the end of August that they will schedule (to be completed after 6 weeks of CHF therapy).  This will also look at her suspected bronchogenic cyst.  We will follow up on this after the MRI is completed.    Thank you,   Canary Brim, NP-C [facility] Pgr: [phone] or if no answer [phone] 01/11/2016, 8:54 AM

## 2016-01-13 ENCOUNTER — Telehealth: Payer: Self-pay | Admitting: Physician Assistant

## 2016-01-13 MED ORDER — RIVAROXABAN 20 MG PO TABS: 20.0000 mg | ORAL_TABLET | Freq: Every day | ORAL | 3 refills | Status: DC

## 2016-01-13 NOTE — Telephone Encounter (Signed)
Pt was seen by Merrilee Jansky on 7/26 post- hospital. Pt states his taking Metoprolol succinate 50 (Toprol XL) BID. Pt said that she can't afford this medication . She has to pay $100. every time she has to refill this medication. Pt would like for MD to prescribed some other medication that she can afford. Pt's cardiologist is Dr. Nona Dell. Pt has a F/U appointment on 04/11/16 with him. Pt has only Metoprolol succinate 50 mg dose for tonight only.  Merrilee Jansky PA-C is not in the office today. Pt needs to be called today with recommendations.  Note: Xarelto 20 mg sent to [facility] drugs today. Order for the changed was placed by K. Geralynn Rile on 12/30/15

## 2016-01-13 NOTE — Telephone Encounter (Signed)
Message sent to Dr. Purvis Sheffield as SM is out of office.  Please advise on medication change.    Attempted to notify patient - left info on voicemail that message sent to provider for further advice.

## 2016-01-13 NOTE — Telephone Encounter (Signed)
New message      Pt cannot afford metoprolol succinate.  It is a tier 3 and the copay is 100.00. Please call in something else cheaper.  Also, pt want xarelto instead of eliquis called in to [facility].  Pt has one blood thinner pill for today but will need medication tomorrow. Please call if there is a problem

## 2016-01-14 NOTE — Telephone Encounter (Signed)
I have not met this patient yet in the office - I reviewed recent note per Ms. Samantha Turner. Suggest find out costs to her for Coreg, bisoprolol,and also atenolol as it compares to Toprol XL. We can decide about a switch from there.

## 2016-01-14 NOTE — Telephone Encounter (Signed)
Call placed to [facility] regarding medications covered.  The Metoprolol Succinate was a Tier 2.  Cost for 30 day was $34.19 & 90 day was $153.00.    Suggestions that are a Tier 1 -   Carvedilol 3.125, 6.25, 12.5, & 25mg   Atenolol 25, 50, & 100.   Message fwd back to Dr. Diona Browner for decision on switching medication.

## 2016-01-14 NOTE — Telephone Encounter (Signed)
Returned call to husband Leonette Most).  Stated that the Toprol is a Tier 3 on his plan [facility]).  Informed husband that nurse will research his formulary for suggestions that Dr. Diona Browner made & get back with him later this evening.

## 2016-01-15 MED ORDER — CARVEDILOL 12.5 MG PO TABS: 12.5000 mg | ORAL_TABLET | Freq: Two times a day (BID) | ORAL | 3 refills | Status: DC

## 2016-01-15 NOTE — Telephone Encounter (Signed)
Let's switch from Toprol XL 50 mg BID to Coreg 12.5 mg BID. Keep an eye on BP and HR.

## 2016-01-15 NOTE — Telephone Encounter (Signed)
Patient informed and verbalized understanding of plan. 

## 2016-01-18 ENCOUNTER — Encounter: Payer: Self-pay | Admitting: Cardiology

## 2016-01-19 ENCOUNTER — Telehealth: Payer: Self-pay | Admitting: Pulmonary Disease

## 2016-01-19 NOTE — Telephone Encounter (Signed)
Samantha Turner,    Please schedule a follow up appointment for Samantha Turner after 8/28 with Dr. Kendrick Fries for review of MRI findings (suspected bronchogenic cyst).    Thank you,  Merry Proud

## 2016-01-19 NOTE — Telephone Encounter (Signed)
Patient scheduled to see Dr. Kendrick Fries on 03/05/16 at 2:45pm for MRI results.  Patient aware of appointment. Nothing further needed.  FYI to [employer]

## 2016-02-01 ENCOUNTER — Ambulatory Visit (HOSPITAL_COMMUNITY)
Admission: RE | Admit: 2016-02-01 | Discharge: 2016-02-01 | Disposition: A | Discharge status: Home / Self Care | Payer: 59 | Source: Ambulatory Visit | Attending: Cardiology | Admitting: Cardiology

## 2016-02-01 DIAGNOSIS — I420 Dilated cardiomyopathy: Secondary | ICD-10-CM | POA: Insufficient documentation

## 2016-02-01 DIAGNOSIS — Q341 Congenital cyst of mediastinum: Secondary | ICD-10-CM | POA: Insufficient documentation

## 2016-02-01 DIAGNOSIS — I429 Cardiomyopathy, unspecified: Secondary | ICD-10-CM

## 2016-02-01 DIAGNOSIS — J984 Other disorders of lung: Secondary | ICD-10-CM

## 2016-02-01 DIAGNOSIS — I517 Cardiomegaly: Secondary | ICD-10-CM | POA: Diagnosis not present

## 2016-02-01 DIAGNOSIS — I071 Rheumatic tricuspid insufficiency: Secondary | ICD-10-CM | POA: Diagnosis not present

## 2016-02-01 DIAGNOSIS — I509 Heart failure, unspecified: Secondary | ICD-10-CM | POA: Insufficient documentation

## 2016-02-01 DIAGNOSIS — Q330 Congenital cystic lung: Secondary | ICD-10-CM | POA: Insufficient documentation

## 2016-02-01 DIAGNOSIS — I340 Nonrheumatic mitral (valve) insufficiency: Secondary | ICD-10-CM | POA: Insufficient documentation

## 2016-02-01 MED ORDER — GADOBENATE DIMEGLUMINE 529 MG/ML IV SOLN
35.0000 mL | Freq: Once | INTRAVENOUS | Status: AC | PRN
  Administered 2016-02-01: 35 mL via INTRAVENOUS

## 2016-02-03 ENCOUNTER — Encounter: Payer: Self-pay | Admitting: Pulmonary Disease

## 2016-02-03 ENCOUNTER — Inpatient Hospital Stay: Payer: 59 | Admitting: Pulmonary Disease

## 2016-02-03 ENCOUNTER — Ambulatory Visit (INDEPENDENT_AMBULATORY_CARE_PROVIDER_SITE_OTHER): Payer: 59 | Admitting: Pulmonary Disease

## 2016-02-03 DIAGNOSIS — J984 Other disorders of lung: Secondary | ICD-10-CM

## 2016-02-03 DIAGNOSIS — Q330 Congenital cystic lung: Secondary | ICD-10-CM | POA: Diagnosis not present

## 2016-02-03 DIAGNOSIS — G4733 Obstructive sleep apnea (adult) (pediatric): Secondary | ICD-10-CM

## 2016-02-03 NOTE — Assessment & Plan Note (Signed)
Her MRI of the chest from this month showed that the mediastinal structure is fluid-filled consistent with a benign etiology. This is most likely a bronchogenic cyst.  This is explained to her and her husband at length today. No further imaging or biopsy necessary

## 2016-02-03 NOTE — Assessment & Plan Note (Signed)
She may have this condition considering her obesity and recent diagnosis of A. fib. I agree with the plan for polysomnogram. We'll follow that up.

## 2016-02-03 NOTE — Progress Notes (Signed)
Subjective:    Patient ID: Samantha Turner, female    DOB: 02/24/1961, 55 y.o.   MRN: [id number]  Synopsis: First seen by [facility] in the summer of 2017 when she was hospitalized for A. fib with RVR leading to nonischemic cardiomyopathy. At that time her evaluation demonstrated a mediastinal mass. On further discussion with radiology was felt that this represented a bronchogenic cyst. They recommended an MRI of the chest. She also has symptoms consistent with obstructive sleep apnea and in August 2017 a polysomnogram was ordered.  HPI Chief Complaint  Patient presents with  . Follow-up    review MRI per BO.  pt denies any breathing complaints currently.      Samantha Turner feels well.  No breathing problems, no cough.  She had a cardioversion and she believes she has been in sinus rhythm since. No leg swelling, no chest pain, weight has been stable.  Past Medical History:  Diagnosis Date  . Anxiety   . Atrial flutter (HCC) 12/16/2015  . Depression   . Headache   . Sinus tachycardia (HCC)       Review of Systems     Objective:   Physical Exam Vitals:   02/03/16 1445  BP: 134/82  Pulse: 71  SpO2: 99%  Weight: 240 lb (108.9 kg)  Height: 5\' 3"  (1.6 m)   RA  Gen: well appearing HENT: OP clear, TM's clear, neck supple PULM: CTA B, normal percussion CV: RRR, no mgr, trace edema GI: BS+, soft, nontender Derm: no cyanosis or rash Psyche: normal mood and affect   [facility] were reviewed in clinic today where she was seen in our clinic earlier in the month, an MRI was ordered and a polysomnogram was ordered  August 2017 MRI chest shows mildly dilated left ventricle with mild concentric hypertrophy LVEF 47% with diffuse hypokinesis, normal right ventricular size and function, mild biatrial dilation, mildly dilated pulmonary artery, most consistent with nonischemic dilated cardiomyopathy which had improved compared to the echocardiogram in July 2017, there was  an over read by Dr. Llana AlimentEntrikin who said that the 2.8 x 2.3 x 4.8 cm fluid signal intensity structure in the middle mediastinum appear to be benign. He felt it likely represented a bronchogenic cyst versus other benign causes.    Assessment & Plan:  OSA (obstructive sleep apnea) She may have this condition considering her obesity and recent diagnosis of A. fib. I agree with the plan for polysomnogram. We'll follow that up.  Bronchogenic cyst Her MRI of the chest from this month showed that the mediastinal structure is fluid-filled consistent with a benign etiology. This is most likely a bronchogenic cyst.  This is explained to her and her husband at length today. No further imaging or biopsy necessary    Current Outpatient Prescriptions:  .  amiodarone (PACERONE) 200 MG tablet, Take 1 tablet (200 mg total) by mouth daily., Disp: 90 tablet, Rfl: 3 .  atorvastatin (LIPITOR) 40 MG tablet, Take 1 tablet (40 mg total) by mouth daily at 6 PM., Disp: 90 tablet, Rfl: 3 .  carvedilol (COREG) 12.5 MG tablet, Take 1 tablet (12.5 mg total) by mouth 2 (two) times daily., Disp: 180 tablet, Rfl: 3 .  furosemide (LASIX) 20 MG tablet, Take 1 tablet (20 mg total) by mouth daily., Disp: 90 tablet, Rfl: 3 .  lamoTRIgine (LAMICTAL) 100 MG tablet, Take 150 mg by mouth daily. , Disp: , Rfl:  .  lisinopril (PRINIVIL,ZESTRIL) 5 MG tablet, Take 1 tablet (  5 mg total) by mouth daily., Disp: 90 tablet, Rfl: 3 .  naproxen sodium (ALEVE) 220 MG tablet, Take 440 mg by mouth daily as needed (pain)., Disp: , Rfl:  .  rivaroxaban (XARELTO) 20 MG TABS tablet, Take 1 tablet (20 mg total) by mouth daily with supper., Disp: 90 tablet, Rfl: 3 .  venlafaxine (EFFEXOR) 75 MG tablet, Take 75 mg by mouth 2 (two) times daily., Disp: , Rfl:

## 2016-02-03 NOTE — Patient Instructions (Signed)
We will see you in October after the sleep study

## 2016-02-21 ENCOUNTER — Ambulatory Visit (HOSPITAL_BASED_OUTPATIENT_CLINIC_OR_DEPARTMENT_OTHER): Payer: 59 | Attending: Pulmonary Disease | Admitting: Pulmonary Disease

## 2016-02-21 VITALS — Ht 63.0 in | Wt 240.0 lb

## 2016-02-21 DIAGNOSIS — G4733 Obstructive sleep apnea (adult) (pediatric): Secondary | ICD-10-CM | POA: Diagnosis not present

## 2016-02-21 DIAGNOSIS — R0683 Snoring: Secondary | ICD-10-CM | POA: Diagnosis present

## 2016-02-21 DIAGNOSIS — I493 Ventricular premature depolarization: Secondary | ICD-10-CM | POA: Diagnosis not present

## 2016-02-26 DIAGNOSIS — G4733 Obstructive sleep apnea (adult) (pediatric): Secondary | ICD-10-CM | POA: Diagnosis not present

## 2016-02-26 NOTE — Progress Notes (Signed)
Patient Name: Samantha Turner, Samantha Turner Date: 02/21/2016 Gender: Female D.O.B: 1960/12/15 Age (years): 41 Referring Provider: Jeanella Craze NP Height (inches): 63 Interpreting Physician: Cyril Mourning MD, ABSM Weight (lbs): 240 RPSGT: Wylie Hail BMI: 43 MRN: [id number] Neck Size: 16.00   CLINICAL INFORMATION Sleep Study Type: NPSG Indication for sleep study: snoring, non refreshing sleep Epworth Sleepiness Score: 5   SLEEP STUDY TECHNIQUE As per the AASM Manual for the Scoring of Sleep and Associated Events v2.3 (April 2016) with a hypopnea requiring 4% desaturations. The channels recorded and monitored were frontal, central and occipital EEG, electrooculogram (EOG), submentalis EMG (chin), nasal and oral airflow, thoracic and abdominal wall motion, anterior tibialis EMG, snore microphone, electrocardiogram, and pulse oximetry.   SLEEP ARCHITECTURE The study was initiated at 10:06:50 PM and ended at 4:23:12 AM. Sleep onset time was 34.4 minutes and the sleep efficiency was 68.4%. The total sleep time was 257.5 minutes. Stage REM latency was N/A minutes. The patient spent 12.62% of the night in stage N1 sleep, 87.18% in stage N2 sleep, 0.19% in stage N3 and 0.00% in REM. Alpha intrusion was absent. Supine sleep was 19.33%.   RESPIRATORY PARAMETERS The overall apnea/hypopnea index (AHI) was 17.2 per hour. There were 36 total apneas, including 36 obstructive, 0 central and 0 mixed apneas. There were 38 hypopneas and 55 RERAs. The AHI during Stage REM sleep was N/A per hour. AHI while supine was 38.6 per hour. The mean oxygen saturation was 92.83%. The minimum SpO2 during sleep was 87.00%. Loud snoring was noted during this study.   CARDIAC DATA The 2 lead EKG demonstrated sinus rhythm. The mean heart rate was 55.41 beats per minute. Other EKG findings include: PVCs.   LEG MOVEMENT DATA The total PLMS were 113 with a resulting PLMS index of 26.33. Associated arousal with leg  movement index was 2.1 .   IMPRESSIONS - Mild obstructive sleep apnea occurred during this study (AHI = 17.2/h). - No significant central sleep apnea occurred during this study (CAI = 0.0/h). - Mild oxygen desaturation was noted during this study (Min O2 = 87.00%). - The patient snored with Loud snoring volume. - EKG findings include PVCs. - Moderate periodic limb movements of sleep occurred during the study. No significant associated arousals.   DIAGNOSIS - Obstructive Sleep Apnea (327.23 [G47.33 ICD-10])   RECOMMENDATIONS - Therapeutic CPAP titration to determine optimal pressure required to alleviate sleep disordered breathing. - Positional therapy avoiding supine position during sleep. Mandibular advancement oral appliance can also be considered. - Avoid alcohol, sedatives and other CNS depressants that may worsen sleep apnea and disrupt normal sleep architecture. - Sleep hygiene should be reviewed to assess factors that may improve sleep quality. - Weight management and regular exercise should be initiated or continued if appropriate.    Cyril Mourning MD Board Certified in Sleep medicine

## 2016-02-26 NOTE — Procedures (Signed)
Patient Name: Samantha Turner, Samantha Turner Date: 02/21/2016 Gender: Female D.O.B: 04/19/1961 Age (years): 58 Referring Provider: Jeanella Craze NP Height (inches): 63 Interpreting Physician: Cyril Mourning MD, ABSM Weight (lbs): 240 RPSGT: Wylie Hail BMI: 43 MRN: [id number] Neck Size: 16.00 CLINICAL INFORMATION Sleep Study Type: NPSG Indication for sleep study: snoring, non refreshing sleep Epworth Sleepiness Score: 5   SLEEP STUDY TECHNIQUE As per the AASM Manual for the Scoring of Sleep and Associated Events v2.3 (April 2016) with a hypopnea requiring 4% desaturations. The channels recorded and monitored were frontal, central and occipital EEG, electrooculogram (EOG), submentalis EMG (chin), nasal and oral airflow, thoracic and abdominal wall motion, anterior tibialis EMG, snore microphone, electrocardiogram, and pulse oximetry. SLEEP ARCHITECTURE The study was initiated at 10:06:50 PM and ended at 4:23:12 AM. Sleep onset time was 34.4 minutes and the sleep efficiency was 68.4%. The total sleep time was 257.5 minutes. Stage REM latency was N/A minutes. The patient spent 12.62% of the night in stage N1 sleep, 87.18% in stage N2 sleep, 0.19% in stage N3 and 0.00% in REM. Alpha intrusion was absent. Supine sleep was 19.33%. RESPIRATORY PARAMETERS The overall apnea/hypopnea index (AHI) was 17.2 per hour. There were 36 total apneas, including 36 obstructive, 0 central and 0 mixed apneas. There were 38 hypopneas and 55 RERAs. The AHI during Stage REM sleep was N/A per hour. AHI while supine was 38.6 per hour. The mean oxygen saturation was 92.83%. The minimum SpO2 during sleep was 87.00%. Loud snoring was noted during this study. CARDIAC DATA The 2 lead EKG demonstrated sinus rhythm. The mean heart rate was 55.41 beats per minute. Other EKG findings include: PVCs. LEG MOVEMENT DATA The total PLMS were 113 with a resulting PLMS index of 26.33. Associated arousal with leg movement index was 2.1  . IMPRESSIONS - Mild obstructive sleep apnea occurred during this study (AHI = 17.2/h). - No significant central sleep apnea occurred during this study (CAI = 0.0/h). - Mild oxygen desaturation was noted during this study (Min O2 = 87.00%). - The patient snored with Loud snoring volume. - EKG findings include PVCs. - Moderate periodic limb movements of sleep occurred during the study. No significant associated arousals. DIAGNOSIS - Obstructive Sleep Apnea (327.23 [G47.33 ICD-10]) RECOMMENDATIONS - Therapeutic CPAP titration to determine optimal pressure required to alleviate sleep disordered breathing. - Positional therapy avoiding supine position during sleep. - Avoid alcohol, sedatives and other CNS depressants that may worsen sleep apnea and disrupt normal sleep architecture. - Sleep hygiene should be reviewed to assess factors that may improve sleep quality. - Weight management and regular exercise should be initiated or continued if appropriate.

## 2016-03-02 ENCOUNTER — Telehealth: Payer: Self-pay

## 2016-03-02 NOTE — Telephone Encounter (Signed)
-----   Message from Lupita Leash, MD sent at 02/27/2016  4:48 PM EDT ----- A, Her PSG showed sleep apnea and Dr. Vassie Loll recommends CPAP.  Please Rx Resmed 10 5-20cm H20 autotitration with 3 week download. Thanks B ----- Message ----- From: Oretha Milch, MD Sent: 02/26/2016   8:45 AM To: Lupita Leash, MD

## 2016-03-02 NOTE — Telephone Encounter (Signed)
lmtcb X1 for pt to relay sleep study results.  Will order sleep study after speaking to patient.

## 2016-03-03 NOTE — Telephone Encounter (Signed)
Called spoke with pt and is aware of below/ she wants to think about being set up CPAP. She will call us back once a decision is made. FYI to Dr. Kendrick Fries.

## 2016-03-03 NOTE — Telephone Encounter (Signed)
Pt returning call.Samantha Turner ° °

## 2016-03-23 ENCOUNTER — Other Ambulatory Visit: Payer: Self-pay

## 2016-03-23 ENCOUNTER — Telehealth: Payer: Self-pay | Admitting: Physician Assistant

## 2016-03-23 ENCOUNTER — Ambulatory Visit (INDEPENDENT_AMBULATORY_CARE_PROVIDER_SITE_OTHER): Payer: 59

## 2016-03-23 DIAGNOSIS — I428 Other cardiomyopathies: Secondary | ICD-10-CM | POA: Diagnosis not present

## 2016-03-23 LAB — ECHOCARDIOGRAM COMPLETE
CHL CUP DOP CALC LVOT VTI: 19 cm
CHL CUP MV DEC (S): 140
CHL CUP RV SYS PRESS: 15 mmHg
CHL CUP TV REG PEAK VELOCITY: 176 cm/s
E decel time: 140 msec
EERAT: 13.44
FS: 31 % (ref 28–44)
IVS/LV PW RATIO, ED: 1.03
LA vol A4C: 56.9 ml
LADIAMINDEX: 1.73 cm/m2
LASIZE: 39 mm
LAVOL: 67.3 mL
LAVOLIN: 29.8 mL/m2
LEFT ATRIUM END SYS DIAM: 39 mm
LV SIMPSON'S DISK: 55
LV TDI E'MEDIAL: 5.9
LV dias vol index: 55 mL/m2
LV dias vol: 125 mL — AB (ref 46–106)
LV sys vol: 56 mL — AB (ref 14–42)
LVEEAVG: 13.44
LVEEMED: 13.44
LVELAT: 8.41 cm/s
LVOT area: 3.46 cm2
LVOT peak vel: 85.7 cm/s
LVOTD: 21 mm
LVOTSV: 66 mL
LVSYSVOLIN: 25 mL/m2
MV pk A vel: 88.3 m/s
MVPG: 5 mmHg
MVPKEVEL: 113 m/s
PW: 9.02 mm — AB (ref 0.6–1.1)
Stroke v: 69 ml
TAPSE: 18.6 mm
TDI e' lateral: 8.41
TRMAXVEL: 176 cm/s

## 2016-03-23 NOTE — Telephone Encounter (Signed)
Pt returning call regarding Echo results-pls call

## 2016-03-23 NOTE — Telephone Encounter (Signed)
Returned pts call and discussed her echo results.  She verbalized understanding.

## 2016-04-04 ENCOUNTER — Ambulatory Visit (INDEPENDENT_AMBULATORY_CARE_PROVIDER_SITE_OTHER): Payer: 59 | Admitting: Pulmonary Disease

## 2016-04-04 ENCOUNTER — Encounter: Payer: Self-pay | Admitting: Pulmonary Disease

## 2016-04-04 DIAGNOSIS — G4733 Obstructive sleep apnea (adult) (pediatric): Secondary | ICD-10-CM

## 2016-04-04 NOTE — Progress Notes (Signed)
Subjective:    Patient ID: Samantha Turner, female    DOB: Feb 15, 1961, 55 y.o.   MRN: [id number]  Synopsis: First seen by [facility] in the summer of 2017 when she was hospitalized for A. fib with RVR leading to nonischemic cardiomyopathy. At that time her evaluation demonstrated a mediastinal mass. On further discussion with radiology was felt that this represented a bronchogenic cyst. They recommended an MRI of the chest. She also has symptoms consistent with obstructive sleep apnea and in August 2017 a polysomnogram was ordered. 02/2016 PSG> AHI 17, O2 saturation 87%  HPI Chief Complaint  Patient presents with  . Follow-up    review sleep study.  Pt has no breathing complaints today.    Samantha Turner didn't pick up the CPAP machine because she says didn't think she would use.  She still has fatigue in the daytime.  She notices that she only sleeps for a few hours before she has to wake up.  She just doesn't think that she would use the machine.  She has recently had a lot of trouble with her teeth.  Her depression has been worse recently.  Past Medical History:  Diagnosis Date  . Anxiety   . Atrial flutter (HCC) 12/16/2015  . Depression   . Headache   . Sinus tachycardia       Review of Systems     Objective:   Physical Exam Vitals:   04/04/16 1454  BP: 136/76  Pulse: 70  SpO2: 100%  Weight: 243 lb (110.2 kg)  Height: 5\' 3"  (1.6 m)   RA  Gen: well appearing HENT: OP clear, TM's clear, neck supple PULM: CTA B, normal percussion CV: RRR, no mgr, trace edema GI: BS+, soft, nontender Derm: no cyanosis or rash Psyche: normal mood and affect     August 2017 MRI chest shows mildly dilated left ventricle with mild concentric hypertrophy LVEF 47% with diffuse hypokinesis, normal right ventricular size and function, mild biatrial dilation, mildly dilated pulmonary artery, most consistent with nonischemic dilated cardiomyopathy which had improved compared to the  echocardiogram in July 2017, there was an over read by Dr. Llana Aliment who said that the 2.8 x 2.3 x 4.8 cm fluid signal intensity structure in the middle mediastinum appear to be benign. He felt it likely represented a bronchogenic cyst versus other benign causes.  Sleep study from September 2017 reviewed showing an AHI of 17, O2 saturation nadir 87%    Assessment & Plan:  OSA (obstructive sleep apnea) She was found to have obstructive sleep apnea on her polysomnogram last month. She chose not to have the CPAP machine since her house because she didn't think she would use it. However, she has significant symptoms of sleep apnea including daytime somnolence, depression and she was admitted to the hospital for A. fib which I explained to her as to likely related to untreated sleep apnea.  Today we talked about the risks of driving while sleepy, we reviewed sleep hygiene in detail.  She is interested in investigating whether or not an oral appliance may be used to treat her sleep apnea. She would also like to try to lose some weight.  Plan summary: Discuss possibility of oral appliance for sleep apnea with [facility] Exercise regularly, lose weight No driving while sleepy Sleep hygiene materials supplied today Follow-up in 4 months, consider CPAP if she is unable to treat her sleep apnea with any of these other measures  Greater than 50% of today's 27 minute  visit were spent face-to-face    Current Outpatient Prescriptions:  .  amiodarone (PACERONE) 200 MG tablet, Take 1 tablet (200 mg total) by mouth daily., Disp: 90 tablet, Rfl: 3 .  atorvastatin (LIPITOR) 40 MG tablet, Take 1 tablet (40 mg total) by mouth daily at 6 PM., Disp: 90 tablet, Rfl: 3 .  carvedilol (COREG) 12.5 MG tablet, Take 1 tablet (12.5 mg total) by mouth 2 (two) times daily., Disp: 180 tablet, Rfl: 3 .  furosemide (LASIX) 20 MG tablet, Take 1 tablet (20 mg total) by mouth daily., Disp: 90 tablet, Rfl: 3 .   lamoTRIgine (LAMICTAL) 100 MG tablet, Take 150 mg by mouth daily. , Disp: , Rfl:  .  lisinopril (PRINIVIL,ZESTRIL) 5 MG tablet, Take 1 tablet (5 mg total) by mouth daily., Disp: 90 tablet, Rfl: 3 .  naproxen sodium (ALEVE) 220 MG tablet, Take 440 mg by mouth daily as needed (pain)., Disp: , Rfl:  .  rivaroxaban (XARELTO) 20 MG TABS tablet, Take 1 tablet (20 mg total) by mouth daily with supper., Disp: 90 tablet, Rfl: 3 .  venlafaxine (EFFEXOR) 75 MG tablet, Take 75 mg by mouth 2 (two) times daily., Disp: , Rfl:

## 2016-04-04 NOTE — Assessment & Plan Note (Signed)
She was found to have obstructive sleep apnea on her polysomnogram last month. She chose not to have the CPAP machine since her house because she didn't think she would use it. However, she has significant symptoms of sleep apnea including daytime somnolence, depression and she was admitted to the hospital for A. fib which I explained to her as to likely related to untreated sleep apnea.  Today we talked about the risks of driving while sleepy, we reviewed sleep hygiene in detail.  She is interested in investigating whether or not an oral appliance may be used to treat her sleep apnea. She would also like to try to lose some weight.  Plan summary: Discuss possibility of oral appliance for sleep apnea with [facility] Exercise regularly, lose weight No driving while sleepy Sleep hygiene materials supplied today Follow-up in 4 months, consider CPAP if she is unable to treat her sleep apnea with any of these other measures  Greater than 50% of today's 27 minute visit were spent face-to-face

## 2016-04-04 NOTE — Patient Instructions (Addendum)
Exercise regularly, specifically during the daytime outside Try to lose weight Talk to Dr. Sharman Crate at [facility] about having an oral appliance made to treat your sleep apnea [email or web address]  We will see you back in about 4 months, but if you decide that she would like to use a CPAP machine between now and then please call me and me know.

## 2016-04-08 ENCOUNTER — Encounter: Payer: Self-pay | Admitting: Cardiology

## 2016-04-08 NOTE — Progress Notes (Signed)
Cardiology Office Note  Date: 04/11/2016   ID: Samantha Turner, DOB 10/28/1960, MRN [id number]  PCP: Kirstie PeriSHAH,ASHISH, MD  Evaluating Cardiologist: Nona DellSamuel Alizeh Madril, MD   Chief Complaint  Patient presents with  . Cardiomyopathy  . Paroxysmal atrial flutter    History of Present Illness: Samantha Turner is a 55 y.o. female last seen in the office by Ms. Geralynn Rilehompson PA-C in July, a former patient of Dr. Duke Salviaandolph now establishing follow-up with me. This is our first meeting - I reviewed substantial records and updated the chart. She has a complex medical history as outlined below.  She is here today with her husband. She does not report any chest pain or palpitations, states that she has been chronically fatigued. She has NYHA class 2-3 dyspnea depending on level of activity. She has a history of depression, also mild obstructive sleep apnea. We went over her medications in detail. She reports compliance. Does describe a feeling of mild orthostatic dizziness, sometimes presyncope.  Recent follow-up echocardiogram in October shows normalization of LVEF as detailed below. We went over the results today.  She had baseline TSH and LFTs back in July. I reviewed her prior tracings, it looks like she had typical atrial flutter, no definite atrial fibrillation.  Past Medical History:  Diagnosis Date  . Anxiety   . Bronchogenic cyst   . Depression   . Essential hypertension   . Headache   . History of cardiac catheterization    Normal coronary arteries July 2017  . Nonischemic cardiomyopathy (HCC)   . Obstructive sleep apnea   . Paroxysmal atrial flutter [facility](HCC)     Past Surgical History:  Procedure Laterality Date  . ABDOMINAL HYSTERECTOMY    . CARDIAC CATHETERIZATION N/A 12/16/2015   Procedure: Left Heart Cath and Coronary Angiography;  Surgeon: Iran OuchMuhammad A Arida, MD;  [facility];  Service: Cardiovascular;  Laterality: N/A;  . CARDIOVERSION N/A 12/18/2015   Procedure:  CARDIOVERSION;  Surgeon: Lars MassonKatarina H Nelson, MD;  [facility];  Service: Cardiovascular;  Laterality: N/A;  . CHOLECYSTECTOMY    . TEE WITHOUT CARDIOVERSION N/A 12/18/2015   Procedure: TRANSESOPHAGEAL ECHOCARDIOGRAM (TEE);  Surgeon: Lars MassonKatarina H Nelson, MD;  [facility];  Service: Cardiovascular;  Laterality: N/A;  . TUMOR REMOVAL  07/02/2015   ovarian    Current Outpatient Prescriptions  Medication Sig Dispense Refill  . amiodarone (PACERONE) 200 MG tablet Take 1 tablet (200 mg total) by mouth daily. 90 tablet 3  . atorvastatin (LIPITOR) 40 MG tablet Take 1 tablet (40 mg total) by mouth daily at 6 PM. 90 tablet 3  . lamoTRIgine (LAMICTAL) 100 MG tablet Take 150 mg by mouth daily.     Marland Kitchen. lisinopril (PRINIVIL,ZESTRIL) 5 MG tablet Take 1 tablet (5 mg total) by mouth daily. 90 tablet 3  . naproxen sodium (ALEVE) 220 MG tablet Take 440 mg by mouth daily as needed (pain).    . rivaroxaban (XARELTO) 20 MG TABS tablet Take 1 tablet (20 mg total) by mouth daily with supper. 90 tablet 3  . venlafaxine (EFFEXOR) 75 MG tablet Take 75 mg by mouth 2 (two) times daily.    . carvedilol (COREG) 6.25 MG tablet Take 1 tablet (6.25 mg total) by mouth 2 (two) times daily with a meal. 60 tablet 3   No current facility-administered medications for this visit.    Allergies:  Patient has no known allergies.   Social History: The patient  reports that she has never smoked. She has never  used smokeless tobacco. She reports that she drinks alcohol. She reports that she does not use drugs.   Family History: The patient's family history includes Heart failure in her mother.   ROS:  Please see the history of present illness. Otherwise, complete review of systems is positive for fatigue, symptoms of depression - no suicidal ideation.  All other systems are reviewed and negative.   Physical Exam: VS:  BP 122/84   Pulse 65   Ht 5\' 4"  (1.626 m)   Wt 242 lb (109.8 kg)   SpO2 98%   BMI 41.54 kg/m ,  BMI Body mass index is 41.54 kg/m.  Wt Readings from Last 3 Encounters:  04/11/16 242 lb (109.8 kg)  04/04/16 243 lb (110.2 kg)  02/21/16 240 lb (108.9 kg)    General: Obese woman, appears comfortable at rest. HEENT: Conjunctiva and lids normal, oropharynx clear. Neck: Supple, no elevated JVP or carotid bruits, no thyromegaly. Lungs: Decreased breath sounds but clear to auscultation, nonlabored breathing at rest. Cardiac: Regular rate and rhythm, no S3 or significant systolic murmur, no pericardial rub. Abdomen: Soft, nontender, bowel sounds present, no guarding or rebound. Extremities: No pitting edema, distal pulses 2+. Skin: Warm and dry. Musculoskeletal: No kyphosis. Neuropsychiatric: Alert and oriented x3, affect grossly appropriate.  ECG: I personally reviewed the tracing from 12/30/2015 which showed normal sinus rhythm with borderline low voltage.  Recent Labwork: 12/14/2015: Magnesium 2.3 12/15/2015: TSH 1.170 12/16/2015: ALT 13; AST 14 12/19/2015: BUN 14; Creatinine, Ser 0.96; Hemoglobin 11.7; Platelets 309; Potassium 4.2; Sodium 137     Component Value Date/Time   CHOL 214 (H) 12/16/2015 0322   TRIG 399 (H) 12/16/2015 0322   HDL 25 (L) 12/16/2015 0322   CHOLHDL 8.6 12/16/2015 0322   VLDL 80 (H) 12/16/2015 0322   LDLCALC 109 (H) 12/16/2015 0322    Other Studies Reviewed Today:  Cardiac MRI 02/01/2016: IMPRESSION: 1. 2.8 x 2.3 x 4.8 cm fluid signal intensity structure in the middle mediastinum has a benign appearance. Differential considerations include an exophytic pericardial cyst, enlarged superior pericardial recess, or other benign lesions such as a bronchogenic cyst.  IMPRESSION: 1. Mildly dilated left ventricle with mild concentric hypertrophy and mildly impaired systolic function (LVEF = 47%). There is diffuse hypokinesis.  No late gadolinium enhancement was seen.  2. Normal right ventricular size, thickness and systolic function (RVEF = 49%).  3.   Mild biatrial dilatation.  4.  Mild mitral and tricuspid regurgitation.  5.  Mildly dilated pulmonary artery  Collectively, these findings represent non-ischemic dilated cardiomyopathy, most probably tachycardia induced. When compared to the prior echocardiogram from 12/15/2015 LVEF has improved from 20% to 47% and LV size is now smaller  Echocardiogram 03/23/2016: Study Conclusions  - Left ventricle: The cavity size was normal. Wall thickness was   normal. The estimated ejection fraction was 55%. Wall motion was   normal; there were no regional wall motion abnormalities. Left   ventricular diastolic function parameters were normal. - Aortic valve: Mildly calcified annulus. Trileaflet. - Mitral valve: There was trivial regurgitation. - Left atrium: The atrium was at the upper limits of normal in   size. - Right atrium: Central venous pressure (est): 3 mm Hg. - Tricuspid valve: There was trivial regurgitation. - Pulmonary arteries: Systolic pressure could not be accurately   estimated. - Pericardium, extracardiac: There was no pericardial effusion.  Impressions:  - Normal LV wall thickness with LVEF approximately 55%. There has   been significant improvement in LVEF compared  to the study from   July. Grossly normal diastolic function. Upper normal left atrial   chamber size. Trivial mitral and tricuspid regurgitation.  Assessment and Plan:  1. History of typical atrial flutter with tachycardia-mediated cardiomyopathy status post TEE guided cardioversion and subsequently managed with amiodarone and Xarelto. CHADSVASC score is 3. Plan to get input from Dr. Johney Frame regarding whether an ablation would be considered in this case to more effectively manage her rhythm long-term and perhaps provide a way to come off of some of these medications long-term, particular amiodarone.  2. Nonischemic, tachycardia-mediated cardiomyopathy with subsequent normalization of LVEF by follow-up  echocardiogram in October. She continues on Coreg, reducing dose to 6.25 mg twice daily, continue ACE inhibitor, stop Lasix for now.  3. Normal coronary arteries by cardiac catheterization in July.  4. Obesity and mild obstructive sleep apnea. She follows with Pulmonary. Currently not on CPAP, also considering other treatment options.  5. Bronchogenic cyst.  6. Essential hypertension, blood pressure is well controlled today.  Current medicines were reviewed with the patient today.   Orders Placed This Encounter  Procedures  . Hepatic function panel  . TSH    Disposition: Follow-up in 3 months.  Signed, Jonelle Sidle, MD, [facility] 04/11/2016 10:19 AM    [facility] at [facility] [address] [place], [place], [place] [address] Phone: [phone]; Fax: [phone]

## 2016-04-11 ENCOUNTER — Encounter: Payer: Self-pay | Admitting: Cardiology

## 2016-04-11 ENCOUNTER — Ambulatory Visit (INDEPENDENT_AMBULATORY_CARE_PROVIDER_SITE_OTHER): Payer: 59 | Admitting: Cardiology

## 2016-04-11 VITALS — BP 122/84 | HR 65 | Ht 64.0 in | Wt 242.0 lb

## 2016-04-11 DIAGNOSIS — Z0389 Encounter for observation for other suspected diseases and conditions ruled out: Secondary | ICD-10-CM | POA: Diagnosis not present

## 2016-04-11 DIAGNOSIS — IMO0001 Reserved for inherently not codable concepts without codable children: Secondary | ICD-10-CM

## 2016-04-11 DIAGNOSIS — I428 Other cardiomyopathies: Secondary | ICD-10-CM

## 2016-04-11 DIAGNOSIS — G4733 Obstructive sleep apnea (adult) (pediatric): Secondary | ICD-10-CM | POA: Diagnosis not present

## 2016-04-11 DIAGNOSIS — I10 Essential (primary) hypertension: Secondary | ICD-10-CM

## 2016-04-11 DIAGNOSIS — J984 Other disorders of lung: Secondary | ICD-10-CM

## 2016-04-11 DIAGNOSIS — I4892 Unspecified atrial flutter: Secondary | ICD-10-CM | POA: Diagnosis not present

## 2016-04-11 MED ORDER — CARVEDILOL 6.25 MG PO TABS: 6.2500 mg | ORAL_TABLET | Freq: Two times a day (BID) | ORAL | 3 refills | Status: DC

## 2016-04-11 NOTE — Patient Instructions (Signed)
Your physician recommends that you schedule a follow-up appointment in: 3 MONTHS WITH DR. [facility]  Your physician has recommended you make the following change in your medication:   DECREASE COREG 6.25 TWICE DAILY  STOP LASIX   Your physician recommends that you return for lab work in 3 MONTHS JUST PRIOR TO YOUR NEXT VISIT   Thank you for choosing  [facility]Care!!

## 2016-04-14 ENCOUNTER — Other Ambulatory Visit: Payer: Self-pay | Admitting: *Deleted

## 2016-04-14 DIAGNOSIS — I483 Typical atrial flutter: Secondary | ICD-10-CM

## 2016-04-14 NOTE — Progress Notes (Signed)
Referral placed and will speak to schedulers for appt

## 2016-06-01 ENCOUNTER — Ambulatory Visit: Payer: 59 | Admitting: Internal Medicine

## 2016-06-22 ENCOUNTER — Ambulatory Visit: Payer: 59 | Admitting: Internal Medicine

## 2016-07-01 ENCOUNTER — Ambulatory Visit (INDEPENDENT_AMBULATORY_CARE_PROVIDER_SITE_OTHER): Payer: 59 | Admitting: Internal Medicine

## 2016-07-01 ENCOUNTER — Encounter (INDEPENDENT_AMBULATORY_CARE_PROVIDER_SITE_OTHER): Payer: Self-pay

## 2016-07-01 VITALS — BP 126/88 | HR 67 | Ht 62.0 in | Wt 254.0 lb

## 2016-07-01 DIAGNOSIS — G4733 Obstructive sleep apnea (adult) (pediatric): Secondary | ICD-10-CM | POA: Diagnosis not present

## 2016-07-01 DIAGNOSIS — IMO0001 Reserved for inherently not codable concepts without codable children: Secondary | ICD-10-CM

## 2016-07-01 DIAGNOSIS — I10 Essential (primary) hypertension: Secondary | ICD-10-CM

## 2016-07-01 DIAGNOSIS — I428 Other cardiomyopathies: Secondary | ICD-10-CM

## 2016-07-01 DIAGNOSIS — I4892 Unspecified atrial flutter: Secondary | ICD-10-CM | POA: Diagnosis not present

## 2016-07-01 DIAGNOSIS — Z0389 Encounter for observation for other suspected diseases and conditions ruled out: Secondary | ICD-10-CM | POA: Diagnosis not present

## 2016-07-01 NOTE — Patient Instructions (Addendum)
Medication Instructions:  Your physician recommends that you continue on your current medications as directed. Please refer to the Current Medication list given to you today.   Labwork: Your physician recommends that you return for lab work on 07/19/16---you do not have to fast    Testing/Procedures: Your physician has recommended that you have an ablation. Catheter ablation is a medical procedure used to treat some cardiac arrhythmias (irregular heartbeats). During catheter ablation, a long, thin, flexible tube is put into a blood vessel in your groin (upper thigh), or neck. This tube is called an ablation catheter. It is then guided to your heart through the blood vessel. Radio frequency waves destroy small areas of heart tissue where abnormal heartbeats may cause an arrhythmia to start. Please see the instruction sheet given to you today---07/26/16  Please arrive at [facility] Entrance of [facility] at 5:30am Do not eat or drink after midnight the night prior to the procedure Do not take any medications the day of the procedure Plan for one night stay at the hospital Will need someone to drive you home after discharge  Follow-Up:  Your physician recommends that you schedule a follow-up appointment in: 4 weeks from 07/26/16 with Dr Johney Frame   Any Other Special Instructions Will Be Listed Below (If Applicable).     If you need a refill on your cardiac medications before your next appointment, please call your pharmacy.

## 2016-07-04 NOTE — Progress Notes (Signed)
Electrophysiology Office Note   Date:  07/04/2016   ID:  Samantha Turner, DOB 1961-03-14, MRN [id number]  PCP:  Kirstie Peri, MD  Cardiologist:  Dr Purvis Sheffield Primary Electrophysiologist: Hillis Range, MD    CC: atrial arrhythmias   History of Present Illness: Samantha Turner is a 56 y.o. female who presents today for electrophysiology evaluation.   She is referred for further evaluation of atrial arrhythmias.  She has chronic systolic dysfunction with NYHA Class II/III CHF in the setting of atrial flutter.  She also has mild OSA.  She has had prior atrial flutter but not atrial fibrillation.  She has been treated with amiodarone.  She is also anticoagulated with xarelto.  Today, she denies symptoms of palpitations, chest pain, shortness of breath, orthopnea, PND, lower extremity edema, claudication, dizziness, presyncope, syncope, bleeding, or neurologic sequela. The patient is tolerating medications without difficulties and is otherwise without complaint today.    Past Medical History:  Diagnosis Date  . Anxiety   . Bronchogenic cyst   . Depression   . Essential hypertension   . Headache   . History of cardiac catheterization    Normal coronary arteries July 2017  . Nonischemic cardiomyopathy (HCC)   . Obstructive sleep apnea   . Paroxysmal atrial flutter [facility])    Past Surgical History:  Procedure Laterality Date  . ABDOMINAL HYSTERECTOMY    . CARDIAC CATHETERIZATION N/A 12/16/2015   Procedure: Left Heart Cath and Coronary Angiography;  Surgeon: Iran Ouch, MD;  Location: [facility] INVASIVE CV LAB;  Service: Cardiovascular;  Laterality: N/A;  . CARDIOVERSION N/A 12/18/2015   Procedure: CARDIOVERSION;  Surgeon: Lars Masson, MD;  Location: [facility] ENDOSCOPY;  Service: Cardiovascular;  Laterality: N/A;  . CHOLECYSTECTOMY    . TEE WITHOUT CARDIOVERSION N/A 12/18/2015   Procedure: TRANSESOPHAGEAL ECHOCARDIOGRAM (TEE);  Surgeon: Lars Masson, MD;  Location: [facility] ENDOSCOPY;  Service:  Cardiovascular;  Laterality: N/A;  . TUMOR REMOVAL  07/02/2015   ovarian     Current Outpatient Prescriptions  Medication Sig Dispense Refill  . amiodarone (PACERONE) 200 MG tablet Take 1 tablet (200 mg total) by mouth daily. 90 tablet 3  . atorvastatin (LIPITOR) 40 MG tablet Take 1 tablet (40 mg total) by mouth daily at 6 PM. 90 tablet 3  . carvedilol (COREG) 6.25 MG tablet Take 1 tablet (6.25 mg total) by mouth 2 (two) times daily with a meal. 60 tablet 3  . lamoTRIgine (LAMICTAL) 100 MG tablet Take 150 mg by mouth daily.     Marland Kitchen levothyroxine (SYNTHROID, LEVOTHROID) 25 MCG tablet Take 25 mcg by mouth daily.    Marland Kitchen lisinopril (PRINIVIL,ZESTRIL) 5 MG tablet Take 1 tablet (5 mg total) by mouth daily. 90 tablet 3  . rivaroxaban (XARELTO) 20 MG TABS tablet Take 1 tablet (20 mg total) by mouth daily with supper. 90 tablet 3  . VALIUM 2 MG tablet Take 2 mg by mouth daily as needed.    . venlafaxine (EFFEXOR) 75 MG tablet Take 75 mg by mouth 2 (two) times daily.     No current facility-administered medications for this visit.     Allergies:   Patient has no known allergies.   Social History:  The patient  reports that she has never smoked. She has never used smokeless tobacco. She reports that she drinks alcohol. She reports that she does not use drugs.   Family History:  The patient's  family history includes Heart failure in her mother.    ROS:  Please see  the history of present illness.   All other systems are reviewed and negative.    PHYSICAL EXAM: VS:  BP 126/88   Pulse 67   Ht 5\' 2"  (1.575 m)   Wt 254 lb (115.2 kg)   BMI 46.46 kg/m  , BMI Body mass index is 46.46 kg/m. GEN: overweight, in no acute distress  HEENT: normal  Neck: no JVD, carotid bruits, or masses Cardiac: RRR; no murmurs, rubs, or gallops,no edema  Respiratory:  clear to auscultation bilaterally, normal work of breathing GI: soft, nontender, nondistended, + BS MS: no deformity or atrophy  Skin: warm and dry    Neuro:  Strength and sensation are intact Psych: euthymic mood, full affect  EKG:  EKG is ordered today. The ekg ordered today shows sinus rhythm   Recent Labs: 12/14/2015: Magnesium 2.3 12/15/2015: TSH 1.170 12/16/2015: ALT 13 12/19/2015: BUN 14; Creatinine, Ser 0.96; Hemoglobin 11.7; Platelets 309; Potassium 4.2; Sodium 137    Lipid Panel     Component Value Date/Time   CHOL 214 (H) 12/16/2015 0322   TRIG 399 (H) 12/16/2015 0322   HDL 25 (L) 12/16/2015 0322   CHOLHDL 8.6 12/16/2015 0322   VLDL 80 (H) 12/16/2015 0322   LDLCALC 109 (H) 12/16/2015 0322     Wt Readings from Last 3 Encounters:  07/01/16 254 lb (115.2 kg)  04/11/16 242 lb (109.8 kg)  04/04/16 243 lb (110.2 kg)      Other studies Reviewed: Additional studies/ records that were reviewed today include: Dr Junius Argyle notes, prior echo, prior ekgs  Review of the above records today demonstrates: as above   ASSESSMENT AND PLAN:  1.  Typical atrial flutter The patient has symptomatic atrial flutter.  I do not see h/o afib.  She was started on amiodarone previously for atrial flutter with RVR.  With rhythm control, her EF has recovered.  Dr Purvis Sheffield is referring the patient for consideration of ablation.  He and I share concerns about long term risks of amiodarone. Therapeutic strategies for atrial flutter including medicine and ablation were discussed in detail with the patient today. Risk, benefits, and alternatives to EP study and radiofrequency ablation were also discussed in detail today. These risks include but are not limited to stroke, bleeding, vascular damage, tamponade, perforation,  lungs, and other structures, pulmonary vein stenosis, worsening renal function, and death. The patient understands these risk and wishes to proceed.  We will therefore proceed with catheter ablation at the next available time.  We will plan to stop xarelto and amiodarone post ablation  2. OSA Importance of treatment to avoid  afib long term discussed with patient  3. Obesity I worry that she is at risk for afib long term.  Lifestyle modification encouraged.  4. nonischemic CM EF recovered Will do better in sinus long term  5. htn Stable No change required today  Current medicines are reviewed at length with the patient today.   The patient does not have concerns regarding her medicines.  The following changes were made today:  none  Labs/ tests ordered today include:  Orders Placed This Encounter  Procedures  . Basic metabolic panel  . CBC w/Diff  . EKG 12-Lead     Signed, Hillis Range, MD    [facility] [address] [place] [address] [phone] (office) [phone] (fax)
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
## 2016-07-07 ENCOUNTER — Encounter (INDEPENDENT_AMBULATORY_CARE_PROVIDER_SITE_OTHER): Payer: Self-pay

## 2016-07-07 ENCOUNTER — Ambulatory Visit (INDEPENDENT_AMBULATORY_CARE_PROVIDER_SITE_OTHER): Payer: 59 | Admitting: Psychiatry

## 2016-07-07 ENCOUNTER — Encounter (HOSPITAL_COMMUNITY): Payer: Self-pay | Admitting: Psychiatry

## 2016-07-07 VITALS — BP 130/70 | HR 68 | Ht 62.0 in | Wt 253.0 lb

## 2016-07-07 DIAGNOSIS — F331 Major depressive disorder, recurrent, moderate: Secondary | ICD-10-CM | POA: Diagnosis not present

## 2016-07-07 DIAGNOSIS — Z90710 Acquired absence of both cervix and uterus: Secondary | ICD-10-CM | POA: Diagnosis not present

## 2016-07-07 DIAGNOSIS — Z9049 Acquired absence of other specified parts of digestive tract: Secondary | ICD-10-CM

## 2016-07-07 DIAGNOSIS — R45851 Suicidal ideations: Secondary | ICD-10-CM

## 2016-07-07 DIAGNOSIS — Z9889 Other specified postprocedural states: Secondary | ICD-10-CM

## 2016-07-07 DIAGNOSIS — Z8249 Family history of ischemic heart disease and other diseases of the circulatory system: Secondary | ICD-10-CM

## 2016-07-07 DIAGNOSIS — Z79899 Other long term (current) drug therapy: Secondary | ICD-10-CM

## 2016-07-07 MED ORDER — VENLAFAXINE HCL ER 37.5 MG PO CP24: 37.5000 mg | ORAL_CAPSULE | Freq: Every day | ORAL | 0 refills | Status: DC

## 2016-07-07 MED ORDER — VENLAFAXINE HCL ER 150 MG PO CP24: 150.0000 mg | ORAL_CAPSULE | Freq: Every day | ORAL | 0 refills | Status: DC

## 2016-07-07 NOTE — Patient Instructions (Addendum)
1. Increase Effexor 187.5 mg (150 mg + 37.5 mg) daily 2. Decrease lamotrigine 100 mg daily 3. Return to clinic in one month 4. Contact emergency resources which includes 911, ED, suicide crisis line [phone]) if any worsening in suicidal ideation.  5. Contact for therapy: Dr. Daisy Blossom Schneidmiller  [phone] [address] [place], [place] [address]

## 2016-07-11 NOTE — Progress Notes (Signed)
Cardiology Office Note  Date: 07/13/2016   ID: Samantha Turner, DOB Apr 25, 1961, MRN [id number]  PCP: Monico Blitz, MD  Primary Cardiologist: Rozann Lesches, MD   Chief Complaint  Patient presents with  . History of atrial flutter  . Cardiomyopathy    History of Present Illness: Samantha Turner is a 56 y.o. female that I met in the office back in November 2017. I referred her at that time to Dr. Rayann Heman for discussion of atrial flutter ablation with a history of tachycardia-mediated cardiomyopathy and prior TEE cardioversion, treated subsequently with amiodarone and Xarelto. She is scheduled to under go the procedure in a few weeks. She does not endorse any recent palpitations or chest pain and states that she has been compliant with her medications.  I reviewed her medications. Current cardiac regimen includes Coreg, amiodarone, Lipitor, lisinopril, and Xarelto.  Echocardiogram from October 2017 showed LVEF 55% which had improved significantly compared to her original presentation with cardiomyopathy.  Past Medical History:  Diagnosis Date  . Anxiety   . Bronchogenic cyst   . Depression   . Essential hypertension   . Headache   . History of cardiac catheterization    Normal coronary arteries July 2017  . Nonischemic cardiomyopathy ([place])   . Obstructive sleep apnea   . Paroxysmal atrial flutter [facility])     Past Surgical History:  Procedure Laterality Date  . ABDOMINAL HYSTERECTOMY    . CARDIAC CATHETERIZATION N/A 12/16/2015   Procedure: Left Heart Cath and Coronary Angiography;  Surgeon: Wellington Hampshire, MD;  Location: [facility] CV LAB;  Service: Cardiovascular;  Laterality: N/A;  . CARDIOVERSION N/A 12/18/2015   Procedure: CARDIOVERSION;  Surgeon: Dorothy Spark, MD;  Location: [facility];  Service: Cardiovascular;  Laterality: N/A;  . CHOLECYSTECTOMY    . TEE WITHOUT CARDIOVERSION N/A 12/18/2015   Procedure: TRANSESOPHAGEAL ECHOCARDIOGRAM (TEE);  Surgeon: Dorothy Spark, MD;  Location: [facility] ENDOSCOPY;  Service: Cardiovascular;  Laterality: N/A;  . TUMOR REMOVAL  07/02/2015   ovarian    Current Outpatient Prescriptions  Medication Sig Dispense Refill  . amiodarone (PACERONE) 200 MG tablet Take 1 tablet (200 mg total) by mouth daily. 90 tablet 3  . atorvastatin (LIPITOR) 40 MG tablet Take 1 tablet (40 mg total) by mouth daily at 6 PM. 90 tablet 3  . carvedilol (COREG) 6.25 MG tablet Take 1 tablet (6.25 mg total) by mouth 2 (two) times daily with a meal. 60 tablet 3  . lamoTRIgine (LAMICTAL) 100 MG tablet Take 150 mg by mouth daily.     Marland Kitchen levothyroxine (SYNTHROID, LEVOTHROID) 25 MCG tablet Take 25 mcg by mouth daily.    Marland Kitchen lisinopril (PRINIVIL,ZESTRIL) 5 MG tablet Take 1 tablet (5 mg total) by mouth daily. 90 tablet 3  . rivaroxaban (XARELTO) 20 MG TABS tablet Take 1 tablet (20 mg total) by mouth daily with supper. 90 tablet 3  . VALIUM 2 MG tablet Take 2 mg by mouth daily as needed.    . venlafaxine XR (EFFEXOR-XR) 150 MG 24 hr capsule Take 1 capsule (150 mg total) by mouth daily with breakfast. 30 capsule 0  . venlafaxine XR (EFFEXOR-XR) 37.5 MG 24 hr capsule Take 1 capsule (37.5 mg total) by mouth daily with breakfast. 30 capsule 0   No current facility-administered medications for this visit.    Allergies:  Patient has no known allergies.   Social History: The patient  reports that she has never smoked. She has never used smokeless tobacco. She reports that  she drinks alcohol. She reports that she does not use drugs.   ROS:  Please see the history of present illness. Otherwise, complete review of systems is positive for none.  All other systems are reviewed and negative.   Physical Exam: VS:  BP (!) 144/92   Pulse 67   Ht 5' 2" (1.575 m)   Wt 257 lb (116.6 kg)   SpO2 95%   BMI 47.01 kg/m , BMI Body mass index is 47.01 kg/m.  Wt Readings from Last 3 Encounters:  07/13/16 257 lb (116.6 kg)  07/01/16 254 lb (115.2 kg)  04/11/16 242 lb (109.8  kg)    General: Obese woman, appears comfortable at rest. HEENT: Conjunctiva and lids normal, oropharynx clear. Neck: Supple, no elevated JVP or carotid bruits, no thyromegaly. Lungs: Decreased breath sounds but clear to auscultation, nonlabored breathing at rest. Cardiac: Regular rate and rhythm, no S3 or significant systolic murmur, no pericardial rub. Abdomen: Soft, nontender, bowel sounds present, no guarding or rebound. Extremities: No pitting edema, distal pulses 2+. Skin: Warm and dry. Musculoskeletal: No kyphosis. Neuropsychiatric: Alert and oriented x3, affect grossly appropriate.  ECG: I personally reviewed the tracing from 07/01/2016 which showed normal sinus rhythm with low voltage and nonspecific T-wave changes.  Recent Labwork: 12/14/2015: Magnesium 2.3 12/15/2015: TSH 1.170 12/16/2015: ALT 13; AST 14 12/19/2015: BUN 14; Creatinine, Ser 0.96; Hemoglobin 11.7; Platelets 309; Potassium 4.2; Sodium 137     Component Value Date/Time   CHOL 214 (H) 12/16/2015 0322   TRIG 399 (H) 12/16/2015 0322   HDL 25 (L) 12/16/2015 0322   CHOLHDL 8.6 12/16/2015 0322   VLDL 80 (H) 12/16/2015 0322   LDLCALC 109 (H) 12/16/2015 0322    Other Studies Reviewed Today:  Echocardiogram 03/23/2016: Study Conclusions  - Left ventricle: The cavity size was normal. Wall thickness was   normal. The estimated ejection fraction was 55%. Wall motion was   normal; there were no regional wall motion abnormalities. Left   ventricular diastolic function parameters were normal. - Aortic valve: Mildly calcified annulus. Trileaflet. - Mitral valve: There was trivial regurgitation. - Left atrium: The atrium was at the upper limits of normal in   size. - Right atrium: Central venous pressure (est): 3 mm Hg. - Tricuspid valve: There was trivial regurgitation. - Pulmonary arteries: Systolic pressure could not be accurately   estimated. - Pericardium, extracardiac: There was no pericardial  effusion.  Impressions:  - Normal LV wall thickness with LVEF approximately 55%. There has   been significant improvement in LVEF compared to the study from   July. Grossly normal diastolic function. Upper normal left atrial   chamber size. Trivial mitral and tricuspid regurgitation.  Assessment and Plan:  1. History of typical atrial flutter status post TEE guided cardioversion, continuing on amiodarone and Xarelto with CHADSVASC score of 3. She has been evaluated by Dr. Rayann Heman with plan to move forward with radiofrequency ablation in a few weeks. If successful this will allow [place] to simplify medications, stop amiodarone, and possibly even Xarelto since she has not had any atrial fibrillation documented. We will see how she does.  2. History of tachycardia-mediated cardiomyopathy with improvement in LVEF.  3. Obstructive sleep apnea, follows with Dr. Manuella Ghazi.  4. Essential hypertension, no changes made to current regimen.  Current medicines were reviewed with the patient today.  Disposition: Follow-up in 2 months.  Signed, Satira Sark, MD, [facility] 07/13/2016 10:09 AM    [place] [place] [facility]  [address], [place], [place] [address] Phone: [phone]; Fax: [phone]

## 2016-07-13 ENCOUNTER — Ambulatory Visit (INDEPENDENT_AMBULATORY_CARE_PROVIDER_SITE_OTHER): Payer: 59 | Admitting: Cardiology

## 2016-07-13 ENCOUNTER — Encounter: Payer: Self-pay | Admitting: Cardiology

## 2016-07-13 VITALS — BP 144/92 | HR 67 | Ht 62.0 in | Wt 257.0 lb

## 2016-07-13 DIAGNOSIS — I4892 Unspecified atrial flutter: Secondary | ICD-10-CM | POA: Diagnosis not present

## 2016-07-13 DIAGNOSIS — I428 Other cardiomyopathies: Secondary | ICD-10-CM

## 2016-07-13 DIAGNOSIS — I10 Essential (primary) hypertension: Secondary | ICD-10-CM

## 2016-07-13 DIAGNOSIS — G4733 Obstructive sleep apnea (adult) (pediatric): Secondary | ICD-10-CM

## 2016-07-13 NOTE — Patient Instructions (Signed)
Your physician recommends that you schedule a follow-up appointment in: 2 months with Dr. Diona Browner.  Your physician recommends that you continue on your current medications as directed. Please refer to the Current Medication list given to you today.   Thank you for choosing [facility]!!

## 2016-07-19 ENCOUNTER — Other Ambulatory Visit: Payer: 59 | Admitting: *Deleted

## 2016-07-19 DIAGNOSIS — I4892 Unspecified atrial flutter: Secondary | ICD-10-CM

## 2016-07-19 LAB — CBC WITH DIFFERENTIAL/PLATELET
BASOS ABS: 0 10*3/uL (ref 0.0–0.2)
BASOS: 0 %
EOS (ABSOLUTE): 0.3 10*3/uL (ref 0.0–0.4)
Eos: 3 %
Hematocrit: 35.9 % (ref 34.0–46.6)
Hemoglobin: 11.7 g/dL (ref 11.1–15.9)
IMMATURE GRANS (ABS): 0 10*3/uL (ref 0.0–0.1)
Immature Granulocytes: 0 %
LYMPHS ABS: 2.4 10*3/uL (ref 0.7–3.1)
Lymphs: 26 %
MCH: 25.7 pg — AB (ref 26.6–33.0)
MCHC: 32.6 g/dL (ref 31.5–35.7)
MCV: 79 fL (ref 79–97)
MONOS ABS: 0.5 10*3/uL (ref 0.1–0.9)
Monocytes: 5 %
NEUTROS ABS: 6.1 10*3/uL (ref 1.4–7.0)
Neutrophils: 66 %
PLATELETS: 306 10*3/uL (ref 150–379)
RBC: 4.56 x10E6/uL (ref 3.77–5.28)
RDW: 15.5 % — ABNORMAL HIGH (ref 12.3–15.4)
WBC: 9.3 10*3/uL (ref 3.4–10.8)

## 2016-07-19 LAB — BASIC METABOLIC PANEL
BUN / CREAT RATIO: 12 (ref 9–23)
BUN: 10 mg/dL (ref 6–24)
CHLORIDE: 100 mmol/L (ref 96–106)
CO2: 24 mmol/L (ref 18–29)
Calcium: 9.3 mg/dL (ref 8.7–10.2)
Creatinine, Ser: 0.84 mg/dL (ref 0.57–1.00)
GFR, EST AFRICAN AMERICAN: 89 mL/min/{1.73_m2} (ref >59)
GFR, EST NON AFRICAN AMERICAN: 77 mL/min/{1.73_m2} (ref >59)
Glucose: 85 mg/dL (ref 65–99)
POTASSIUM: 4.6 mmol/L (ref 3.5–5.2)
SODIUM: 142 mmol/L (ref 134–144)

## 2016-07-25 NOTE — Anesthesia Preprocedure Evaluation (Addendum)
Anesthesia Evaluation  Patient identified by MRN, date of birth, ID band Patient awake    Reviewed: Allergy & Precautions, NPO status , Patient's Chart, lab work & pertinent test results  History of Anesthesia Complications Negative for: history of anesthetic complications  Airway Mallampati: II  TM Distance: >3 FB Neck ROM: Limited    Dental  (+) Edentulous Lower, Missing, Dental Advisory Given   Pulmonary sleep apnea ,    Pulmonary exam normal        Cardiovascular hypertension, Normal cardiovascular exam     Neuro/Psych PSYCHIATRIC DISORDERS Anxiety Depression negative neurological ROS     GI/Hepatic negative GI ROS, Neg liver ROS,   Endo/Other  Morbid obesity  Renal/GU negative Renal ROS     Musculoskeletal   Abdominal   Peds  Hematology   Anesthesia Other Findings   Reproductive/Obstetrics                            Anesthesia Physical Anesthesia Plan  ASA: III  Anesthesia Plan: MAC   Post-op Pain Management:    Induction:   Airway Management Planned: Natural Airway and Simple Face Mask  Additional Equipment:   Intra-op Plan:   Post-operative Plan: Extubation in OR  Informed Consent: I have reviewed the patients History and Physical, chart, labs and discussed the procedure including the risks, benefits and alternatives for the proposed anesthesia with the patient or authorized representative who has indicated his/her understanding and acceptance.   Dental advisory given  Plan Discussed with: CRNA, Anesthesiologist and Surgeon  Anesthesia Plan Comments: (Will attempt MAC but due to Obesity and OSA, pt may require GA.)      Anesthesia Quick Evaluation

## 2016-07-26 ENCOUNTER — Ambulatory Visit (HOSPITAL_COMMUNITY)
Admission: RE | Admit: 2016-07-26 | Discharge: 2016-07-26 | Disposition: A | Discharge status: Home / Self Care | Payer: 59 | Source: Ambulatory Visit | Attending: Internal Medicine | Admitting: Internal Medicine

## 2016-07-26 ENCOUNTER — Ambulatory Visit (HOSPITAL_COMMUNITY): Payer: 59 | Admitting: Anesthesiology

## 2016-07-26 ENCOUNTER — Encounter (HOSPITAL_COMMUNITY)
Admission: RE | Disposition: A | Discharge status: Home / Self Care | Payer: Self-pay | Source: Ambulatory Visit | Attending: Internal Medicine

## 2016-07-26 ENCOUNTER — Encounter (HOSPITAL_COMMUNITY): Payer: Self-pay | Admitting: Certified Registered Nurse Anesthetist

## 2016-07-26 DIAGNOSIS — F419 Anxiety disorder, unspecified: Secondary | ICD-10-CM | POA: Diagnosis not present

## 2016-07-26 DIAGNOSIS — I483 Typical atrial flutter: Secondary | ICD-10-CM | POA: Insufficient documentation

## 2016-07-26 DIAGNOSIS — I428 Other cardiomyopathies: Secondary | ICD-10-CM | POA: Diagnosis not present

## 2016-07-26 DIAGNOSIS — Z7901 Long term (current) use of anticoagulants: Secondary | ICD-10-CM | POA: Diagnosis not present

## 2016-07-26 DIAGNOSIS — Z6841 Body Mass Index (BMI) 40.0 and over, adult: Secondary | ICD-10-CM | POA: Diagnosis not present

## 2016-07-26 DIAGNOSIS — Z8249 Family history of ischemic heart disease and other diseases of the circulatory system: Secondary | ICD-10-CM | POA: Diagnosis not present

## 2016-07-26 DIAGNOSIS — I5022 Chronic systolic (congestive) heart failure: Secondary | ICD-10-CM | POA: Diagnosis not present

## 2016-07-26 DIAGNOSIS — I471 Supraventricular tachycardia: Secondary | ICD-10-CM | POA: Diagnosis present

## 2016-07-26 DIAGNOSIS — G4733 Obstructive sleep apnea (adult) (pediatric): Secondary | ICD-10-CM | POA: Insufficient documentation

## 2016-07-26 DIAGNOSIS — I110 Hypertensive heart disease with heart failure: Secondary | ICD-10-CM | POA: Diagnosis not present

## 2016-07-26 DIAGNOSIS — F329 Major depressive disorder, single episode, unspecified: Secondary | ICD-10-CM | POA: Diagnosis not present

## 2016-07-26 DIAGNOSIS — I4892 Unspecified atrial flutter: Secondary | ICD-10-CM | POA: Diagnosis not present

## 2016-07-26 HISTORY — PX: A-FLUTTER ABLATION: EP1230

## 2016-07-26 SURGERY — A-FLUTTER ABLATION
Anesthesia: General

## 2016-07-26 MED ORDER — LISINOPRIL 5 MG PO TABS
5.0000 mg | ORAL_TABLET | Freq: Every day | ORAL | Status: DC
  Administered 2016-07-26: 5 mg via ORAL
  Filled 2016-07-26: qty 1

## 2016-07-26 MED ORDER — VENLAFAXINE HCL ER 150 MG PO CP24
187.5000 mg | ORAL_CAPSULE | Freq: Every day | ORAL | Status: DC
  Administered 2016-07-26: 187.5 mg via ORAL
  Filled 2016-07-26: qty 1

## 2016-07-26 MED ORDER — BUPIVACAINE HCL (PF) 0.25 % IJ SOLN
INTRAMUSCULAR | Status: AC
  Filled 2016-07-26: qty 30

## 2016-07-26 MED ORDER — PHENYLEPHRINE HCL 10 MG/ML IJ SOLN
INTRAMUSCULAR | Status: DC | PRN
  Administered 2016-07-26 (×4): 40 ug via INTRAVENOUS

## 2016-07-26 MED ORDER — FENTANYL CITRATE (PF) 100 MCG/2ML IJ SOLN
INTRAMUSCULAR | Status: DC | PRN
  Administered 2016-07-26 (×3): 25 ug via INTRAVENOUS
  Administered 2016-07-26: 50 ug via INTRAVENOUS

## 2016-07-26 MED ORDER — VENLAFAXINE HCL ER 37.5 MG PO CP24
37.5000 mg | ORAL_CAPSULE | Freq: Every day | ORAL | Status: DC
  Filled 2016-07-26: qty 1

## 2016-07-26 MED ORDER — SODIUM CHLORIDE 0.9 % IV SOLN
INTRAVENOUS | Status: DC
  Administered 2016-07-26: 06:00:00 via INTRAVENOUS

## 2016-07-26 MED ORDER — ONDANSETRON HCL 4 MG/2ML IJ SOLN
INTRAMUSCULAR | Status: DC | PRN
  Administered 2016-07-26: 4 mg via INTRAVENOUS

## 2016-07-26 MED ORDER — ALBUTEROL SULFATE (2.5 MG/3ML) 0.083% IN NEBU: 2.5000 mg | INHALATION_SOLUTION | Freq: Four times a day (QID) | RESPIRATORY_TRACT | Status: DC | PRN

## 2016-07-26 MED ORDER — PROPOFOL 10 MG/ML IV BOLUS
INTRAVENOUS | Status: DC | PRN
  Administered 2016-07-26: 20 mg via INTRAVENOUS
  Administered 2016-07-26 (×3): 30 mg via INTRAVENOUS
  Administered 2016-07-26: 20 mg via INTRAVENOUS
  Administered 2016-07-26: 70 mg via INTRAVENOUS

## 2016-07-26 MED ORDER — HYDROCODONE-ACETAMINOPHEN 5-325 MG PO TABS: 1.0000 | ORAL_TABLET | ORAL | Status: DC | PRN

## 2016-07-26 MED ORDER — SODIUM CHLORIDE 0.9% FLUSH: 3.0000 mL | Freq: Two times a day (BID) | INTRAVENOUS | Status: DC

## 2016-07-26 MED ORDER — SODIUM CHLORIDE 0.9% FLUSH: 3.0000 mL | INTRAVENOUS | Status: DC | PRN

## 2016-07-26 MED ORDER — ACETAMINOPHEN 325 MG PO TABS
650.0000 mg | ORAL_TABLET | ORAL | Status: DC | PRN
  Administered 2016-07-26: 650 mg via ORAL
  Filled 2016-07-26: qty 2

## 2016-07-26 MED ORDER — MIDAZOLAM HCL 5 MG/5ML IJ SOLN
INTRAMUSCULAR | Status: DC | PRN
  Administered 2016-07-26: 2 mg via INTRAVENOUS

## 2016-07-26 MED ORDER — PROMETHAZINE HCL 25 MG/ML IJ SOLN: 6.2500 mg | INTRAMUSCULAR | Status: DC | PRN

## 2016-07-26 MED ORDER — VENLAFAXINE HCL ER 75 MG PO CP24: 150.0000 mg | ORAL_CAPSULE | Freq: Every day | ORAL | Status: DC

## 2016-07-26 MED ORDER — SODIUM CHLORIDE 0.9 % IV SOLN: 250.0000 mL | INTRAVENOUS | Status: DC | PRN

## 2016-07-26 MED ORDER — BUPIVACAINE HCL (PF) 0.25 % IJ SOLN
INTRAMUSCULAR | Status: DC | PRN
  Administered 2016-07-26: 22 mL

## 2016-07-26 MED ORDER — HYDROMORPHONE HCL 1 MG/ML IJ SOLN: 0.2500 mg | INTRAMUSCULAR | Status: DC | PRN

## 2016-07-26 MED ORDER — ONDANSETRON HCL 4 MG/2ML IJ SOLN: 4.0000 mg | Freq: Four times a day (QID) | INTRAMUSCULAR | Status: DC | PRN

## 2016-07-26 MED ORDER — PROPOFOL 500 MG/50ML IV EMUL
INTRAVENOUS | Status: DC | PRN
Start: 2016-07-26 — End: 2016-07-26
  Administered 2016-07-26: 50 ug/kg/min via INTRAVENOUS
  Administered 2016-07-26: 08:00:00 via INTRAVENOUS

## 2016-07-26 MED ORDER — LAMOTRIGINE 25 MG PO TABS
150.0000 mg | ORAL_TABLET | Freq: Every day | ORAL | Status: DC
  Administered 2016-07-26: 150 mg via ORAL
  Filled 2016-07-26: qty 6

## 2016-07-26 MED ORDER — DIAZEPAM 2 MG PO TABS: 2.0000 mg | ORAL_TABLET | Freq: Every day | ORAL | Status: DC | PRN

## 2016-07-26 SURGICAL SUPPLY — 15 items
BAG SNAP BAND KOVER 36X36 (MISCELLANEOUS) ×3 IMPLANT
BLANKET WARM UNDERBOD FULL ACC (MISCELLANEOUS) ×3 IMPLANT
CATH EZ STEER NAV 8MM F-J CUR (ABLATOR) ×3 IMPLANT
CATH JOSEPH QUAD ALLRED 6F REP (CATHETERS) ×3 IMPLANT
CATH WEBSTER BI DIR CS D-F CRV (CATHETERS) ×3 IMPLANT
PACK EP LATEX FREE (CUSTOM PROCEDURE TRAY) ×2
PACK EP LF (CUSTOM PROCEDURE TRAY) ×1 IMPLANT
PAD DEFIB LIFELINK (PAD) ×3 IMPLANT
PATCH CARTO3 (PAD) ×3 IMPLANT
PINNACLE LONG 6F 25CM (SHEATH) ×3
SHEATH INTRO PINNACLE 6F 25CM (SHEATH) ×1 IMPLANT
SHEATH PINNACLE 6F 10CM (SHEATH) ×3 IMPLANT
SHEATH PINNACLE 7F 10CM (SHEATH) ×3 IMPLANT
SHEATH PINNACLE 8F 10CM (SHEATH) ×3 IMPLANT
WIRE EMERALD 3MM-J .035X150CM (WIRE) ×3 IMPLANT

## 2016-07-26 NOTE — Anesthesia Procedure Notes (Signed)
Procedure Name: LMA Insertion Date/Time: 07/26/2016 8:26 AM Performed by: Daiva Eves Pre-anesthesia Checklist: Patient identified, Emergency Drugs available, Suction available, Patient being monitored and Timeout performed Patient Re-evaluated:Patient Re-evaluated prior to inductionOxygen Delivery Method: Circle system utilized Preoxygenation: Pre-oxygenation with 100% oxygen Intubation Type: IV induction LMA: LMA inserted LMA Size: 5.0 Number of attempts: 1 Placement Confirmation: positive ETCO2 and breath sounds checked- equal and bilateral Tube secured with: Tape Dental Injury: Teeth and Oropharynx as per pre-operative assessment

## 2016-07-26 NOTE — Transfer of Care (Signed)
Immediate Anesthesia Transfer of Care Note  Patient: Samantha Turner  Procedure(s) Performed: Procedure(s): A-Flutter Ablation (N/A)  Patient Location: Cath Lab  Anesthesia Type:General  Level of Consciousness: awake, alert  and oriented  Airway & Oxygen Therapy: Patient Spontanous Breathing  Post-op Assessment: Report given to RN and Post -op Vital signs reviewed and stable  Post vital signs: Reviewed and stable  Last Vitals:  Vitals:   07/26/16 0927 07/26/16 0935  BP:    Pulse:    Resp:    Temp: 36.7 C 36.1 C    Last Pain:  Vitals:   07/26/16 0927  TempSrc: Oral      Patients Stated Pain Goal: 5 (07/26/16 0927)  Complications: No apparent anesthesia complications

## 2016-07-26 NOTE — Progress Notes (Signed)
Doing well s/p ablation VSS No concerns  DC to home Stop amiodarone and coreg Continue all other medicines  Follow-up with me in 4 weeks Will likely stop anticoagulation at that time  Lifestyle modification discussed at length  Hillis Range MD, [facility] 07/26/2016 3:58 PM

## 2016-07-26 NOTE — Discharge Instructions (Signed)
No driving for w week. No lifting over 5 lbs for 1 week. No vigorous or sexual activity for 1 week. You may return to work on 08/02/16. Keep procedure site clean & dry. If you notice increased pain, swelling, bleeding or pus, call/return!  You may shower, but no soaking baths/hot tubs/pools for 1 week.

## 2016-07-26 NOTE — Progress Notes (Signed)
Discharge instructions, RX's and follow up appts explained and provided to patient verbalized understanding. Patient left floor via wheelchair accompanied by staff no c/o pain or shortness of breath at d/c.  Myrissa Chipley Lynn, RN  

## 2016-07-26 NOTE — Progress Notes (Signed)
Site area: Right groin a 6,7,8 french venous sheaths were removed  Site Prior to Removal:  Level 0  Pressure Applied For 15 MINUTES    Bedrest Beginning at 0900am  Manual:   Yes.    Patient Status During Pull:  stable  Post Pull Groin Site:  Level 0  Post Pull Instructions Given:  Yes.    Post Pull Pulses Present:  Yes.    Dressing Applied:  Yes.    Comments:  VS remain stable during sheath pull.

## 2016-07-26 NOTE — Anesthesia Postprocedure Evaluation (Addendum)
Anesthesia Post Note  Patient: Samantha Turner  Procedure(s) Performed: Procedure(s) (LRB): A-Flutter Ablation (N/A)  Patient location during evaluation: PACU Anesthesia Type: General Level of consciousness: sedated Pain management: pain level controlled Vital Signs Assessment: post-procedure vital signs reviewed and stable Respiratory status: spontaneous breathing and respiratory function stable Cardiovascular status: stable Anesthetic complications: no       Last Vitals:  Vitals:   07/26/16 1003 07/26/16 1029  BP:  (!) 146/67  Pulse:  60  Resp:  18  Temp: 36.4 C 36.4 C    Last Pain:  Vitals:   07/26/16 1029  TempSrc: Oral                 Gadge Hermiz DANIEL

## 2016-07-26 NOTE — H&P (View-Only) (Signed)
Electrophysiology Office Note   Date:  07/04/2016   ID:  Samantha Turner, DOB [birth year], MRN [id number]  PCP:  Kirstie Peri, MD  Cardiologist:  Dr Purvis Sheffield Primary Electrophysiologist: Hillis Range, MD    CC: atrial arrhythmias   History of Present Illness: Samantha Turner is a 56 y.o. female who presents today for electrophysiology evaluation.   She is referred for further evaluation of atrial arrhythmias.  She has chronic systolic dysfunction with NYHA Class II/III CHF in the setting of atrial flutter.  She also has mild OSA.  She has had prior atrial flutter but not atrial fibrillation.  She has been treated with amiodarone.  She is also anticoagulated with xarelto.  Today, she denies symptoms of palpitations, chest pain, shortness of breath, orthopnea, PND, lower extremity edema, claudication, dizziness, presyncope, syncope, bleeding, or neurologic sequela. The patient is tolerating medications without difficulties and is otherwise without complaint today.    Past Medical History:  Diagnosis Date  . Anxiety   . Bronchogenic cyst   . Depression   . Essential hypertension   . Headache   . History of cardiac catheterization    Normal coronary arteries July 2017  . Nonischemic cardiomyopathy (HCC)   . Obstructive sleep apnea   . Paroxysmal atrial flutter [facility])    Past Surgical History:  Procedure Laterality Date  . ABDOMINAL HYSTERECTOMY    . CARDIAC CATHETERIZATION N/A 12/16/2015   Procedure: Left Heart Cath and Coronary Angiography;  Surgeon: Iran Ouch, MD;  Location: [facility] INVASIVE CV LAB;  Service: Cardiovascular;  Laterality: N/A;  . CARDIOVERSION N/A 12/18/2015   Procedure: CARDIOVERSION;  Surgeon: Lars Masson, MD;  Location: [facility] ENDOSCOPY;  Service: Cardiovascular;  Laterality: N/A;  . CHOLECYSTECTOMY    . TEE WITHOUT CARDIOVERSION N/A 12/18/2015   Procedure: TRANSESOPHAGEAL ECHOCARDIOGRAM (TEE);  Surgeon: Lars Masson, MD;  Location: [facility] ENDOSCOPY;  Service:  Cardiovascular;  Laterality: N/A;  . TUMOR REMOVAL  07/02/2015   ovarian     Current Outpatient Prescriptions  Medication Sig Dispense Refill  . amiodarone (PACERONE) 200 MG tablet Take 1 tablet (200 mg total) by mouth daily. 90 tablet 3  . atorvastatin (LIPITOR) 40 MG tablet Take 1 tablet (40 mg total) by mouth daily at 6 PM. 90 tablet 3  . carvedilol (COREG) 6.25 MG tablet Take 1 tablet (6.25 mg total) by mouth 2 (two) times daily with a meal. 60 tablet 3  . lamoTRIgine (LAMICTAL) 100 MG tablet Take 150 mg by mouth daily.     Marland Kitchen levothyroxine (SYNTHROID, LEVOTHROID) 25 MCG tablet Take 25 mcg by mouth daily.    Marland Kitchen lisinopril (PRINIVIL,ZESTRIL) 5 MG tablet Take 1 tablet (5 mg total) by mouth daily. 90 tablet 3  . rivaroxaban (XARELTO) 20 MG TABS tablet Take 1 tablet (20 mg total) by mouth daily with supper. 90 tablet 3  . VALIUM 2 MG tablet Take 2 mg by mouth daily as needed.    . venlafaxine (EFFEXOR) 75 MG tablet Take 75 mg by mouth 2 (two) times daily.     No current facility-administered medications for this visit.     Allergies:   Patient has no known allergies.   Social History:  The patient  reports that she has never smoked. She has never used smokeless tobacco. She reports that she drinks alcohol. She reports that she does not use drugs.   Family History:  The patient's  family history includes Heart failure in her mother.    ROS:  Please see  the history of present illness.   All other systems are reviewed and negative.    PHYSICAL EXAM: VS:  BP 126/88   Pulse 67   Ht 5\' 2"  (1.575 m)   Wt 254 lb (115.2 kg)   BMI 46.46 kg/m  , BMI Body mass index is 46.46 kg/m. GEN: overweight, in no acute distress  HEENT: normal  Neck: no JVD, carotid bruits, or masses Cardiac: RRR; no murmurs, rubs, or gallops,no edema  Respiratory:  clear to auscultation bilaterally, normal work of breathing GI: soft, nontender, nondistended, + BS MS: no deformity or atrophy  Skin: warm and dry    Neuro:  Strength and sensation are intact Psych: euthymic mood, full affect  EKG:  EKG is ordered today. The ekg ordered today shows sinus rhythm   Recent Labs: 12/14/2015: Magnesium 2.3 12/15/2015: TSH 1.170 12/16/2015: ALT 13 12/19/2015: BUN 14; Creatinine, Ser 0.96; Hemoglobin 11.7; Platelets 309; Potassium 4.2; Sodium 137    Lipid Panel     Component Value Date/Time   CHOL 214 (H) 12/16/2015 0322   TRIG 399 (H) 12/16/2015 0322   HDL 25 (L) 12/16/2015 0322   CHOLHDL 8.6 12/16/2015 0322   VLDL 80 (H) 12/16/2015 0322   LDLCALC 109 (H) 12/16/2015 0322     Wt Readings from Last 3 Encounters:  07/01/16 254 lb (115.2 kg)  04/11/16 242 lb (109.8 kg)  04/04/16 243 lb (110.2 kg)      Other studies Reviewed: Additional studies/ records that were reviewed today include: Dr Junius Argyle notes, prior echo, prior ekgs  Review of the above records today demonstrates: as above   ASSESSMENT AND PLAN:  1.  Typical atrial flutter The patient has symptomatic atrial flutter.  I do not see h/o afib.  She was started on amiodarone previously for atrial flutter with RVR.  With rhythm control, her EF has recovered.  Dr Purvis Sheffield is referring the patient for consideration of ablation.  He and I share concerns about long term risks of amiodarone. Therapeutic strategies for atrial flutter including medicine and ablation were discussed in detail with the patient today. Risk, benefits, and alternatives to EP study and radiofrequency ablation were also discussed in detail today. These risks include but are not limited to stroke, bleeding, vascular damage, tamponade, perforation,  lungs, and other structures, pulmonary vein stenosis, worsening renal function, and death. The patient understands these risk and wishes to proceed.  We will therefore proceed with catheter ablation at the next available time.  We will plan to stop xarelto and amiodarone post ablation  2. OSA Importance of treatment to avoid  afib long term discussed with patient  3. Obesity I worry that she is at risk for afib long term.  Lifestyle modification encouraged.  4. nonischemic CM EF recovered Will do better in sinus long term  5. htn Stable No change required today  Current medicines are reviewed at length with the patient today.   The patient does not have concerns regarding her medicines.  The following changes were made today:  none  Labs/ tests ordered today include:  Orders Placed This Encounter  Procedures  . Basic metabolic panel  . CBC w/Diff  . EKG 12-Lead     Signed, Hillis Range, MD    [facility] [address] [place] [address] [phone] (office) [phone] (fax)

## 2016-07-26 NOTE — Interval H&P Note (Signed)
History and Physical Interval Note:  07/26/2016 7:28 AM  Samantha Turner  has presented today for surgery, with the diagnosis of flutter  The various methods of treatment have been discussed with the patient and family. After consideration of risks, benefits and other options for treatment, the patient has consented to  Procedure(s): A-Flutter Ablation (N/A) as a surgical intervention .  The patient's history has been reviewed, patient examined, no change in status, stable for surgery.  I have reviewed the patient's chart and labs.  Questions were answered to the patient's satisfaction.  She reports compliance with xarelto without interruption.   Hillis Range

## 2016-07-27 ENCOUNTER — Encounter (HOSPITAL_COMMUNITY): Payer: Self-pay | Admitting: Internal Medicine

## 2016-08-02 NOTE — Progress Notes (Signed)
BH MD/PA/NP OP Progress Note  08/04/2016 10:25 AM Samantha Turner  MRN:  [id number]  Chief Complaint:  Chief Complaint    Depression; Follow-up     Subjective:  "I'm crying" HPI:  Patient presents for follow up appointment. She states that she has crying spells and feels that she is "not good enough." She talks about her granddaughter, age 58 who is resistant to go to school. She also feels scared and anxious about her medical condition, when she is explained by the physician about possible complication. She recently received a cardiac procedure and she feels good about it.   She has insomnia with night time awakening. She has good motivation, although sometimes she feels tired and not able to do cooking as expected. She reports passive SI. She occasionally has vague AH, which has improved since the last encounter. She takes Valium a couple of times per month for her anxiety. She denies any difference since decreasing lamotrigine (75mg .)   Visit Diagnosis:    ICD-9-CM ICD-10-CM   1. Major depressive disorder, recurrent episode, moderate (HCC) 296.32 F33.1     Past Psychiatric History:  Outpatient: denies Psychiatry admission: denies Previous suicide attempt: She had a gun and her husband prevented in 2016  Past trials of medication: sertraline, Prozac, Paxil, Lexapro, Effexor, Lamotrigine History of violence: denies  Past Medical History:  Past Medical History:  Diagnosis Date  . Anxiety   . Bronchogenic cyst   . Depression   . Essential hypertension   . Headache   . History of cardiac catheterization    Normal coronary arteries July 2017  . Nonischemic cardiomyopathy (HCC)   . Obstructive sleep apnea   . Paroxysmal atrial flutter [facility])     Past Surgical History:  Procedure Laterality Date  . A-FLUTTER ABLATION N/A 07/26/2016   Procedure: A-Flutter Ablation;  Surgeon: Hillis Range, MD;  Location: [facility] INVASIVE CV LAB;  Service: Cardiovascular;  Laterality: N/A;  . ABDOMINAL  HYSTERECTOMY    . CARDIAC CATHETERIZATION N/A 12/16/2015   Procedure: Left Heart Cath and Coronary Angiography;  Surgeon: Iran Ouch, MD;  Location: [facility] INVASIVE CV LAB;  Service: Cardiovascular;  Laterality: N/A;  . CARDIOVERSION N/A 12/18/2015   Procedure: CARDIOVERSION;  Surgeon: Lars Masson, MD;  Location: [facility] ENDOSCOPY;  Service: Cardiovascular;  Laterality: N/A;  . CHOLECYSTECTOMY    . TEE WITHOUT CARDIOVERSION N/A 12/18/2015   Procedure: TRANSESOPHAGEAL ECHOCARDIOGRAM (TEE);  Surgeon: Lars Masson, MD;  Location: [facility] ENDOSCOPY;  Service: Cardiovascular;  Laterality: N/A;  . TUMOR REMOVAL  07/02/2015   ovarian    Family Psychiatric History:  Brother- attempted suicide in January 2017  Family History:  Family History  Problem Relation Age of Onset  . Heart failure Mother     Social History:  Social History   Social History  . Marital status: Single    Spouse name: N/A  . Number of children: N/A  . Years of education: N/A   Social History Main Topics  . Smoking status: Never Smoker  . Smokeless tobacco: Never Used  . Alcohol use Yes     Comment: rarel  . Drug use: No  . Sexual activity: Yes    Birth control/ protection: Surgical   Other Topics Concern  . Not on file   Social History Narrative  . No narrative on file    Allergies: No Known Allergies  Metabolic Disorder Labs: Lab Results  Component Value Date   HGBA1C 5.7 (H) 12/15/2015   MPG 117 12/15/2015  No results found for: PROLACTIN Lab Results  Component Value Date   CHOL 214 (H) 12/16/2015   TRIG 399 (H) 12/16/2015   HDL 25 (L) 12/16/2015   CHOLHDL 8.6 12/16/2015   VLDL 80 (H) 12/16/2015   LDLCALC 109 (H) 12/16/2015     Current Medications: Current Outpatient Prescriptions  Medication Sig Dispense Refill  . atorvastatin (LIPITOR) 40 MG tablet Take 1 tablet (40 mg total) by mouth daily at 6 PM. (Patient taking differently: Take 40 mg by mouth at bedtime. ) 90 tablet 3  .  lamoTRIgine (LAMICTAL) 150 MG tablet Take 150 mg by mouth daily.    Marland Kitchen lisinopril (PRINIVIL,ZESTRIL) 5 MG tablet Take 1 tablet (5 mg total) by mouth daily. (Patient taking differently: Take 5 mg by mouth at bedtime. ) 90 tablet 3  . VALIUM 2 MG tablet Take 2 mg by mouth daily as needed for anxiety.     Marland Kitchen venlafaxine XR (EFFEXOR-XR) 150 MG 24 hr capsule Take 1 capsule (150 mg total) by mouth daily with breakfast. Take total of 225 mg daily 30 capsule 0  . VENTOLIN HFA 108 (90 Base) MCG/ACT inhaler Inhale 1-2 puffs into the lungs every 6 (six) hours as needed for shortness of breath or wheezing.  0  . amiodarone (PACERONE) 200 MG tablet Take 200 mg by mouth daily.    Marland Kitchen venlafaxine XR (EFFEXOR-XR) 75 MG 24 hr capsule Take 1 capsule (75 mg total) by mouth daily with breakfast. Take total of 225 mg daily 30 capsule 0  . XARELTO 20 MG TABS tablet Take 1 tablet by mouth daily.     No current facility-administered medications for this visit.     Neurologic: Headache: No Seizure: No Paresthesias: No  Musculoskeletal: Strength & Muscle Tone: within normal limits Gait & Station: normal Patient leans: N/A  Psychiatric Specialty Exam: Review of Systems  Psychiatric/Behavioral: Positive for depression, hallucinations and suicidal ideas. Negative for substance abuse. The patient is nervous/anxious and has insomnia.   All other systems reviewed and are negative.   Blood pressure 118/90, pulse 73, height 5\' 2"  (1.575 m), weight 254 lb (115.2 kg), SpO2 93 %.Body mass index is 46.46 kg/m.  General Appearance: Fairly Groomed  Eye Contact:  Good  Speech:  Clear and Coherent  Volume:  Normal  Mood:  Depressed  Affect:  down  Thought Process:  Coherent and Goal Directed  Orientation:  Full (Time, Place, and Person)  Thought Content: Logical  Perceptions: vague AH, denies VH  Suicidal Thoughts:  Yes.  without intent/plan  Homicidal Thoughts:  No  Memory:  Immediate;   Good Recent;   Good Remote;    Good  Judgement:  Good  Insight:  Fair  Psychomotor Activity:  Normal  Concentration:  Concentration: Good and Attention Span: Good  Recall:  Good  Fund of Knowledge: Good  Language: Good  Akathisia:  No  Handed:  Right  AIMS (if indicated):  N/A  Assets:  Communication Skills Desire for Improvement  ADL's:  Intact  Cognition: WNL  Sleep:  poor   Assessment Vianka Ertel is  56 year old female with depression, chronic systolic dysfunction with NYHA Class II/III CHF in the setting of atrial flutter, OSA who presents for depression. Psychosocial stressors including her brother who committed suicide in January 2017 and her medical condition.   # MDD There has been a slight improvement in her neurovegetative symptoms and AH since uptitrating Effexor. Will increase further to optimize its effect. Discussed risk of  prolonged QTc with concomitant use of amiodarone. Patient is advised to contact the clinic if she experiences worsening in her cardiac symptoms (as well as contacting her cardiologist). Will discontinue lamotrigine given she denies any manic episode in the past.  She will greatly benefit from CBT/supportive therapy to process loss and demoralization from her physical condition. Provided referral information.   Plan 1. Increase Effexor 225 mg (150 mg + 75 mg) daily 2.Discontinue lamotrigine 3. Return to clinic in one month 4. Contact emergency resources which includes 911, ED, suicide crisis line [phone]) if any worsening in suicidal ideation.  5. Contact for therapy: Dr. Daisy Blossom Schneidmiller  [phone] [address] [place], [place] [address]   The patient demonstrates the following risk factors for suicide: Chronic risk factors for suicide include: psychiatric disorder of depression and previous suicide attempts of having a gun. Acute risk factors for suicide include: unemployment, social withdrawal/isolation and loss (financial, interpersonal, professional).  Protective factors for this patient include: positive social support, coping skills and hope for the future. Considering these factors, the overall suicide risk at this point appears to be low. Patient is appropriate for outpatient follow up.Emergency resources which includes 911, ED, suicide crisis line [phone]) are discussed.   Treatment Plan Summary: Plan as above   Neysa Hotter, MD 08/04/2016, 10:25 AM

## 2016-08-03 ENCOUNTER — Ambulatory Visit (INDEPENDENT_AMBULATORY_CARE_PROVIDER_SITE_OTHER): Payer: 59 | Admitting: Pulmonary Disease

## 2016-08-03 ENCOUNTER — Encounter: Payer: Self-pay | Admitting: Pulmonary Disease

## 2016-08-03 DIAGNOSIS — G4733 Obstructive sleep apnea (adult) (pediatric): Secondary | ICD-10-CM

## 2016-08-03 NOTE — Patient Instructions (Signed)
Exercise regularly, lose weight Avoid drinking any caffeinated beverages afternoon Limit alcohol intake to only one beverage per day in the evenings Don't drive while feeling sleepy We will see you back on an as-needed basis

## 2016-08-03 NOTE — Progress Notes (Signed)
Subjective:    Patient ID: Samantha Turner, female    DOB: March 18, 1961, 56 y.o.   MRN: [id number]  Synopsis: First seen by [facility] in the summer of 2017 when she was hospitalized for A. fib with RVR leading to nonischemic cardiomyopathy. At that time her evaluation demonstrated a mediastinal mass. On further discussion with radiology was felt that this represented a bronchogenic cyst. They recommended an MRI of the chest. She also has symptoms consistent with obstructive sleep apnea and in August 2017 a polysomnogram was ordered. 02/2016 PSG> AHI 17, O2 saturation 87%  HPI Chief Complaint  Patient presents with  . Follow-up    pt doing well, no complaints today.    Lately her breathing has been OK.  She had to go to the hospital for a cardioversion.    She says that she will sometime fee a little sleepy in the afternoons.  She naps in the afternoons.  She has had life long headaches, no real difference in that lately.  She feels well rested in the mornings.    Past Medical History:  Diagnosis Date  . Anxiety   . Bronchogenic cyst   . Depression   . Essential hypertension   . Headache   . History of cardiac catheterization    Normal coronary arteries July 2017  . Nonischemic cardiomyopathy (HCC)   . Obstructive sleep apnea   . Paroxysmal atrial flutter (HCC)       Review of Systems     Objective:   Physical Exam Vitals:   08/03/16 1434  BP: 136/74  Pulse: 76  SpO2: 98%  Weight: 254 lb (115.2 kg)  Height: 5\' 2"  (1.575 m)   RA  Gen: obese, chronically ill appearing HENT: OP clear, TM's clear, neck supple PULM: CTA B, normal percussion CV: RRR, no mgr, trace edema GI: BS+, soft, nontender Derm: no cyanosis or rash Psyche: normal mood and affect     August 2017 MRI chest shows mildly dilated left ventricle with mild concentric hypertrophy LVEF 47% with diffuse hypokinesis, normal right ventricular size and function, mild biatrial dilation, mildly dilated  pulmonary artery, most consistent with nonischemic dilated cardiomyopathy which had improved compared to the echocardiogram in July 2017, there was an over read by Dr. Llana Aliment who said that the 2.8 x 2.3 x 4.8 cm fluid signal intensity structure in the middle mediastinum appear to be benign. He felt it likely represented a bronchogenic cyst versus other benign causes.  Sleep study from September 2017 reviewed showing an AHI of 17, O2 saturation nadir 87%    Assessment & Plan:  OSA (obstructive sleep apnea) Samantha Turner has at least moderate obstructive sleep apnea but despite my best efforts to convince her to consider either a dental appliance for treatment or CPAP therapy she still remains adamant that she does not want any therapy for her sleep apnea.  Today I spent 10 minutes counseling her on the medical complications that comes from untreated sleep apnea including stroke, cardiac complications, or car accidents to name just a few. Despite this, she states that she does not want treated right now.  Plan: Follow-up on an as-needed basis Counseled to lose weight Counseled to not drive while feeling sleepy Counseled on good sleep hygiene.    Current Outpatient Prescriptions:  .  atorvastatin (LIPITOR) 40 MG tablet, Take 1 tablet (40 mg total) by mouth daily at 6 PM. (Patient taking differently: Take 40 mg by mouth at bedtime. ), Disp: 90 tablet, Rfl: 3 .  lamoTRIgine (LAMICTAL) 150 MG tablet, Take 150 mg by mouth daily., Disp: , Rfl:  .  lisinopril (PRINIVIL,ZESTRIL) 5 MG tablet, Take 1 tablet (5 mg total) by mouth daily. (Patient taking differently: Take 5 mg by mouth at bedtime. ), Disp: 90 tablet, Rfl: 3 .  rivaroxaban (XARELTO) 20 MG TABS tablet, Take 1 tablet (20 mg total) by mouth daily with supper. (Patient taking differently: Take 20 mg by mouth daily. ), Disp: 90 tablet, Rfl: 3 .  VALIUM 2 MG tablet, Take 2 mg by mouth daily as needed for anxiety. , Disp: , Rfl:  .  venlafaxine XR  (EFFEXOR-XR) 150 MG 24 hr capsule, Take 1 capsule (150 mg total) by mouth daily with breakfast., Disp: 30 capsule, Rfl: 0 .  venlafaxine XR (EFFEXOR-XR) 37.5 MG 24 hr capsule, Take 1 capsule (37.5 mg total) by mouth daily with breakfast., Disp: 30 capsule, Rfl: 0 .  VENTOLIN HFA 108 (90 Base) MCG/ACT inhaler, Inhale 1-2 puffs into the lungs every 6 (six) hours as needed for shortness of breath or wheezing., Disp: , Rfl: 0

## 2016-08-03 NOTE — Assessment & Plan Note (Addendum)
Samantha Turner has at least moderate obstructive sleep apnea but despite my best efforts to convince her to consider either a dental appliance for treatment or CPAP therapy she still remains adamant that she does not want any therapy for her sleep apnea.  Today I spent 10 minutes counseling her on the medical complications that comes from untreated sleep apnea including stroke, cardiac complications, or car accidents to name just a few. Despite this, she states that she does not want treated right now.  Plan: Follow-up on an as-needed basis Counseled to lose weight Counseled to not drive while feeling sleepy Counseled on good sleep hygiene.

## 2016-08-04 ENCOUNTER — Ambulatory Visit (HOSPITAL_COMMUNITY): Payer: Self-pay | Admitting: Psychiatry

## 2016-08-04 ENCOUNTER — Ambulatory Visit (INDEPENDENT_AMBULATORY_CARE_PROVIDER_SITE_OTHER): Payer: 59 | Admitting: Psychiatry

## 2016-08-04 VITALS — BP 118/90 | HR 73 | Ht 62.0 in | Wt 254.0 lb

## 2016-08-04 DIAGNOSIS — F331 Major depressive disorder, recurrent, moderate: Secondary | ICD-10-CM

## 2016-08-04 DIAGNOSIS — Z818 Family history of other mental and behavioral disorders: Secondary | ICD-10-CM

## 2016-08-04 DIAGNOSIS — Z79899 Other long term (current) drug therapy: Secondary | ICD-10-CM

## 2016-08-04 DIAGNOSIS — R45851 Suicidal ideations: Secondary | ICD-10-CM

## 2016-08-04 MED ORDER — VENLAFAXINE HCL ER 75 MG PO CP24: 75.0000 mg | ORAL_CAPSULE | Freq: Every day | ORAL | 0 refills | Status: DC

## 2016-08-04 MED ORDER — VENLAFAXINE HCL ER 150 MG PO CP24: 150.0000 mg | ORAL_CAPSULE | Freq: Every day | ORAL | 0 refills | Status: DC

## 2016-08-04 NOTE — Patient Instructions (Signed)
1. Increase Effexor 225 mg (150 mg + 75 mg) daily 2.Discontinue lamotrigine 3. Return to clinic in one month 4. Contact emergency resources which includes 911, ED, suicide crisis line [phone]) if any worsening in suicidal ideation.  5. Contact for therapy: Dr. Daisy Blossom Schneidmiller  [phone] [address] [place], [place] [address]

## 2016-08-26 ENCOUNTER — Ambulatory Visit (INDEPENDENT_AMBULATORY_CARE_PROVIDER_SITE_OTHER): Payer: 59 | Admitting: Internal Medicine

## 2016-08-26 ENCOUNTER — Encounter: Payer: Self-pay | Admitting: Internal Medicine

## 2016-08-26 VITALS — BP 118/66 | HR 76 | Ht 62.0 in | Wt 258.0 lb

## 2016-08-26 DIAGNOSIS — I10 Essential (primary) hypertension: Secondary | ICD-10-CM | POA: Diagnosis not present

## 2016-08-26 DIAGNOSIS — G4733 Obstructive sleep apnea (adult) (pediatric): Secondary | ICD-10-CM | POA: Diagnosis not present

## 2016-08-26 DIAGNOSIS — I428 Other cardiomyopathies: Secondary | ICD-10-CM

## 2016-08-26 DIAGNOSIS — I4892 Unspecified atrial flutter: Secondary | ICD-10-CM | POA: Diagnosis not present

## 2016-08-26 DIAGNOSIS — I483 Typical atrial flutter: Secondary | ICD-10-CM | POA: Diagnosis not present

## 2016-08-26 NOTE — Progress Notes (Signed)
Electrophysiology Office Note   Date:  08/26/2016   ID:  Samantha Turner, DOB 1960/07/12, MRN [id number]  PCP:  Kirstie Peri, MD  Cardiologist:  Dr Diona Browner Primary Electrophysiologist: Hillis Range, MD    CC: atrial arrhythmias   History of Present Illness: Samantha Turner is a 56 y.o. female who presents today for electrophysiology evaluation.  Since her recent atrial flutter ablation, she has done well.  Remains in sinus rhythm.  Denies procedure related complications.  Unfortunately, no real progress with lifestyle modification. Today, she denies symptoms of palpitations, chest pain, shortness of breath, orthopnea, PND, lower extremity edema, claudication, dizziness, presyncope, syncope, bleeding, or neurologic sequela. The patient is tolerating medications without difficulties and is otherwise without complaint today.    Past Medical History:  Diagnosis Date  . Anxiety   . Bronchogenic cyst   . Depression   . Essential hypertension   . Headache   . History of cardiac catheterization    Normal coronary arteries July 2017  . Nonischemic cardiomyopathy (HCC)   . Obstructive sleep apnea   . Paroxysmal atrial flutter [facility])    Past Surgical History:  Procedure Laterality Date  . A-FLUTTER ABLATION N/A 07/26/2016   Procedure: A-Flutter Ablation;  Surgeon: Hillis Range, MD;  Location: [facility] INVASIVE CV LAB;  Service: Cardiovascular;  Laterality: N/A;  . ABDOMINAL HYSTERECTOMY    . CARDIAC CATHETERIZATION N/A 12/16/2015   Procedure: Left Heart Cath and Coronary Angiography;  Surgeon: Iran Ouch, MD;  Location: [facility] INVASIVE CV LAB;  Service: Cardiovascular;  Laterality: N/A;  . CARDIOVERSION N/A 12/18/2015   Procedure: CARDIOVERSION;  Surgeon: Lars Masson, MD;  Location: [facility] ENDOSCOPY;  Service: Cardiovascular;  Laterality: N/A;  . CHOLECYSTECTOMY    . TEE WITHOUT CARDIOVERSION N/A 12/18/2015   Procedure: TRANSESOPHAGEAL ECHOCARDIOGRAM (TEE);  Surgeon: Lars Masson, MD;   Location: [facility] ENDOSCOPY;  Service: Cardiovascular;  Laterality: N/A;  . TUMOR REMOVAL  07/02/2015   ovarian     Current Outpatient Prescriptions  Medication Sig Dispense Refill  . amiodarone (PACERONE) 200 MG tablet Take 200 mg by mouth daily.    Marland Kitchen atorvastatin (LIPITOR) 40 MG tablet Take 1 tablet (40 mg total) by mouth daily at 6 PM. (Patient taking differently: Take 40 mg by mouth at bedtime. ) 90 tablet 3  . lisinopril (PRINIVIL,ZESTRIL) 5 MG tablet Take 1 tablet (5 mg total) by mouth daily. (Patient taking differently: Take 5 mg by mouth at bedtime. ) 90 tablet 3  . VALIUM 2 MG tablet Take 2 mg by mouth daily as needed for anxiety.     Marland Kitchen venlafaxine XR (EFFEXOR-XR) 150 MG 24 hr capsule Take 1 capsule (150 mg total) by mouth daily with breakfast. Take total of 225 mg daily 30 capsule 0  . venlafaxine XR (EFFEXOR-XR) 75 MG 24 hr capsule Take 1 capsule (75 mg total) by mouth daily with breakfast. Take total of 225 mg daily 30 capsule 0  . VENTOLIN HFA 108 (90 Base) MCG/ACT inhaler Inhale 1-2 puffs into the lungs every 6 (six) hours as needed for shortness of breath or wheezing.  0  . XARELTO 20 MG TABS tablet Take 1 tablet by mouth daily.     No current facility-administered medications for this visit.     Allergies:   Patient has no known allergies.   Social History:  The patient  reports that she has never smoked. She has never used smokeless tobacco. She reports that she drinks alcohol. She reports that she does not  use drugs.   Family History:  The patient's  family history includes Heart failure in her mother.    ROS:  Please see the history of present illness.   All other systems are reviewed and negative.    PHYSICAL EXAM: VS:  BP 118/66   Pulse 76   Ht 5\' 2"  (1.575 m)   Wt 258 lb (117 kg)   SpO2 96%   BMI 47.19 kg/m  , BMI Body mass index is 47.19 kg/m. GEN: overweight, in no acute distress  HEENT: normal  Neck: no JVD, carotid bruits, or masses Cardiac: RRR    Respiratory:  clear to auscultation bilaterally, normal work of breathing GI: soft, nontender, nondistended, + BS MS: no deformity or atrophy  Skin: warm and dry  Neuro:  Strength and sensation are intact Psych: euthymic mood, full affect  EKG:  EKG is ordered today. The ekg ordered today shows sinus rhythm, nonspecific ST/T changes (personally reviewed)   Recent Labs: 12/14/2015: Magnesium 2.3 12/15/2015: TSH 1.170 12/16/2015: ALT 13 12/19/2015: Hemoglobin 11.7 07/19/2016: BUN 10; Creatinine, Ser 0.84; Platelets 306; Potassium 4.6; Sodium 142    Lipid Panel     Component Value Date/Time   CHOL 214 (H) 12/16/2015 0322   TRIG 399 (H) 12/16/2015 0322   HDL 25 (L) 12/16/2015 0322   CHOLHDL 8.6 12/16/2015 0322   VLDL 80 (H) 12/16/2015 0322   LDLCALC 109 (H) 12/16/2015 0322     Wt Readings from Last 3 Encounters:  08/26/16 258 lb (117 kg)  08/03/16 254 lb (115.2 kg)  07/26/16 250 lb (113.4 kg)    ASSESSMENT AND PLAN:  1.  Typical atrial flutter Resolved s/p ablation Stop xarelto and amiodarone Lifestyle modification is very important in order to prevent afib in the future.  I worry that though we have had extensive discussions about this, she has gained 8 lbs in the last 4 weeks.  2. OSA Importance of treatment to avoid afib long term discussed with patient again today  3. Obesity I worry that she is at risk for afib long term.  Lifestyle modification encouraged.  8 lb weight gain in last month is noted  4. nonischemic CM EF recovered Will do better in sinus long term  5. htn Stable No change required today  No further EP workup at this time  Follow-up with Dr Diona Browner as scheduled  I will see as needed going forward  Signed, Hillis Range, MD    [facility] [address] [place] [address] [phone] (office) [phone] (fax)

## 2016-08-26 NOTE — Patient Instructions (Signed)
Medication Instructions:    Your physician has recommended you make the following change in your medication:  1) STOP Amiodarone 2) STOP Xarelto  --- If you need a refill on your cardiac medications before your next appointment, please call your pharmacy. ---  Labwork:  None ordered  Testing/Procedures:  None ordered  Follow-Up:  No follow up is needed at this time with Dr. Johney Frame.  He will see you on an as needed basis.  Thank you for choosing [facility]!!

## 2016-08-30 ENCOUNTER — Telehealth (HOSPITAL_COMMUNITY): Payer: Self-pay | Admitting: *Deleted

## 2016-08-30 ENCOUNTER — Other Ambulatory Visit (HOSPITAL_COMMUNITY): Payer: Self-pay | Admitting: Psychiatry

## 2016-08-30 MED ORDER — VENLAFAXINE HCL ER 150 MG PO CP24: 150.0000 mg | ORAL_CAPSULE | Freq: Every day | ORAL | 0 refills | Status: DC

## 2016-08-30 MED ORDER — VENLAFAXINE HCL ER 75 MG PO CP24: 75.0000 mg | ORAL_CAPSULE | Freq: Every day | ORAL | 0 refills | Status: DC

## 2016-08-30 NOTE — Telephone Encounter (Signed)
Reordered her medication to last until the next appointment.

## 2016-08-30 NOTE — Telephone Encounter (Signed)
noted 

## 2016-08-30 NOTE — Telephone Encounter (Signed)
Pt called to resch her appt for 08-31-2016 due to going out of town. Per pt she is leaving at 8am tomorrow and would like to resch appt. Staff rescheduled pt appt for April 9th. Per pt chart, pt only seen provider twice. Per pt chart, her Effexor XR 150 mg and Effexor XR 75 mg where last filled on 08-04-2016 with 30 tabs with 0 refills.

## 2016-08-30 NOTE — Telephone Encounter (Signed)
Called pt to inform her with what provider stated. Pt verbalized understanding.  

## 2016-09-01 ENCOUNTER — Ambulatory Visit (HOSPITAL_COMMUNITY): Payer: Self-pay | Admitting: Psychiatry

## 2016-09-08 NOTE — Progress Notes (Signed)
BH MD/PA/NP OP Progress Note  09/12/2016 10:38 AM Samantha Turner  MRN:  [id number]  Chief Complaint:  Chief Complaint    Depression; Follow-up     Subjective:  "My daughter overdosed the other day" HPI:  Per chart review since the last appointment  - Aflutter resolved s/p ablation. Discontinued xarelto and amiodarone - patient is diagnosed with sleep apnea, and she refused CPAP machine  Patient presents for follow-up appointment. She states that her daughter attempted suicide and she found her daughter not breathing at home. She was instructed to do CPR, although she did not know what to do. She later finds out that her daughter likely overdosed on cocaine; it was very shocking for her, as her daughter was the last person to do it. She felt guilty that she did not raise her daughter right. She also admits that it reminded her of her brother who she also raised, who completed suicide. She felt more depressed lately and had SI with plan to shoot herself. She denies any intent. She felt more hands tremors and insomnia with night time awakening since uptitration of Effexor. She had panic attack every day and takes Valium regularly. She denies AH/VH. She was able to discontinue lamotrigine.   Visit Diagnosis:    ICD-9-CM ICD-10-CM   1. Major depressive disorder, recurrent episode, moderate (HCC) 296.32 F33.1     Past Psychiatric History:  Outpatient: denies Psychiatry admission: denies Previous suicide attempt: She had a gun and her husband prevented in 2016  Past trials of medication: sertraline, Prozac, Paxil, Lexapro, Effexor, Lamotrigine History of violence: denies  Past Medical History:  Past Medical History:  Diagnosis Date  . Anxiety   . Bronchogenic cyst   . Depression   . Essential hypertension   . Headache   . History of cardiac catheterization    Normal coronary arteries July 2017  . Nonischemic cardiomyopathy (HCC)   . Obstructive sleep apnea   . Paroxysmal atrial  flutter [facility])     Past Surgical History:  Procedure Laterality Date  . A-FLUTTER ABLATION N/A 07/26/2016   Procedure: A-Flutter Ablation;  Surgeon: Hillis Range, MD;  Location: [facility] INVASIVE CV LAB;  Service: Cardiovascular;  Laterality: N/A;  . ABDOMINAL HYSTERECTOMY    . CARDIAC CATHETERIZATION N/A 12/16/2015   Procedure: Left Heart Cath and Coronary Angiography;  Surgeon: Iran Ouch, MD;  Location: [facility] INVASIVE CV LAB;  Service: Cardiovascular;  Laterality: N/A;  . CARDIOVERSION N/A 12/18/2015   Procedure: CARDIOVERSION;  Surgeon: Lars Masson, MD;  Location: [facility] ENDOSCOPY;  Service: Cardiovascular;  Laterality: N/A;  . CHOLECYSTECTOMY    . TEE WITHOUT CARDIOVERSION N/A 12/18/2015   Procedure: TRANSESOPHAGEAL ECHOCARDIOGRAM (TEE);  Surgeon: Lars Masson, MD;  Location: [facility] ENDOSCOPY;  Service: Cardiovascular;  Laterality: N/A;  . TUMOR REMOVAL  07/02/2015   ovarian    Family Psychiatric History:  Brother- attempted suicide in January 2017  Family History:  Family History  Problem Relation Age of Onset  . Heart failure Mother     Social History:  Social History   Social History  . Marital status: Single    Spouse name: N/A  . Number of children: N/A  . Years of education: N/A   Social History Main Topics  . Smoking status: Never Smoker  . Smokeless tobacco: Never Used  . Alcohol use Yes     Comment: rarel  . Drug use: No  . Sexual activity: Yes    Birth control/ protection: Surgical   Other  Topics Concern  . None   Social History Narrative  . None    Allergies: No Known Allergies  Metabolic Disorder Labs: Lab Results  Component Value Date   HGBA1C 5.7 (H) 12/15/2015   MPG 117 12/15/2015   No results found for: PROLACTIN Lab Results  Component Value Date   CHOL 214 (H) 12/16/2015   TRIG 399 (H) 12/16/2015   HDL 25 (L) 12/16/2015   CHOLHDL 8.6 12/16/2015   VLDL 80 (H) 12/16/2015   LDLCALC 109 (H) 12/16/2015     Current Medications: Current  Outpatient Prescriptions  Medication Sig Dispense Refill  . atorvastatin (LIPITOR) 40 MG tablet Take 1 tablet (40 mg total) by mouth daily at 6 PM. (Patient taking differently: Take 40 mg by mouth at bedtime. ) 90 tablet 3  . lisinopril (PRINIVIL,ZESTRIL) 5 MG tablet Take 1 tablet (5 mg total) by mouth daily. (Patient taking differently: Take 5 mg by mouth at bedtime. ) 90 tablet 3  . venlafaxine XR (EFFEXOR-XR) 150 MG 24 hr capsule Take 1 capsule (150 mg total) by mouth daily with breakfast. 30 capsule 0  . VENTOLIN HFA 108 (90 Base) MCG/ACT inhaler Inhale 1-2 puffs into the lungs every 6 (six) hours as needed for shortness of breath or wheezing.  0  . buPROPion (WELLBUTRIN XL) 150 MG 24 hr tablet Take 1 tablet (150 mg total) by mouth daily. 30 tablet 0  . diazepam (VALIUM) 2 MG tablet Take 1 tablet (2 mg total) by mouth daily as needed for anxiety. 30 tablet 0   No current facility-administered medications for this visit.     Neurologic: Headache: Yes Seizure: No Paresthesias: No  Musculoskeletal: Strength & Muscle Tone: within normal limits Gait & Station: normal Patient leans: N/A  Psychiatric Specialty Exam: Review of Systems  Psychiatric/Behavioral: Positive for depression and suicidal ideas. Negative for hallucinations and substance abuse. The patient is nervous/anxious and has insomnia.   All other systems reviewed and are negative.   Blood pressure 123/85, pulse 74, height 5\' 2"  (1.575 m), weight 256 lb 9.6 oz (116.4 kg), SpO2 91 %.Body mass index is 46.93 kg/m.  General Appearance: Fairly Groomed  Eye Contact:  Good  Speech:  Clear and Coherent  Volume:  Normal  Mood:  Depressed  Affect:  Tearful and down  Thought Process:  Coherent and Goal Directed  Orientation:  Full (Time, Place, and Person)  Thought Content: Logical  Perceptions: denies AH/VH  Suicidal Thoughts:  Yes.  without intent/plan (with plan to shoot herself)  Homicidal Thoughts:  No  Memory:  Immediate;    Good Recent;   Good Remote;   Good  Judgement:  Good  Insight:  Fair  Psychomotor Activity:  Normal  Concentration:  Concentration: Good and Attention Span: Good  Recall:  Good  Fund of Knowledge: Good  Language: Good  Akathisia:  No  Handed:  Right  AIMS (if indicated):  N/A  Assets:  Communication Skills Desire for Improvement  ADL's:  Intact  Cognition: WNL  Sleep:  poor   Assessment Eizabeth Zacarias is  56 year old female with depression, chronic systolic dysfunction with NYHA Class II/III CHF in the setting of atrial flutter, OSA who presents for follow up appointment for depression. Psychosocial stressors including her brother who committed suicide in January 2017 and her medical condition.   # MDD She endorses neurovegetative symptoms in the setting of significant stressor of her daughter attempted suicide since the last appointment. Unfortunately she complains of hand tremors  and insomnia since uptitration of Effexor; will decrease the dose to avoid adverse reaction (she responded well to 150 mg). Will add Wellbutrin as adjunctive treatment; discussed risk of seizure, headache and worsening anxiety. Continue Valium for anxiety. She will greatly benefit from CBT/supportive therapy to process loss, recent incidence of her daughter and demoralization from her physical condition. Provided referral information as below. Patient is advised to contact the clinic if she experiences worsening in her mood symptoms/adverse reaction. She agrees for safety plan which includes removing a gun and contact available resources as needed as below.   Plan 1. Decrease Effexor 150 mg daily 2. Start Wellbutrin 150 mg daily  3. Continue Valium 2 mg daily as needed for anxiety 4. Return to clinic in one month 5. Contact emergency resources which includes 911, ED, suicide crisis line [phone]) if any worsening in suicidal ideation.  6. Contact for therapy: Dr. Daisy Blossom Schneidmiller  [phone] [address], [place], [place] [address]   The patient demonstrates the following risk factors for suicide: Chronic risk factors for suicide include: psychiatric disorder of depression and previous suicide attempts of having a gun. Acute risk factors for suicide include: unemployment, social withdrawal/isolation and loss (financial, interpersonal, professional). Protective factors for this patient include: positive social support, coping skills and hope for the future. Considering these factors, the overall suicide risk at this point appears chronically elevated, but not at imminent danger to self. Patient is appropriate for outpatient follow up.Emergency resources which includes 911, ED, suicide crisis line [phone]) are discussed. Patient agrees to remove gun from the house.   Treatment Plan Summary: Plan as above   Neysa Hotter, MD 09/12/2016, 10:38 AM

## 2016-09-12 ENCOUNTER — Ambulatory Visit (INDEPENDENT_AMBULATORY_CARE_PROVIDER_SITE_OTHER): Payer: 59 | Admitting: Psychiatry

## 2016-09-12 ENCOUNTER — Encounter (HOSPITAL_COMMUNITY): Payer: Self-pay | Admitting: Psychiatry

## 2016-09-12 VITALS — BP 123/85 | HR 74 | Ht 62.0 in | Wt 256.6 lb

## 2016-09-12 DIAGNOSIS — R45851 Suicidal ideations: Secondary | ICD-10-CM | POA: Diagnosis not present

## 2016-09-12 DIAGNOSIS — Z79899 Other long term (current) drug therapy: Secondary | ICD-10-CM | POA: Diagnosis not present

## 2016-09-12 DIAGNOSIS — F331 Major depressive disorder, recurrent, moderate: Secondary | ICD-10-CM

## 2016-09-12 MED ORDER — DIAZEPAM 2 MG PO TABS: 2.0000 mg | ORAL_TABLET | Freq: Every day | ORAL | 0 refills | Status: DC | PRN

## 2016-09-12 MED ORDER — BUPROPION HCL ER (XL) 150 MG PO TB24: 150.0000 mg | ORAL_TABLET | Freq: Every day | ORAL | 0 refills | Status: DC

## 2016-09-12 MED ORDER — VENLAFAXINE HCL ER 150 MG PO CP24: 150.0000 mg | ORAL_CAPSULE | Freq: Every day | ORAL | 0 refills | Status: DC

## 2016-09-12 NOTE — Patient Instructions (Addendum)
1. Decrease Effexor 150 mg daily 2. Start Wellbutrin 150 mg daily  3. Continue Valium 2 mg daily as needed for anxiety 4. Return to clinic in one month 5. Contact emergency resources which includes 911, ED, suicide crisis line [phone]) if any worsening in suicidal ideation.  6. Contact for therapy: Dr. Daisy Blossom Schneidmiller  [phone] [address], [place], [place] [address]

## 2016-09-13 ENCOUNTER — Ambulatory Visit (INDEPENDENT_AMBULATORY_CARE_PROVIDER_SITE_OTHER): Payer: 59 | Admitting: Cardiology

## 2016-09-13 ENCOUNTER — Encounter: Payer: Self-pay | Admitting: Cardiology

## 2016-09-13 VITALS — BP 119/73 | HR 70 | Ht 63.0 in | Wt 257.4 lb

## 2016-09-13 DIAGNOSIS — Z8679 Personal history of other diseases of the circulatory system: Secondary | ICD-10-CM

## 2016-09-13 DIAGNOSIS — G4733 Obstructive sleep apnea (adult) (pediatric): Secondary | ICD-10-CM | POA: Diagnosis not present

## 2016-09-13 DIAGNOSIS — I10 Essential (primary) hypertension: Secondary | ICD-10-CM | POA: Diagnosis not present

## 2016-09-13 NOTE — Progress Notes (Signed)
Cardiology Office Note  Date: 09/13/2016   ID: Samantha Turner, DOB 12-14-1960, MRN [id number]  PCP: Kirstie Peri, MD  Primary Cardiologist: Nona Dell, MD   Chief Complaint  Patient presents with  . History of atrial flutter    History of Present Illness: Samantha Turner is a 56 y.o. female last seen in February. She underwent an atrial flutter ablation with Dr. Johney Frame on February 20 and was seen by him in follow-up just recently in late March. She presents today for a follow-up visit. Reports no progressive shortness of breath, no palpitations or chest pain. She states that she is trying to look at her diet more carefully. We discussed risk factor modification and also a regular exercise walking plan.  Echocardiogram from October 2017 revealed LVEF 55%. Current medications include Lipitor and lisinopril. She is no longer on anticoagulation or amiodarone.  She continues to follow with Dr. Sherryll Burger, reports three-month interval visits.  Past Medical History:  Diagnosis Date  . Anxiety   . Bronchogenic cyst   . Depression   . Essential hypertension   . Headache   . History of cardiac catheterization    Normal coronary arteries July 2017  . Nonischemic cardiomyopathy (HCC)   . Obstructive sleep apnea   . Paroxysmal atrial flutter (HCC)    RFA 07/2016 - Dr. Johney Frame    Past Surgical History:  Procedure Laterality Date  . A-FLUTTER ABLATION N/A 07/26/2016   Procedure: A-Flutter Ablation;  Surgeon: Hillis Range, MD;  Location: [facility] INVASIVE CV LAB;  Service: Cardiovascular;  Laterality: N/A;  . ABDOMINAL HYSTERECTOMY    . CARDIAC CATHETERIZATION N/A 12/16/2015   Procedure: Left Heart Cath and Coronary Angiography;  Surgeon: Iran Ouch, MD;  Location: [facility] INVASIVE CV LAB;  Service: Cardiovascular;  Laterality: N/A;  . CARDIOVERSION N/A 12/18/2015   Procedure: CARDIOVERSION;  Surgeon: Lars Masson, MD;  Location: [facility] ENDOSCOPY;  Service: Cardiovascular;  Laterality: N/A;  .  CHOLECYSTECTOMY    . TEE WITHOUT CARDIOVERSION N/A 12/18/2015   Procedure: TRANSESOPHAGEAL ECHOCARDIOGRAM (TEE);  Surgeon: Lars Masson, MD;  Location: [facility] ENDOSCOPY;  Service: Cardiovascular;  Laterality: N/A;  . TUMOR REMOVAL  07/02/2015   ovarian    Current Outpatient Prescriptions  Medication Sig Dispense Refill  . atorvastatin (LIPITOR) 40 MG tablet Take 1 tablet (40 mg total) by mouth daily at 6 PM. (Patient taking differently: Take 40 mg by mouth at bedtime. ) 90 tablet 3  . buPROPion (WELLBUTRIN XL) 150 MG 24 hr tablet Take 1 tablet (150 mg total) by mouth daily. 30 tablet 0  . diazepam (VALIUM) 2 MG tablet Take 1 tablet (2 mg total) by mouth daily as needed for anxiety. 30 tablet 0  . lisinopril (PRINIVIL,ZESTRIL) 5 MG tablet Take 1 tablet (5 mg total) by mouth daily. (Patient taking differently: Take 5 mg by mouth at bedtime. ) 90 tablet 3  . venlafaxine XR (EFFEXOR-XR) 150 MG 24 hr capsule Take 1 capsule (150 mg total) by mouth daily with breakfast. 30 capsule 0  . VENTOLIN HFA 108 (90 Base) MCG/ACT inhaler Inhale 1-2 puffs into the lungs every 6 (six) hours as needed for shortness of breath or wheezing.  0   No current facility-administered medications for this visit.    Allergies:  Patient has no known allergies.   Social History: The patient  reports that she has never smoked. She has never used smokeless tobacco. She reports that she drinks alcohol. She reports that she does not use drugs.  ROS:  Please see the history of present illness. Otherwise, complete review of systems is positive for none.  All other systems are reviewed and negative.   Physical Exam: VS:  BP 119/73 (BP Location: Right Arm, Patient Position: Sitting, Cuff Size: Normal)   Pulse 70   Ht 5\' 3"  (1.6 m)   Wt 257 lb 6.4 oz (116.8 kg)   SpO2 97%   BMI 45.60 kg/m , BMI Body mass index is 45.6 kg/m.  Wt Readings from Last 3 Encounters:  09/13/16 257 lb 6.4 oz (116.8 kg)  08/26/16 258 lb (117 kg)    08/03/16 254 lb (115.2 kg)    General: Obese woman,appears comfortable at rest. HEENT: Conjunctiva and lids normal, oropharynx clear. Neck: Supple, no elevated JVP or carotid bruits, no thyromegaly. Lungs: Decreased breath sounds but clear to auscultation, nonlabored breathing at rest. Cardiac: Regular rate and rhythm, no S3 or significant systolic murmur, no pericardial rub. Abdomen: Soft, nontender, bowel sounds present, no guarding or rebound. Extremities: No pitting edema, distal pulses 2+. Skin: Warm and dry. Musculoskeletal: No kyphosis. Neuropsychiatric: Alert and oriented x3, affect grossly appropriate.  ECG: I personally reviewed the tracing from 08/26/2016 which showed normal sinus rhythm with low voltage and nonspecific ST-T changes.  Recent Labwork: 12/14/2015: Magnesium 2.3 12/15/2015: TSH 1.170 12/16/2015: ALT 13; AST 14 12/19/2015: Hemoglobin 11.7 07/19/2016: BUN 10; Creatinine, Ser 0.84; Platelets 306; Potassium 4.6; Sodium 142     Component Value Date/Time   CHOL 214 (H) 12/16/2015 0322   TRIG 399 (H) 12/16/2015 0322   HDL 25 (L) 12/16/2015 0322   CHOLHDL 8.6 12/16/2015 0322   VLDL 80 (H) 12/16/2015 0322   LDLCALC 109 (H) 12/16/2015 0322    Other Studies Reviewed Today:  Echocardiogram 03/23/2016: Study Conclusions  - Left ventricle: The cavity size was normal. Wall thickness was   normal. The estimated ejection fraction was 55%. Wall motion was   normal; there were no regional wall motion abnormalities. Left   ventricular diastolic function parameters were normal. - Aortic valve: Mildly calcified annulus. Trileaflet. - Mitral valve: There was trivial regurgitation. - Left atrium: The atrium was at the upper limits of normal in   size. - Right atrium: Central venous pressure (est): 3 mm Hg. - Tricuspid valve: There was trivial regurgitation. - Pulmonary arteries: Systolic pressure could not be accurately   estimated. - Pericardium, extracardiac: There  was no pericardial effusion.  Impressions:  - Normal LV wall thickness with LVEF approximately 55%. There has   been significant improvement in LVEF compared to the study from   July. Grossly normal diastolic function. Upper normal left atrial   chamber size. Trivial mitral and tricuspid regurgitation.  Assessment and Plan:  1. Typical atrial flutter status post radio frequency ablation by Dr. Johney Frame in February. She has maintained sinus rhythm, reports no palpitations, and is now off amiodarone and Xarelto. We'll continue with observation at this point. We did discuss risk factor modification including diet and exercise.  2. History of tachycardia-mediated cardiomyopathy with normalization of LVEF. Continue with observation. She remains on ACE inhibitor for concurrent treatment of hypertension.  3. Essential hypertension, blood pressure well controlled today. No changes in current regimen.  4. Obesity and obstructive sleep apnea. She follows with Dr. Sherryll Burger. We discussed diet and exercise with plan for weight loss.  Current medicines were reviewed with the patient today.  Disposition: Follow-up in one year.  Signed, Jonelle Sidle, MD, [facility] 09/13/2016 10:58 AM    [facility] [place], [place], [place] [address] Phone: [phone]; Fax: [phone]

## 2016-09-13 NOTE — Patient Instructions (Signed)
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
## 2016-10-12 ENCOUNTER — Encounter (HOSPITAL_COMMUNITY): Payer: Self-pay | Admitting: Psychiatry

## 2016-10-12 ENCOUNTER — Ambulatory Visit (INDEPENDENT_AMBULATORY_CARE_PROVIDER_SITE_OTHER): Payer: 59 | Admitting: Psychiatry

## 2016-10-12 VITALS — BP 104/72 | HR 69 | Ht 62.0 in | Wt 257.8 lb

## 2016-10-12 DIAGNOSIS — G4733 Obstructive sleep apnea (adult) (pediatric): Secondary | ICD-10-CM

## 2016-10-12 DIAGNOSIS — Z79899 Other long term (current) drug therapy: Secondary | ICD-10-CM | POA: Diagnosis not present

## 2016-10-12 DIAGNOSIS — F331 Major depressive disorder, recurrent, moderate: Secondary | ICD-10-CM | POA: Diagnosis not present

## 2016-10-12 DIAGNOSIS — R45851 Suicidal ideations: Secondary | ICD-10-CM | POA: Diagnosis not present

## 2016-10-12 DIAGNOSIS — Z818 Family history of other mental and behavioral disorders: Secondary | ICD-10-CM | POA: Diagnosis not present

## 2016-10-12 DIAGNOSIS — I5022 Chronic systolic (congestive) heart failure: Secondary | ICD-10-CM

## 2016-10-12 DIAGNOSIS — I484 Atypical atrial flutter: Secondary | ICD-10-CM

## 2016-10-12 MED ORDER — DIAZEPAM 2 MG PO TABS: 2.0000 mg | ORAL_TABLET | Freq: Every day | ORAL | 1 refills | Status: DC | PRN

## 2016-10-12 MED ORDER — ARIPIPRAZOLE 2 MG PO TABS: 2.0000 mg | ORAL_TABLET | Freq: Every day | ORAL | 1 refills | Status: DC

## 2016-10-12 MED ORDER — VENLAFAXINE HCL ER 150 MG PO CP24: 150.0000 mg | ORAL_CAPSULE | Freq: Every day | ORAL | 1 refills | Status: DC

## 2016-10-12 NOTE — Patient Instructions (Signed)
1. Continue Effexor 150 mg daily 2. Discontinue Wellbutrin 3. Start Abilify 2 mg daily 3. Continue Valium 2 mg daily as needed for anxiety 4. Return to clinic in two months 5. Contact emergency resources which includes 911, ED, suicide crisis line [phone]) if any worsening in suicidal ideation.  6. Would recommend remove guns from your house

## 2016-11-05 NOTE — Addendum Note (Signed)
Addendum  created 11/05/16 0949 by Moniqua Engebretsen, MD   Sign clinical note    

## 2016-12-08 ENCOUNTER — Other Ambulatory Visit (HOSPITAL_COMMUNITY): Payer: Self-pay | Admitting: Psychiatry

## 2016-12-08 ENCOUNTER — Other Ambulatory Visit (HOSPITAL_COMMUNITY): Payer: Self-pay | Admitting: *Deleted

## 2016-12-08 MED ORDER — VENLAFAXINE HCL ER 150 MG PO CP24: 150.0000 mg | ORAL_CAPSULE | Freq: Every day | ORAL | 1 refills | Status: DC

## 2016-12-08 MED ORDER — ARIPIPRAZOLE 2 MG PO TABS
2.0000 mg | ORAL_TABLET | Freq: Every day | ORAL | 0 refills | Status: DC
Start: 2016-12-08 — End: 2016-12-13

## 2016-12-08 MED ORDER — VENLAFAXINE HCL ER 150 MG PO CP24: 150.0000 mg | ORAL_CAPSULE | Freq: Every day | ORAL | 0 refills | Status: DC

## 2016-12-08 NOTE — Telephone Encounter (Signed)
No refill until the next follow up appointment

## 2016-12-08 NOTE — Telephone Encounter (Signed)
  Spoke with patent & informed Per Dr Vanetta Shawl : No refill until the next follow up appointment  F/U on 7/10 @ 10:30
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
## 2016-12-13 ENCOUNTER — Encounter (HOSPITAL_COMMUNITY): Payer: Self-pay | Admitting: Psychiatry

## 2016-12-13 ENCOUNTER — Ambulatory Visit (INDEPENDENT_AMBULATORY_CARE_PROVIDER_SITE_OTHER): Payer: 59 | Admitting: Psychiatry

## 2016-12-13 VITALS — BP 128/88 | HR 76 | Ht 62.0 in | Wt 241.8 lb

## 2016-12-13 DIAGNOSIS — I5022 Chronic systolic (congestive) heart failure: Secondary | ICD-10-CM

## 2016-12-13 DIAGNOSIS — G4733 Obstructive sleep apnea (adult) (pediatric): Secondary | ICD-10-CM | POA: Diagnosis not present

## 2016-12-13 DIAGNOSIS — F331 Major depressive disorder, recurrent, moderate: Secondary | ICD-10-CM | POA: Diagnosis not present

## 2016-12-13 MED ORDER — VENLAFAXINE HCL ER 150 MG PO CP24: 150.0000 mg | ORAL_CAPSULE | Freq: Every day | ORAL | 1 refills | Status: DC

## 2016-12-13 MED ORDER — ARIPIPRAZOLE 2 MG PO TABS: 2.0000 mg | ORAL_TABLET | Freq: Every day | ORAL | 1 refills | Status: DC

## 2016-12-13 MED ORDER — DIAZEPAM 2 MG PO TABS: ORAL_TABLET | ORAL | 1 refills | Status: DC

## 2016-12-13 NOTE — Patient Instructions (Signed)
1. Continue Effexor 150 mg daily 2, Continue Abilify 2 mg daily 3. Decrease Valium 1-2 mg daily as needed for anxiety 4. Return to clinic in two months

## 2017-01-09 ENCOUNTER — Ambulatory Visit (HOSPITAL_COMMUNITY): Payer: Self-pay | Admitting: Psychiatry
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
## 2017-02-06 ENCOUNTER — Other Ambulatory Visit: Payer: Self-pay | Admitting: Physician Assistant

## 2017-02-09 ENCOUNTER — Ambulatory Visit (INDEPENDENT_AMBULATORY_CARE_PROVIDER_SITE_OTHER): Payer: 59 | Admitting: Psychiatry

## 2017-02-09 VITALS — BP 164/94 | HR 75 | Ht 62.0 in | Wt 236.6 lb

## 2017-02-09 DIAGNOSIS — F419 Anxiety disorder, unspecified: Secondary | ICD-10-CM

## 2017-02-09 DIAGNOSIS — F331 Major depressive disorder, recurrent, moderate: Secondary | ICD-10-CM

## 2017-02-09 DIAGNOSIS — Z634 Disappearance and death of family member: Secondary | ICD-10-CM | POA: Diagnosis not present

## 2017-02-09 MED ORDER — DIAZEPAM 2 MG PO TABS: ORAL_TABLET | ORAL | 1 refills | Status: DC

## 2017-02-09 MED ORDER — VENLAFAXINE HCL ER 150 MG PO CP24: 150.0000 mg | ORAL_CAPSULE | Freq: Every day | ORAL | 1 refills | Status: DC

## 2017-02-09 MED ORDER — ARIPIPRAZOLE 2 MG PO TABS: 2.0000 mg | ORAL_TABLET | Freq: Every day | ORAL | 1 refills | Status: DC

## 2017-02-09 NOTE — Patient Instructions (Signed)
1. Continue Effexor 150 mg daily 2. Continue Abilify 2 mg daily 3. Continue valium 1-2 mg daily as needed for anxiety 4. Return to clinic in one month for 30 mins
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
## 2017-03-10 ENCOUNTER — Ambulatory Visit (INDEPENDENT_AMBULATORY_CARE_PROVIDER_SITE_OTHER): Payer: 59 | Admitting: Psychiatry

## 2017-03-10 VITALS — BP 128/73 | HR 74 | Ht 62.01 in | Wt 240.6 lb

## 2017-03-10 DIAGNOSIS — Z818 Family history of other mental and behavioral disorders: Secondary | ICD-10-CM

## 2017-03-10 DIAGNOSIS — F331 Major depressive disorder, recurrent, moderate: Secondary | ICD-10-CM | POA: Diagnosis not present

## 2017-03-10 DIAGNOSIS — Z79899 Other long term (current) drug therapy: Secondary | ICD-10-CM

## 2017-03-10 DIAGNOSIS — G4733 Obstructive sleep apnea (adult) (pediatric): Secondary | ICD-10-CM

## 2017-03-10 MED ORDER — VENLAFAXINE HCL ER 150 MG PO CP24: 150.0000 mg | ORAL_CAPSULE | Freq: Every day | ORAL | 1 refills | Status: DC

## 2017-03-10 MED ORDER — ARIPIPRAZOLE 2 MG PO TABS: 2.0000 mg | ORAL_TABLET | Freq: Every day | ORAL | 1 refills | Status: DC

## 2017-03-10 NOTE — Patient Instructions (Signed)
1. Continue Effexor 150 mg daily 2. Continue Abilify 2 mg daily 3. Continue Valium 1-2 mg daily as needed for anxiety 4. Return to clinic in two months for 30 mins

## 2017-03-23 ENCOUNTER — Telehealth (HOSPITAL_COMMUNITY): Payer: Self-pay | Admitting: *Deleted

## 2017-03-23 NOTE — Telephone Encounter (Signed)
Pt pharmacy [facility] faxed office refill request for pt Diazepam 2 mg 1-2 mg QD PRN. Per pt chart, per notes on 03-10-2017, pt was instructed to continue her Diazepam. Also per pt chart, this medication is not on pt current list of medications. Pt pharmacy number is [phone].

## 2017-03-23 NOTE — Telephone Encounter (Signed)
I believe she declined the refill last time. Ask the patient if she needs this medication

## 2017-03-27 NOTE — Telephone Encounter (Signed)
noted 
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
## 2017-05-10 ENCOUNTER — Ambulatory Visit (INDEPENDENT_AMBULATORY_CARE_PROVIDER_SITE_OTHER): Payer: BLUE CROSS/BLUE SHIELD | Admitting: Psychiatry

## 2017-05-10 VITALS — BP 124/82 | HR 86 | Ht 62.0 in | Wt 247.0 lb

## 2017-05-10 DIAGNOSIS — F331 Major depressive disorder, recurrent, moderate: Secondary | ICD-10-CM | POA: Diagnosis not present

## 2017-05-10 MED ORDER — ARIPIPRAZOLE 2 MG PO TABS: 2.0000 mg | ORAL_TABLET | Freq: Every day | ORAL | 0 refills | Status: DC

## 2017-05-10 MED ORDER — DIAZEPAM 2 MG PO TABS: 1.0000 mg | ORAL_TABLET | Freq: Four times a day (QID) | ORAL | 2 refills | Status: DC | PRN

## 2017-05-10 MED ORDER — VENLAFAXINE HCL ER 150 MG PO CP24: 150.0000 mg | ORAL_CAPSULE | Freq: Every day | ORAL | 0 refills | Status: DC

## 2017-05-10 NOTE — Patient Instructions (Addendum)
1. Continue Effexor 150 mg daily  2. Continue Abilify 2 mg daily 3. Continue Valium 1 mg daily as needed for anxiety  4. Return to clinic in three months for 15 mins
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
## 2017-08-08 ENCOUNTER — Ambulatory Visit (INDEPENDENT_AMBULATORY_CARE_PROVIDER_SITE_OTHER): Payer: BLUE CROSS/BLUE SHIELD | Admitting: Psychiatry

## 2017-08-08 ENCOUNTER — Encounter (HOSPITAL_COMMUNITY): Payer: Self-pay | Admitting: Psychiatry

## 2017-08-08 VITALS — BP 144/82 | HR 83 | Ht 62.0 in | Wt 255.0 lb

## 2017-08-08 DIAGNOSIS — F419 Anxiety disorder, unspecified: Secondary | ICD-10-CM

## 2017-08-08 DIAGNOSIS — G4700 Insomnia, unspecified: Secondary | ICD-10-CM | POA: Diagnosis not present

## 2017-08-08 DIAGNOSIS — R450 Nervousness: Secondary | ICD-10-CM

## 2017-08-08 DIAGNOSIS — F331 Major depressive disorder, recurrent, moderate: Secondary | ICD-10-CM

## 2017-08-08 MED ORDER — ARIPIPRAZOLE 2 MG PO TABS: 2.0000 mg | ORAL_TABLET | Freq: Every day | ORAL | 0 refills | Status: DC

## 2017-08-08 MED ORDER — DIAZEPAM 2 MG PO TABS: 1.0000 mg | ORAL_TABLET | Freq: Every day | ORAL | 0 refills | Status: DC | PRN

## 2017-08-08 MED ORDER — VENLAFAXINE HCL ER 150 MG PO CP24: 150.0000 mg | ORAL_CAPSULE | Freq: Every day | ORAL | 0 refills | Status: DC

## 2017-08-08 NOTE — Patient Instructions (Signed)
1. Continue Effexor 150 mg daily - could not tolerate higher dose due to insomnia 2. Continue Abilify 2 mg daily 3. Continue Valium 1 mg daily as needed for anxiety 4. Return to clinic in three months for 15 mins

## 2017-09-19 NOTE — Progress Notes (Signed)
Cardiology Office Note  Date: 09/20/2017   ID: Samantha Turner, DOB May 10, 1961, MRN [id number]  PCP: Samantha Peri, MD  Primary Cardiologist: Samantha Dell, MD   Chief Complaint  Patient presents with  . History of atrial flutter    History of Present Illness: Samantha Turner is a 57 y.o. female last seen in April 2018.  She presents for a routine follow-up visit.  Since last encounter she does not report any palpitations or increasing shortness of breath.  No dizziness or syncope.  I personally reviewed her ECG today which shows sinus rhythm with low voltage.  I went over her current medications which are outlined below.  She reports no other major health changes and continues to follow with Dr. Sherryll Turner.  Past Medical History:  Diagnosis Date  . Anxiety   . Bronchogenic cyst   . Depression   . Essential hypertension   . Headache   . History of cardiac catheterization    Normal coronary arteries July 2017  . Nonischemic cardiomyopathy (HCC)   . Obstructive sleep apnea   . Paroxysmal atrial flutter (HCC)    RFA 07/2016 - Dr. Johney Frame    Past Surgical History:  Procedure Laterality Date  . A-FLUTTER ABLATION N/A 07/26/2016   Procedure: A-Flutter Ablation;  Surgeon: Samantha Range, MD;  Location: [facility] INVASIVE CV LAB;  Service: Cardiovascular;  Laterality: N/A;  . ABDOMINAL HYSTERECTOMY    . CARDIAC CATHETERIZATION N/A 12/16/2015   Procedure: Left Heart Cath and Coronary Angiography;  Surgeon: Samantha Ouch, MD;  Location: [facility] INVASIVE CV LAB;  Service: Cardiovascular;  Laterality: N/A;  . CARDIOVERSION N/A 12/18/2015   Procedure: CARDIOVERSION;  Surgeon: Samantha Masson, MD;  Location: [facility] ENDOSCOPY;  Service: Cardiovascular;  Laterality: N/A;  . CHOLECYSTECTOMY    . TEE WITHOUT CARDIOVERSION N/A 12/18/2015   Procedure: TRANSESOPHAGEAL ECHOCARDIOGRAM (TEE);  Surgeon: Samantha Masson, MD;  Location: [facility] ENDOSCOPY;  Service: Cardiovascular;  Laterality: N/A;  . TUMOR REMOVAL   07/02/2015   ovarian    Current Outpatient Medications  Medication Sig Dispense Refill  . ARIPiprazole (ABILIFY) 2 MG tablet Take 1 tablet (2 mg total) by mouth daily. 90 tablet 0  . atorvastatin (LIPITOR) 40 MG tablet TAKE 1 TABLET BY MOUTH DAILY AT 6PM 90 tablet 1  . diazepam (VALIUM) 2 MG tablet Take 0.5 tablets (1 mg total) by mouth daily as needed for anxiety. 30 tablet 0  . venlafaxine XR (EFFEXOR-XR) 150 MG 24 hr capsule Take 1 capsule (150 mg total) by mouth daily with breakfast. 90 capsule 0  . VENTOLIN HFA 108 (90 Base) MCG/ACT inhaler Inhale 1-2 puffs into the lungs every 6 (six) hours as needed for shortness of breath or wheezing.  0   No current facility-administered medications for this visit.    Allergies:  Patient has no known allergies.   Social History: The patient  reports that she has never smoked. She has never used smokeless tobacco. She reports that she drinks alcohol. She reports that she does not use drugs.   ROS:  Please see the history of present illness. Otherwise, complete review of systems is positive for seasonal allergies.  All other systems are reviewed and negative.   Physical Exam: VS:  BP (!) 144/82   Pulse 80   Ht 5\' 2"  (1.575 m)   Wt 259 lb (117.5 kg)   BMI 47.37 kg/m , BMI Body mass index is 47.37 kg/m.  Wt Readings from Last 3 Encounters:  09/20/17 259 lb (  117.5 kg)  09/13/16 257 lb 6.4 oz (116.8 kg)  08/26/16 258 lb (117 kg)    General: Patient appears comfortable at rest. HEENT: Conjunctiva and lids normal, oropharynx clear. Neck: Supple, no elevated JVP or carotid bruits, no thyromegaly. Lungs: Diminished breath sounds without wheezing, nonlabored breathing at rest. Cardiac: Regular rate and rhythm, no S3 or significant systolic murmur. Abdomen: Soft, nontender, bowel sounds present. Extremities: No pitting edema, distal pulses 2+. Skin: Warm and dry. Musculoskeletal: No kyphosis. Neuropsychiatric: Alert and oriented x3, affect  grossly appropriate.  ECG: I personally reviewed the tracing from 08/26/2016 which showed normal sinus rhythm with low voltage and nonspecific ST-T changes.  Recent Labwork: No results found for requested labs within last 8760 hours.     Component Value Date/Time   CHOL 214 (H) 12/16/2015 0322   TRIG 399 (H) 12/16/2015 0322   HDL 25 (L) 12/16/2015 0322   CHOLHDL 8.6 12/16/2015 0322   VLDL 80 (H) 12/16/2015 0322   LDLCALC 109 (H) 12/16/2015 0322    Other Studies Reviewed Today:  Echocardiogram 03/23/2016: Study Conclusions  - Left ventricle: The cavity size was normal. Wall thickness was normal. The estimated ejection fraction was 55%. Wall motion was normal; there were no regional wall motion abnormalities. Left ventricular diastolic function parameters were normal. - Aortic valve: Mildly calcified annulus. Trileaflet. - Mitral valve: There was trivial regurgitation. - Left atrium: The atrium was at the upper limits of normal in size. - Right atrium: Central venous pressure (est): 3 mm Hg. - Tricuspid valve: There was trivial regurgitation. - Pulmonary arteries: Systolic pressure could not be accurately estimated. - Pericardium, extracardiac: There was no pericardial effusion.  Impressions:  - Normal LV wall thickness with LVEF approximately 55%. There has been significant improvement in LVEF compared to the study from July. Grossly normal diastolic function. Upper normal left atrial chamber size. Trivial mitral and tricuspid regurgitation.  Assessment and Plan:  1.  History of typical atrial flutter status post radiofrequency ablation by Dr. Johney Frame in February 2018.  She continues to do well, is no longer anticoagulated.  Her ECG shows normal sinus rhythm.  Continue with observation.  2.  Tachycardia-mediated cardiomyopathy with normalization of LVEF. She is asymptomatic at this point.  Current medicines were reviewed with the patient  today.   Orders Placed This Encounter  Procedures  . EKG 12-Lead    Disposition: Follow-up in one year.  Signed, Samantha Sidle, MD, [facility] 09/20/2017 2:20 PM    [facility] at [facility] [address] [place], [place], [place] [address] Phone: [phone]; Fax: [phone]

## 2017-09-20 ENCOUNTER — Encounter: Payer: Self-pay | Admitting: Cardiology

## 2017-09-20 ENCOUNTER — Ambulatory Visit (INDEPENDENT_AMBULATORY_CARE_PROVIDER_SITE_OTHER): Payer: BLUE CROSS/BLUE SHIELD | Admitting: Cardiology

## 2017-09-20 VITALS — BP 144/82 | HR 80 | Ht 62.0 in | Wt 259.0 lb

## 2017-09-20 DIAGNOSIS — Z8679 Personal history of other diseases of the circulatory system: Secondary | ICD-10-CM

## 2017-09-20 NOTE — Patient Instructions (Signed)

## 2017-10-06 ENCOUNTER — Other Ambulatory Visit (HOSPITAL_COMMUNITY): Payer: Self-pay | Admitting: Psychiatry

## 2017-10-06 ENCOUNTER — Other Ambulatory Visit: Payer: Self-pay | Admitting: *Deleted

## 2017-10-06 MED ORDER — ATORVASTATIN CALCIUM 40 MG PO TABS: ORAL_TABLET | ORAL | 1 refills | Status: DC

## 2017-10-06 MED ORDER — DIAZEPAM 2 MG PO TABS: 1.0000 mg | ORAL_TABLET | Freq: Every day | ORAL | 0 refills | Status: DC | PRN

## 2017-11-03 NOTE — Progress Notes (Signed)
BH MD/PA/NP OP Progress Note  11/08/2017 10:27 AM Samantha Turner  MRN:  [id number]  Chief Complaint:  Chief Complaint    Depression; Follow-up     HPI:  Patient presents for follow-up appointment for depression.  She states that she feels like she is in a deep hole, although she used to be doing better until a few weeks ago. She wonders if it get better at all. She has been feeling overwhelmed. Her husband recently had hernia surgery. She believes that grief for her deceased brother is becoming less intense.  She does not want to go outside as she feels like people are judging her. She also feels that her family, including her husband judge her of the way she clean the house or cook. She feels that her husband does not understand depression. She feels more comfortable with her sister as she also struggles with similar condition in the past. She reports good relationship with her grandchildren and make sure to meet their needs. She hopes to do more things and be there for her family. She feels exhausted, although she tries to make herself do things. She reports good sleep after limiting screen time. She feels fatigue, and has decreased concentration. She had passive SI, although she denies any currently. She has AH of mumbling voice. She denies VH. She had a few panic attacks while watching TV.   I have utilized the [place] Controlled Substances Reporting System (PMP AWARxE) to confirm adherence regarding the patient's medication. My review reveals appropriate prescription fills.   Wt Readings from Last 3 Encounters:  11/08/17 264 lb (119.7 kg)  09/20/17 259 lb (117.5 kg)  08/08/17 255 lb (115.7 kg)    Visit Diagnosis:    ICD-10-CM   1. Major depressive disorder, recurrent episode, moderate (HCC) F33.1     Past Psychiatric History: Please see initial evaluation for full details. I have reviewed the history. No updates at this time.     Past Medical History:  Past Medical History:  Diagnosis  Date  . Anxiety   . Bronchogenic cyst   . Depression   . Essential hypertension   . Headache   . History of cardiac catheterization    Normal coronary arteries July 2017  . Nonischemic cardiomyopathy (HCC)   . Obstructive sleep apnea   . Paroxysmal atrial flutter (HCC)    RFA 07/2016 - Dr. Johney Frame    Past Surgical History:  Procedure Laterality Date  . A-FLUTTER ABLATION N/A 07/26/2016   Procedure: A-Flutter Ablation;  Surgeon: Hillis Range, MD;  Location: [facility] INVASIVE CV LAB;  Service: Cardiovascular;  Laterality: N/A;  . ABDOMINAL HYSTERECTOMY    . CARDIAC CATHETERIZATION N/A 12/16/2015   Procedure: Left Heart Cath and Coronary Angiography;  Surgeon: Iran Ouch, MD;  Location: [facility] INVASIVE CV LAB;  Service: Cardiovascular;  Laterality: N/A;  . CARDIOVERSION N/A 12/18/2015   Procedure: CARDIOVERSION;  Surgeon: Lars Masson, MD;  Location: [facility] ENDOSCOPY;  Service: Cardiovascular;  Laterality: N/A;  . CHOLECYSTECTOMY    . TEE WITHOUT CARDIOVERSION N/A 12/18/2015   Procedure: TRANSESOPHAGEAL ECHOCARDIOGRAM (TEE);  Surgeon: Lars Masson, MD;  Location: [facility] ENDOSCOPY;  Service: Cardiovascular;  Laterality: N/A;  . TUMOR REMOVAL  07/02/2015   ovarian    Family Psychiatric History: Please see initial evaluation for full details. I have reviewed the history. No updates at this time.     Family History:  Family History  Problem Relation Age of Onset  . Heart failure Mother  Social History:  Social History   Socioeconomic History  . Marital status: Single    Spouse name: Not on file  . Number of children: Not on file  . Years of education: Not on file  . Highest education level: Not on file  Occupational History  . Not on file  Social Needs  . Financial resource strain: Not on file  . Food insecurity:    Worry: Not on file    Inability: Not on file  . Transportation needs:    Medical: Not on file    Non-medical: Not on file  Tobacco Use  . Smoking status:  Never Smoker  . Smokeless tobacco: Never Used  Substance and Sexual Activity  . Alcohol use: Yes    Comment: rarel  . Drug use: No  . Sexual activity: Yes    Birth control/protection: Surgical  Lifestyle  . Physical activity:    Days per week: Not on file    Minutes per session: Not on file  . Stress: Not on file  Relationships  . Social connections:    Talks on phone: Not on file    Gets together: Not on file    Attends religious service: Not on file    Active member of club or organization: Not on file    Attends meetings of clubs or organizations: Not on file    Relationship status: Not on file  Other Topics Concern  . Not on file  Social History Narrative  . Not on file    Allergies: No Known Allergies  Metabolic Disorder Labs: Lab Results  Component Value Date   HGBA1C 5.7 (H) 12/15/2015   MPG 117 12/15/2015   No results found for: PROLACTIN Lab Results  Component Value Date   CHOL 214 (H) 12/16/2015   TRIG 399 (H) 12/16/2015   HDL 25 (L) 12/16/2015   CHOLHDL 8.6 12/16/2015   VLDL 80 (H) 12/16/2015   LDLCALC 109 (H) 12/16/2015   Lab Results  Component Value Date   TSH 1.170 12/15/2015    Therapeutic Level Labs: No results found for: LITHIUM No results found for: VALPROATE No components found for:  CBMZ  Current Medications: Current Outpatient Medications  Medication Sig Dispense Refill  . ARIPiprazole (ABILIFY) 2 MG tablet Take 2.5 tablets (5 mg total) by mouth daily. 90 tablet 0  . atorvastatin (LIPITOR) 40 MG tablet TAKE 1 TABLET BY MOUTH DAILY AT 6PM 90 tablet 1  . [START ON 12/03/2017] diazepam (VALIUM) 2 MG tablet Take 0.5 tablets (1 mg total) by mouth daily as needed for anxiety. 15 tablet 2  . venlafaxine XR (EFFEXOR-XR) 150 MG 24 hr capsule Take 1 capsule (150 mg total) by mouth daily with breakfast. 90 capsule 0  . VENTOLIN HFA 108 (90 Base) MCG/ACT inhaler Inhale 1-2 puffs into the lungs every 6 (six) hours as needed for shortness of breath  or wheezing.  0   No current facility-administered medications for this visit.      Musculoskeletal: Strength & Muscle Tone: within normal limits Gait & Station: normal Patient leans: N/A  Psychiatric Specialty Exam: Review of Systems  Psychiatric/Behavioral: Positive for depression. Negative for hallucinations, memory loss, substance abuse and suicidal ideas. The patient is nervous/anxious. The patient does not have insomnia.   All other systems reviewed and are negative.   Blood pressure 133/80, pulse 83, height 5\' 2"  (1.575 m), weight 264 lb (119.7 kg), SpO2 96 %.Body mass index is 48.29 kg/m.  General Appearance: Fairly Groomed  Eye Contact:  Good  Speech:  Clear and Coherent  Volume:  Normal  Mood:  Depressed  Affect:  Appropriate, Congruent, Restricted and Tearful  Thought Process:  Coherent  Orientation:  Full (Time, Place, and Person)  Thought Content: Logical AH of mumbling voice, denies VH  Suicidal Thoughts:  No  Homicidal Thoughts:  No  Memory:  Immediate;   Good  Judgement:  Good  Insight:  Fair  Psychomotor Activity:  Normal  Concentration:  Concentration: Good and Attention Span: Good  Recall:  Good  Fund of Knowledge: Good  Language: Good  Akathisia:  No  Handed:  Right  AIMS (if indicated): not done  Assets:  Communication Skills Desire for Improvement  ADL's:  Intact  Cognition: WNL  Sleep:  Good   Screenings:   Assessment and Plan:  Samantha Turner is a 57 y.o. year old female with a history of depression,chronic systolic dysfunction with NYHA class II/III CHF, A flutter, OSA (she declined cpap machine) , who presents for follow up appointment for Major depressive disorder, recurrent episode, moderate (HCC)  Psychosocial stressors including her brother who committed suicide in July 2017 and her daughter with recent suicide attempt by overdosing cocaine.   # MDD, moderate, recurrent without psychotic features Patient reports recent relapse in  neurovegetative symptoms over the past few weeks. Will uptitrate Abilify to optimize its effect as adjunctive treatment for depression and for micro psychotic symptoms.  Will continue to monitor weight gain, although her weight gain is not correlated with starting Abilify.  Will continue Effexor for depression.  Will continue Valium as needed for anxiety.  Discussed risk of dependence and oversedation.  Discussed behavioral activation.  Discussed self compassion.    #insomnia Although she declines to use CPAP machine as it did not fit her, there has been improvement in insomnia after working on sleep hygiene. Will continue to monitor.    Plan I have reviewed and updated plans as below 1. Continue Effexor 150 mg daily- could not tolerate higher dose due to insomnia 2. Increase Abilify 5 mg daily  3. Continue Valium 1 mg daily as needed for anxiety  4. Return to clinic in threemonths for 15 mins  I have reviewed suicide assessment in detail. No change in the following assessment.   The patient demonstrates the following risk factors for suicide: Chronic risk factors for suicide include: psychiatric disorder of depressionand previous suicide attempts of having a gun. 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 Protective factorsfor this patient include: positive social support, coping skills and hope for the future. Considering these factors, the overall suicide risk at this point appears chronically elevated, but not at imminent danger to self. Patient isappropriate for outpatient follow up.Emergency resources which includes 911, ED, suicide crisis line [phone]) are discussed. Patient removed the gun from the house.   The duration of this appointment visit was 30 minutes of face-to-face time with the patient.  Greater than 50% of this time was spent in counseling, explanation of  diagnosis, planning  of further management, and coordination of care.  Neysa Hotter, MD 11/08/2017, 10:27 AM

## 2017-11-08 ENCOUNTER — Ambulatory Visit (INDEPENDENT_AMBULATORY_CARE_PROVIDER_SITE_OTHER): Payer: BLUE CROSS/BLUE SHIELD | Admitting: Psychiatry

## 2017-11-08 ENCOUNTER — Encounter (HOSPITAL_COMMUNITY): Payer: Self-pay | Admitting: Psychiatry

## 2017-11-08 VITALS — BP 133/80 | HR 83 | Ht 62.0 in | Wt 264.0 lb

## 2017-11-08 DIAGNOSIS — F331 Major depressive disorder, recurrent, moderate: Secondary | ICD-10-CM

## 2017-11-08 MED ORDER — ARIPIPRAZOLE 2 MG PO TABS: 5.0000 mg | ORAL_TABLET | Freq: Every day | ORAL | 0 refills | Status: DC

## 2017-11-08 MED ORDER — DIAZEPAM 2 MG PO TABS: 1.0000 mg | ORAL_TABLET | Freq: Every day | ORAL | 2 refills | Status: DC | PRN

## 2017-11-08 MED ORDER — VENLAFAXINE HCL ER 150 MG PO CP24: 150.0000 mg | ORAL_CAPSULE | Freq: Every day | ORAL | 0 refills | Status: DC

## 2017-11-08 NOTE — Patient Instructions (Signed)
1. Continue Effexor 150 mg daily- could not tolerate higher dose due to insomnia 2. Increase Abilify 5 mg daily  3. Continue Valium 1 mg daily as needed for anxiety  4. Return to clinic in threemonths for 15 mins

## 2018-02-06 NOTE — Progress Notes (Signed)
BH MD/PA/NP OP Progress Note  02/08/2018 11:07 AM Samantha Turner  MRN:  [id number]  Chief Complaint:  Chief Complaint    Depression; Follow-up     HPI:  Patient presents for follow-up appointment for depression.  She states that she has been feeling depressed.  She feels exhausted, and she missed to take baths at times.  She meets with her daughter regularly. She feels proud of her daughter, who has two jobs and has been abstinent from drug use. She reports good relationship with her husband, who encourages the patient to go outside.  She ran out of Abilify for the past few months.  She is advised to contact office if she were to run out of her medication.  She sleeps five hours, not feeling refreshed. She has fair concentration. She has passive SI, feeling that she is "no need to nobody." She makes sure that she does not have access to guns, and talks with her daughter. She feels anxious, tense and irritable. She would take valium two tabs twice a day (direction is 1/2 tabs daily). She denies HI. She has AH of mumbling voices. She denies VH. She believes that she was doing better when she was on Abilify. She is willing to see her therapist.  Wt Readings from Last 3 Encounters:  02/08/18 259 lb (117.5 kg)  11/08/17 264 lb (119.7 kg)  09/20/17 259 lb (117.5 kg)    Per PMP,  Diazepam last filled on 12/30/2017    Visit Diagnosis:    ICD-10-CM   1. Major depressive disorder, recurrent episode, moderate (HCC) F33.1     Past Psychiatric History: Please see initial evaluation for full details. I have reviewed the history. No updates at this time.     Past Medical History:  Past Medical History:  Diagnosis Date  . Anxiety   . Bronchogenic cyst   . Depression   . Essential hypertension   . Headache   . History of cardiac catheterization    Normal coronary arteries July 2017  . Nonischemic cardiomyopathy (HCC)   . Obstructive sleep apnea   . Paroxysmal atrial flutter (HCC)    RFA  07/2016 - Dr. Johney Frame    Past Surgical History:  Procedure Laterality Date  . A-FLUTTER ABLATION N/A 07/26/2016   Procedure: A-Flutter Ablation;  Surgeon: Hillis Range, MD;  Location: [facility] INVASIVE CV LAB;  Service: Cardiovascular;  Laterality: N/A;  . ABDOMINAL HYSTERECTOMY    . CARDIAC CATHETERIZATION N/A 12/16/2015   Procedure: Left Heart Cath and Coronary Angiography;  Surgeon: Iran Ouch, MD;  Location: [facility] INVASIVE CV LAB;  Service: Cardiovascular;  Laterality: N/A;  . CARDIOVERSION N/A 12/18/2015   Procedure: CARDIOVERSION;  Surgeon: Lars Masson, MD;  Location: [facility] ENDOSCOPY;  Service: Cardiovascular;  Laterality: N/A;  . CHOLECYSTECTOMY    . TEE WITHOUT CARDIOVERSION N/A 12/18/2015   Procedure: TRANSESOPHAGEAL ECHOCARDIOGRAM (TEE);  Surgeon: Lars Masson, MD;  Location: [facility] ENDOSCOPY;  Service: Cardiovascular;  Laterality: N/A;  . TUMOR REMOVAL  07/02/2015   ovarian    Family Psychiatric History: Please see initial evaluation for full details. I have reviewed the history. No updates at this time.     Family History:  Family History  Problem Relation Age of Onset  . Heart failure Mother     Social History:  Social History   Socioeconomic History  . Marital status: Single    Spouse name: Not on file  . Number of children: Not on file  . Years of education:  Not on file  . Highest education level: Not on file  Occupational History  . Not on file  Social Needs  . Financial resource strain: Not on file  . Food insecurity:    Worry: Not on file    Inability: Not on file  . Transportation needs:    Medical: Not on file    Non-medical: Not on file  Tobacco Use  . Smoking status: Never Smoker  . Smokeless tobacco: Never Used  Substance and Sexual Activity  . Alcohol use: Yes    Comment: rarel  . Drug use: No  . Sexual activity: Yes    Birth control/protection: Surgical  Lifestyle  . Physical activity:    Days per week: Not on file    Minutes per session:  Not on file  . Stress: Not on file  Relationships  . Social connections:    Talks on phone: Not on file    Gets together: Not on file    Attends religious service: Not on file    Active member of club or organization: Not on file    Attends meetings of clubs or organizations: Not on file    Relationship status: Not on file  Other Topics Concern  . Not on file  Social History Narrative  . Not on file    Allergies: No Known Allergies  Metabolic Disorder Labs: Lab Results  Component Value Date   HGBA1C 5.7 (H) 12/15/2015   MPG 117 12/15/2015   No results found for: PROLACTIN Lab Results  Component Value Date   CHOL 214 (H) 12/16/2015   TRIG 399 (H) 12/16/2015   HDL 25 (L) 12/16/2015   CHOLHDL 8.6 12/16/2015   VLDL 80 (H) 12/16/2015   LDLCALC 109 (H) 12/16/2015   Lab Results  Component Value Date   TSH 1.170 12/15/2015    Therapeutic Level Labs: No results found for: LITHIUM No results found for: VALPROATE No components found for:  CBMZ  Current Medications: Current Outpatient Medications  Medication Sig Dispense Refill  . atorvastatin (LIPITOR) 40 MG tablet TAKE 1 TABLET BY MOUTH DAILY AT 6PM 90 tablet 1  . diazepam (VALIUM) 2 MG tablet Take 0.5 tablets (1 mg total) by mouth daily as needed for anxiety. 15 tablet 2  . venlafaxine XR (EFFEXOR-XR) 150 MG 24 hr capsule Take 1 capsule (150 mg total) by mouth daily with breakfast. 90 capsule 0  . VENTOLIN HFA 108 (90 Base) MCG/ACT inhaler Inhale 1-2 puffs into the lungs every 6 (six) hours as needed for shortness of breath or wheezing.  0  . ARIPiprazole (ABILIFY) 5 MG tablet Take 1 tablet (5 mg total) by mouth daily. 90 tablet 0   No current facility-administered medications for this visit.      Musculoskeletal: Strength & Muscle Tone: within normal limits Gait & Station: normal Patient leans: N/A  Psychiatric Specialty Exam: Review of Systems  Psychiatric/Behavioral: Positive for depression and suicidal  ideas. Negative for hallucinations, memory loss and substance abuse. The patient is nervous/anxious and has insomnia.   All other systems reviewed and are negative.   Blood pressure 132/83, pulse 79, height 5\' 2"  (1.575 m), weight 259 lb (117.5 kg), SpO2 97 %.Body mass index is 47.37 kg/m.  General Appearance: Fairly Groomed  Eye Contact:  Good  Speech:  Clear and Coherent  Volume:  Normal  Mood:  Depressed  Affect:  Appropriate, Congruent, Restricted and down  Thought Process:  Coherent  Orientation:  Full (Time, Place, and Person)  Thought Content: Logical   Suicidal Thoughts:  Yes.  without intent/plan  Homicidal Thoughts:  No  Memory:  Immediate;   Good  Judgement:  Good  Insight:  Present  Psychomotor Activity:  Normal  Concentration:  Concentration: Good and Attention Span: Good  Recall:  Good  Fund of Knowledge: Good  Language: Good  Akathisia:  No  Handed:  Right  AIMS (if indicated): not done  Assets:  Communication Skills Desire for Improvement  ADL's:  Intact  Cognition: WNL  Sleep:  Fair   Screenings:   Assessment and Plan:  Rishita Petron is a 57 y.o. year old female with a history of depression, ,chronic systolic dysfunction with NYHA class II/III CHF, A flutter, OSA (she declined cpap machine) , who presents for follow up appointment for Major depressive disorder, recurrent episode, moderate (HCC)   Psychosocial stressors including her brother who committed suicide in July 2017 and her daughter with recent suicide attempt by overdosing cocaine.   # MDD, moderate, recurrent without psychotic features Patient continues to endorse neurovegetative symptoms in the context of running out of Abilify.  Will reinitiate Abilify as adjunctive treatment for depression.  Discussed metabolic side effect.  Will continue Effexor for depression.  Will continue Valium as needed for anxiety.  Discussed risk of dependence and oversedation.  Discussed behavioral activation.  She  will greatly benefit from CBT; make a referral.   Plan I have reviewed and updated plans as below 1. Continue Effexor 150 mg daily- could not tolerate higher dose due to insomnia 2. Reinitiate Abilify 5 mg daily  3. Continue Valium 1 mg daily as needed for anxiety 4. Return to clinic in threemonths for 15 mins - Referral to therapy Emergency resources which includes 911, ED, suicide crisis line [phone]) are discussed.   Past trials of medication: sertraline, Prozac, Paxil, Lexapro, Effexor, Wellbutrin (worsening SI/insomnia)Lamotrigine  I have reviewed suicide assessment in detail. No change in the following assessment.   The patient demonstrates the following risk factors for suicide: Chronic risk factors for suicide include: psychiatric disorder of depressionand previous suicide attempts of having a gun. 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 Protective factorsfor this patient include: positive social support, coping skills and hope for the future. Considering these factors, the overall suicide risk at this point appears chronically elevated, but not at imminent danger to self. Patient isappropriate for outpatient follow up.Emergency resources which includes 911, ED, suicide crisis line [phone]) are discussed. Patient removed the gun from the house.   Neysa Hotter, MD 02/08/2018, 11:07 AM

## 2018-02-08 ENCOUNTER — Encounter (HOSPITAL_COMMUNITY): Payer: Self-pay | Admitting: Psychiatry

## 2018-02-08 ENCOUNTER — Ambulatory Visit (INDEPENDENT_AMBULATORY_CARE_PROVIDER_SITE_OTHER): Payer: BLUE CROSS/BLUE SHIELD | Admitting: Psychiatry

## 2018-02-08 VITALS — BP 132/83 | HR 79 | Ht 62.0 in | Wt 259.0 lb

## 2018-02-08 DIAGNOSIS — F331 Major depressive disorder, recurrent, moderate: Secondary | ICD-10-CM | POA: Diagnosis not present

## 2018-02-08 MED ORDER — DIAZEPAM 2 MG PO TABS: 1.0000 mg | ORAL_TABLET | Freq: Every day | ORAL | 2 refills | Status: DC | PRN

## 2018-02-08 MED ORDER — VENLAFAXINE HCL ER 150 MG PO CP24: 150.0000 mg | ORAL_CAPSULE | Freq: Every day | ORAL | 0 refills | Status: DC

## 2018-02-08 MED ORDER — ARIPIPRAZOLE 5 MG PO TABS: 5.0000 mg | ORAL_TABLET | Freq: Every day | ORAL | 0 refills | Status: DC

## 2018-02-08 NOTE — Patient Instructions (Signed)
1. Continue Effexor 150 mg daily 2. Increase Abilify 5 mg daily  3. Continue Valium 1 mg daily as needed for anxiety 4. Return to clinic in threemonths for 15 mins 5. Referral to therapy  CONTACT INFORMATION  What to do if you need to get in touch with someone regarding a psychiatric issue:  1. EMERGENCY: For psychiatric emergencies (if you are suicidal or if there are any other safety issues) call 911 and/or go to your nearest Emergency Room immediately.   2. IF YOU NEED SOMEONE TO TALK TO RIGHT NOW: Given my clinical responsibilities, I may not be able to speak with you over the phone for a prolonged period of time.  a. You may always call The National Suicide Prevention Lifeline at 1-800-273-TALK (334)291-9485).  b. Your county of residence will also have local crisis services. For [facility]: [facility] at (252) 430-5999 (24 Hour Crisis Hotline)

## 2018-03-20 ENCOUNTER — Other Ambulatory Visit: Payer: Self-pay | Admitting: Cardiology

## 2018-04-30 ENCOUNTER — Other Ambulatory Visit (HOSPITAL_COMMUNITY): Payer: Self-pay | Admitting: Psychiatry

## 2018-04-30 MED ORDER — VENLAFAXINE HCL ER 150 MG PO CP24: 150.0000 mg | ORAL_CAPSULE | Freq: Every day | ORAL | 0 refills | Status: DC

## 2018-04-30 MED ORDER — ARIPIPRAZOLE 5 MG PO TABS: 5.0000 mg | ORAL_TABLET | Freq: Every day | ORAL | 0 refills | Status: DC

## 2018-05-09 ENCOUNTER — Ambulatory Visit (HOSPITAL_COMMUNITY): Payer: Self-pay | Admitting: Psychiatry

## 2018-05-10 ENCOUNTER — Ambulatory Visit (HOSPITAL_COMMUNITY): Payer: Self-pay | Admitting: Psychiatry

## 2018-07-02 ENCOUNTER — Encounter (HOSPITAL_COMMUNITY): Payer: Self-pay | Admitting: Psychiatry

## 2018-07-02 NOTE — Telephone Encounter (Signed)
This encounter was created in error - please disregard.

## 2018-07-19 ENCOUNTER — Other Ambulatory Visit (HOSPITAL_COMMUNITY): Payer: Self-pay | Admitting: Psychiatry

## 2018-07-19 MED ORDER — ARIPIPRAZOLE 5 MG PO TABS: 5.0000 mg | ORAL_TABLET | Freq: Every day | ORAL | 0 refills | Status: DC

## 2018-07-19 MED ORDER — VENLAFAXINE HCL ER 150 MG PO CP24: 150.0000 mg | ORAL_CAPSULE | Freq: Every day | ORAL | 0 refills | Status: DC

## 2018-07-19 NOTE — Telephone Encounter (Signed)
Ordered refill for abilify, venlafaxine per request. Please contact the patient to make follow up appointment. I will not be able to do any more refill without evaluation.

## 2018-09-18 ENCOUNTER — Telehealth: Payer: Self-pay | Admitting: *Deleted

## 2018-09-18 NOTE — Telephone Encounter (Signed)
Pt contacted per Dr Diona Browner. History reviewed. No symptoms to suggest any unstable cardiac conditions. Based on discussion, with current pandemic situation, we have postponed 09/21/18 appointment until July 2020. If symptoms change, pt has been instructed to contact our office

## 2018-09-21 ENCOUNTER — Ambulatory Visit: Payer: Self-pay | Admitting: Cardiology

## 2018-11-19 ENCOUNTER — Other Ambulatory Visit (HOSPITAL_COMMUNITY): Payer: Self-pay | Admitting: Psychiatry

## 2018-11-19 MED ORDER — ARIPIPRAZOLE 5 MG PO TABS: 5.0000 mg | ORAL_TABLET | Freq: Every day | ORAL | 0 refills | Status: DC

## 2018-11-19 MED ORDER — VENLAFAXINE HCL ER 150 MG PO CP24: 150.0000 mg | ORAL_CAPSULE | Freq: Every day | ORAL | 0 refills | Status: DC

## 2018-12-03 NOTE — Progress Notes (Signed)
Virtual Visit via Telephone Note  I connected with Samantha Turner on 12/10/18 at  3:00 PM EDT by telephone and verified that I am speaking with the correct person using two identifiers.   I discussed the limitations, risks, security and privacy concerns of performing an evaluation and management service by telephone and the availability of in person appointments. I also discussed with the patient that there may be a patient responsible charge related to this service. The patient expressed understanding and agreed to proceed.   I discussed the assessment and treatment plan with the patient. The patient was provided an opportunity to ask questions and all were answered. The patient agreed with the plan and demonstrated an understanding of the instructions.   The patient was advised to call back or seek an in-person evaluation if the symptoms worsen or if the condition fails to improve as anticipated.  I provided 15 minutes of non-face-to-face time during this encounter.   Neysa Hotter, MD    [facility] MD/PA/NP OP Progress Note  12/10/2018 3:24 PM Samantha Turner  MRN:  [id number]  Chief Complaint:  Chief Complaint    Depression; Follow-up     HPI:  - She is not seen since 02/2018 This is a follow-up appointment for depression.  She states that she has been "doing pretty good." She finds reinitiating abilify to be very helpful.  She has been more things during the day; she enjoys washing her car as she used to, and getting outside more frequently. She enjoys seeing her grandchildren (age 53,14). Although she still feels that the relationship with her daughters have been strained at times, it has been improving. Her daughter is doing well without using any drugs.  Although she may feel sad on the anniversary of her brother's birthday, she has been able to handle it better.  She reports good relationship with her husband, who has been supportive to the patient.  She sleeps better.  She has more energy.   She denies anhedonia.  She has good concentration.  She denies SI.  She occasionally feels anxious; she takes Valium 4-5 times per month.  She denies panic attacks. She denies nightmares, flashback, or hypervigilance.     Visit Diagnosis:    ICD-10-CM   1. MDD (major depressive disorder), recurrent, in partial remission (HCC)  F33.41     Past Psychiatric History: Please see initial evaluation for full details. I have reviewed the history. No updates at this time.     Past Medical History:  Past Medical History:  Diagnosis Date  . Anxiety   . Bronchogenic cyst   . Depression   . Essential hypertension   . Headache   . History of cardiac catheterization    Normal coronary arteries July 2017  . Nonischemic cardiomyopathy (HCC)   . Obstructive sleep apnea   . Paroxysmal atrial flutter (HCC)    RFA 07/2016 - Dr. Johney Frame    Past Surgical History:  Procedure Laterality Date  . A-FLUTTER ABLATION N/A 07/26/2016   Procedure: A-Flutter Ablation;  Surgeon: Hillis Range, MD;  Location: [facility] INVASIVE CV LAB;  Service: Cardiovascular;  Laterality: N/A;  . ABDOMINAL HYSTERECTOMY    . CARDIAC CATHETERIZATION N/A 12/16/2015   Procedure: Left Heart Cath and Coronary Angiography;  Surgeon: Iran Ouch, MD;  Location: [facility] INVASIVE CV LAB;  Service: Cardiovascular;  Laterality: N/A;  . CARDIOVERSION N/A 12/18/2015   Procedure: CARDIOVERSION;  Surgeon: Lars Masson, MD;  Location: [facility] ENDOSCOPY;  Service: Cardiovascular;  Laterality: N/A;  .  CHOLECYSTECTOMY    . TEE WITHOUT CARDIOVERSION N/A 12/18/2015   Procedure: TRANSESOPHAGEAL ECHOCARDIOGRAM (TEE);  Surgeon: Lars MassonKatarina H Nelson, MD;  [facility];  Service: Cardiovascular;  Laterality: N/A;  . TUMOR REMOVAL  07/02/2015   ovarian    Family Psychiatric History: Please see initial evaluation for full details. I have reviewed the history. No updates at this time.     Family History:  Family History  Problem Relation Age of Onset   . Heart failure Mother     Social History:  Social History   Socioeconomic History  . Marital status: Single    Spouse name: Not on file  . Number of children: Not on file  . Years of education: Not on file  . Highest education level: Not on file  Occupational History  . Not on file  Social Needs  . Financial resource strain: Not on file  . Food insecurity    Worry: Not on file    Inability: Not on file  . Transportation needs    Medical: Not on file    Non-medical: Not on file  Tobacco Use  . Smoking status: Never Smoker  . Smokeless tobacco: Never Used  Substance and Sexual Activity  . Alcohol use: Yes    Comment: rarel  . Drug use: No  . Sexual activity: Yes    Birth control/protection: Surgical  Lifestyle  . Physical activity    Days per week: Not on file    Minutes per session: Not on file  . Stress: Not on file  Relationships  . Social Musicianconnections    Talks on phone: Not on file    Gets together: Not on file    Attends religious service: Not on file    Active member of club or organization: Not on file    Attends meetings of clubs or organizations: Not on file    Relationship status: Not on file  Other Topics Concern  . Not on file  Social History Narrative  . Not on file    Allergies: No Known Allergies  Metabolic Disorder Labs: Lab Results  Component Value Date   HGBA1C 5.7 (H) 12/15/2015   MPG 117 12/15/2015   No results found for: PROLACTIN Lab Results  Component Value Date   CHOL 214 (H) 12/16/2015   TRIG 399 (H) 12/16/2015   HDL 25 (L) 12/16/2015   CHOLHDL 8.6 12/16/2015   VLDL 80 (H) 12/16/2015   LDLCALC 109 (H) 12/16/2015   Lab Results  Component Value Date   TSH 1.170 12/15/2015    Therapeutic Level Labs: No results found for: LITHIUM No results found for: VALPROATE No components found for:  CBMZ  Current Medications: Current Outpatient Medications  Medication Sig Dispense Refill  . [START ON 02/19/2019] ARIPiprazole  (ABILIFY) 5 MG tablet Take 1 tablet (5 mg total) by mouth daily. 90 tablet 0  . atorvastatin (LIPITOR) 40 MG tablet TAKE 1 TABLET BY MOUTH DAILY AT 6PM 90 tablet 2  . diazepam (VALIUM) 2 MG tablet Take 0.5 tablets (1 mg total) by mouth daily as needed for anxiety. 15 tablet 3  . [START ON 02/19/2019] venlafaxine XR (EFFEXOR-XR) 150 MG 24 hr capsule Take 1 capsule (150 mg total) by mouth daily with breakfast. 90 capsule 0  . VENTOLIN HFA 108 (90 Base) MCG/ACT inhaler Inhale 1-2 puffs into the lungs every 6 (six) hours as needed for shortness of breath or wheezing.  0   No current facility-administered medications for this visit.  Musculoskeletal: Strength & Muscle Tone: N/A Gait & Station: N/A Patient leans: N/A  Psychiatric Specialty Exam: Review of Systems  Psychiatric/Behavioral: Negative for depression, hallucinations, memory loss, substance abuse and suicidal ideas. The patient is nervous/anxious. The patient does not have insomnia.   All other systems reviewed and are negative.   There were no vitals taken for this visit.There is no height or weight on file to calculate BMI.  General Appearance: NA  Eye Contact:  NA  Speech:  Clear and Coherent  Volume:  Normal  Mood:  "good  Affect:  NA  Thought Process:  Coherent  Orientation:  Full (Time, Place, and Person)  Thought Content: Logical   Suicidal Thoughts:  No  Homicidal Thoughts:  No  Memory:  Immediate;   Good  Judgement:  Good  Insight:  Fair  Psychomotor Activity:  Normal  Concentration:  Concentration: Good and Attention Span: Good  Recall:  Good  Fund of Knowledge: Good  Language: Good  Akathisia:  No  Handed:  Right  AIMS (if indicated): not done  Assets:  Communication Skills Desire for Improvement  ADL's:  Intact  Cognition: WNL  Sleep:  Fair   Screenings:   Assessment and Plan:  Samantha Turner is a 58 y.o. year old female with a history of depression,chronic systolic dysfunction with NYHA class  II/III CHF, A flutter, OSA (she declined cpap machine) , who presents for follow up appointment for depression.   # MDD in partial remission, recurrent  There has been significant improvement in depressive symptoms after reinitiating Abilify.  Will continue venlafaxine to target depression.  Will continue Abilify as adjunctive treatment for depression.  Discussed potential metabolic side effect.  Valium as needed for anxiety.  Discussed risk of dependence and oversedation.  Discussed behavioral activation.   Plan I have reviewed and updated plans as below 1. Continue Effexor 150 mg daily- could not tolerate higher dose due to insomnia 2. Continue Abilify 5 mg daily  3.Continue Valium 1 mg daily as needed for anxiety 4. Next appointment: 11/5 at 1 PM for 20 mins, phone  Past trials of medication: sertraline, Prozac, Paxil, Lexapro, Effexor, Wellbutrin (worsening SI/insomnia)Lamotrigine  I have reviewed suicide assessment in detail. Change in the following assessment as below.   The patient demonstrates the following risk factors for suicide: Chronic risk factors for suicide include: psychiatric disorder of depressionand previous suicide attempts of having a gun. 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 Protective factorsfor this patient include: positive social support, coping skills and hope for the future. Considering these factors, the overall suicide risk at this point appears low. Patient isappropriate for outpatient follow up   Norman Clay, MD 12/10/2018, 3:24 PM

## 2018-12-10 ENCOUNTER — Ambulatory Visit (INDEPENDENT_AMBULATORY_CARE_PROVIDER_SITE_OTHER): Payer: BC Managed Care – PPO | Admitting: Psychiatry

## 2018-12-10 ENCOUNTER — Encounter (HOSPITAL_COMMUNITY): Payer: Self-pay | Admitting: Psychiatry

## 2018-12-10 ENCOUNTER — Other Ambulatory Visit: Payer: Self-pay

## 2018-12-10 DIAGNOSIS — F3341 Major depressive disorder, recurrent, in partial remission: Secondary | ICD-10-CM

## 2018-12-10 MED ORDER — VENLAFAXINE HCL ER 150 MG PO CP24: 150.0000 mg | ORAL_CAPSULE | Freq: Every day | ORAL | 0 refills | Status: DC

## 2018-12-10 MED ORDER — DIAZEPAM 2 MG PO TABS: 1.0000 mg | ORAL_TABLET | Freq: Every day | ORAL | 3 refills | Status: DC | PRN

## 2018-12-10 MED ORDER — ARIPIPRAZOLE 5 MG PO TABS: 5.0000 mg | ORAL_TABLET | Freq: Every day | ORAL | 0 refills | Status: DC

## 2018-12-10 NOTE — Patient Instructions (Signed)
1. Continue Effexor 150 mg daily 2.ContinueAbilify 5 mg daily  3.Continue Valium 1 mg daily as needed for anxiety 4. Next appointment: 11/5 at 1 PM

## 2018-12-24 ENCOUNTER — Other Ambulatory Visit: Payer: Self-pay

## 2018-12-24 ENCOUNTER — Ambulatory Visit: Payer: BC Managed Care – PPO | Admitting: Cardiology

## 2018-12-24 ENCOUNTER — Encounter: Payer: Self-pay | Admitting: Cardiology

## 2018-12-24 VITALS — BP 128/82 | HR 81 | Temp 97.6°F | Ht 63.0 in | Wt 257.0 lb

## 2018-12-24 DIAGNOSIS — Z8679 Personal history of other diseases of the circulatory system: Secondary | ICD-10-CM

## 2018-12-24 NOTE — Progress Notes (Signed)
Cardiology Office Note  Date: 12/24/2018   ID: Samantha Turner, DOB [birth year], MRN [id number]  PCP:  Kirstie Peri, MD  Cardiologist:  Nona Dell, MD Electrophysiologist:  None   Chief Complaint  Patient presents with  . Cardiac follow-up    History of Present Illness: Samantha Turner is a 58 y.o. female last seen in April 2019.  She presents for a routine visit.  She does not report any progressive sense of palpitations, shortness of breath, or exertional chest pain.  She continues to follow at [facility], physical planned in December.  I reviewed her medications which are outlined below.  I personally reviewed her ECG today which shows normal sinus rhythm with low voltage.  Past Medical History:  Diagnosis Date  . Anxiety   . Bronchogenic cyst   . Depression   . Essential hypertension   . Headache   . History of cardiac catheterization    Normal coronary arteries July 2017  . Nonischemic cardiomyopathy (HCC)   . Obstructive sleep apnea   . Paroxysmal atrial flutter (HCC)    RFA 07/2016 - Dr. Johney Frame    Past Surgical History:  Procedure Laterality Date  . A-FLUTTER ABLATION N/A 07/26/2016   Procedure: A-Flutter Ablation;  Surgeon: Hillis Range, MD;  Location: [facility] INVASIVE CV LAB;  Service: Cardiovascular;  Laterality: N/A;  . ABDOMINAL HYSTERECTOMY    . CARDIAC CATHETERIZATION N/A 12/16/2015   Procedure: Left Heart Cath and Coronary Angiography;  Surgeon: Iran Ouch, MD;  Location: [facility] INVASIVE CV LAB;  Service: Cardiovascular;  Laterality: N/A;  . CARDIOVERSION N/A 12/18/2015   Procedure: CARDIOVERSION;  Surgeon: Lars Masson, MD;  Location: [facility] ENDOSCOPY;  Service: Cardiovascular;  Laterality: N/A;  . CHOLECYSTECTOMY    . TEE WITHOUT CARDIOVERSION N/A 12/18/2015   Procedure: TRANSESOPHAGEAL ECHOCARDIOGRAM (TEE);  Surgeon: Lars Masson, MD;  Location: [facility] ENDOSCOPY;  Service: Cardiovascular;  Laterality: N/A;  . TUMOR REMOVAL  07/02/2015   ovarian    Current Outpatient Medications  Medication Sig Dispense Refill  . [START ON 02/19/2019] ARIPiprazole (ABILIFY) 5 MG tablet Take 1 tablet (5 mg total) by mouth daily. 90 tablet 0  . atorvastatin (LIPITOR) 40 MG tablet TAKE 1 TABLET BY MOUTH DAILY AT 6PM 90 tablet 2  . diazepam (VALIUM) 2 MG tablet Take 0.5 tablets (1 mg total) by mouth daily as needed for anxiety. 15 tablet 3  . [START ON 02/19/2019] venlafaxine XR (EFFEXOR-XR) 150 MG 24 hr capsule Take 1 capsule (150 mg total) by mouth daily with breakfast. 90 capsule 0  . VENTOLIN HFA 108 (90 Base) MCG/ACT inhaler Inhale 1-2 puffs into the lungs every 6 (six) hours as needed for shortness of breath or wheezing.  0   No current facility-administered medications for this visit.    Allergies:  Patient has no known allergies.   Social History: The patient  reports that she has never smoked. She has never used smokeless tobacco. She reports current alcohol use. She reports that she does not use drugs.   ROS:  Please see the history of present illness. Otherwise, complete review of systems is positive for none.  All other systems are reviewed and negative.   Physical Exam: VS:  BP 128/82   Pulse 81   Temp 97.6 F (36.4 C)   Ht 5\' 3"  (1.6 m)   Wt 257 lb (116.6 kg)   SpO2 98%   BMI 45.53 kg/m , BMI Body mass index is 45.53 kg/m.  Wt Readings  from Last 3 Encounters:  12/24/18 257 lb (116.6 kg)  09/20/17 259 lb (117.5 kg)  09/13/16 257 lb 6.4 oz (116.8 kg)    General: Patient appears comfortable at rest. HEENT: Conjunctiva and lids normal, wearing a mask. Neck: Supple, no elevated JVP or carotid bruits, no thyromegaly. Lungs: Clear to auscultation, nonlabored breathing at rest. Cardiac: Decreased breath sounds, no S3 or significant systolic murmur, no pericardial rub. Abdomen: Soft, nontender, bowel sounds present. Extremities: No pitting edema, distal pulses 2+. Skin: Warm and dry. Musculoskeletal: No kyphosis.  Neuropsychiatric: Alert and oriented x3, affect grossly appropriate.  ECG:  An ECG dated 09/20/2017 was personally reviewed today and demonstrated:  Sinus rhythm with low voltage.  Recent Labwork:  No recent lab work for review.  Other Studies Reviewed Today:  Echocardiogram 03/23/2016: Study Conclusions  - Left ventricle: The cavity size was normal. Wall thickness was normal. The estimated ejection fraction was 55%. Wall motion was normal; there were no regional wall motion abnormalities. Left ventricular diastolic function parameters were normal. - Aortic valve: Mildly calcified annulus. Trileaflet. - Mitral valve: There was trivial regurgitation. - Left atrium: The atrium was at the upper limits of normal in size. - Right atrium: Central venous pressure (est): 3 mm Hg. - Tricuspid valve: There was trivial regurgitation. - Pulmonary arteries: Systolic pressure could not be accurately estimated. - Pericardium, extracardiac: There was no pericardial effusion.  Impressions:  - Normal LV wall thickness with LVEF approximately 55%. There has been significant improvement in LVEF compared to the study from July. Grossly normal diastolic function. Upper normal left atrial chamber size. Trivial mitral and tricuspid regurgitation.  Assessment and Plan:  1.  History of typical atrial flutter status post radiofrequency ablation by Dr. Rayann Heman in February 2018.  She does not report any progressive sense of palpitations and is in sinus rhythm by ECG today.  Continue observation.  2.  History of tachycardia mediated cardiomyopathy with normalization of LVEF.  Medication Adjustments/Labs and Tests Ordered: Current medicines are reviewed at length with the patient today.  Concerns regarding medicines are outlined above.   Tests Ordered: Orders Placed This Encounter  Procedures  . EKG 12-Lead    Medication Changes: No orders of the defined types were placed in this  encounter.   Disposition:  Follow up 1 year in the [place] office.  Signed, Satira Sark, MD, [facility] 12/24/2018 11:27 AM    [facility] [place], [place], [place] [address] Phone: [phone]; Fax: [phone]

## 2018-12-24 NOTE — Patient Instructions (Signed)

## 2019-04-03 NOTE — Progress Notes (Signed)
Virtual Visit via Telephone Note  I connected with Samantha Turner on 04/11/19 at  1:00 PM EST by telephone and verified that I am speaking with the correct person using two identifiers.   I discussed the limitations, risks, security and privacy concerns of performing an evaluation and management service by telephone and the availability of in person appointments. I also discussed with the patient that there may be a patient responsible charge related to this service. The patient expressed understanding and agreed to proceed.       I discussed the assessment and treatment plan with the patient. The patient was provided an opportunity to ask questions and all were answered. The patient agreed with the plan and demonstrated an understanding of the instructions.   The patient was advised to call back or seek an in-person evaluation if the symptoms worsen or if the condition fails to improve as anticipated.  I provided 12 minutes of non-face-to-face time during this encounter.   Norman Clay, MD    [facility] MD/PA/NP OP Progress Note  04/11/2019 1:22 PM Samantha Turner  MRN:  [id number]  Chief Complaint:  Chief Complaint    Follow-up; Anxiety; Depression     HPI:  This is a follow-up appointment for depression.  She states that she has been doing very well.  She enjoys being with her grandchildren at home.  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 Her husband has been doing good. Although she feels down and anxious at times, thinking about pandemic and children's school, she believes she has been handling things well.  She denies insomnia.  She has good motivation and energy.  She enjoys making Christmas depression.  She has good concentration. She has good appetite. She feels good about recent weight loss.  She denies SI.  She denies panic attacks.  She takes Valium once a week or less.   244 lbs Wt Readings from Last 3 Encounters:  12/24/18 257 lb (116.6 kg)  02/08/18 259 lb (117.5 kg)   11/08/17 264 lb (119.7 kg)     Visit Diagnosis:    ICD-10-CM   1. MDD (major depressive disorder), recurrent, in partial remission ([place])  F33.41     Past Psychiatric History: Please see initial evaluation for full details. I have reviewed the history. No updates at this time.     Past Medical History:  Past Medical History:  Diagnosis Date  . Anxiety   . Bronchogenic cyst   . Depression   . Essential hypertension   . Headache   . History of cardiac catheterization    Normal coronary arteries July 2017  . Nonischemic cardiomyopathy ([place])   . Obstructive sleep apnea   . Paroxysmal atrial flutter ([place])    RFA 07/2016 - Dr. Rayann Heman    Past Surgical History:  Procedure Laterality Date  . A-FLUTTER ABLATION N/A 07/26/2016   Procedure: A-Flutter Ablation;  Surgeon: Thompson Grayer, MD;  Location: [place] CV LAB;  Service: Cardiovascular;  Laterality: N/A;  . ABDOMINAL HYSTERECTOMY    . CARDIAC CATHETERIZATION N/A 12/16/2015   Procedure: Left Heart Cath and Coronary Angiography;  Surgeon: Wellington Hampshire, MD;  Location: [place] CV LAB;  Service: Cardiovascular;  Laterality: N/A;  . CARDIOVERSION N/A 12/18/2015   Procedure: CARDIOVERSION;  Surgeon: Dorothy Spark, MD;  Location: [place];  Service: Cardiovascular;  Laterality: N/A;  . CHOLECYSTECTOMY    . TEE WITHOUT CARDIOVERSION N/A 12/18/2015   Procedure: TRANSESOPHAGEAL ECHOCARDIOGRAM (TEE);  Surgeon: Dorothy Spark,  MD;  Location: [facility] ENDOSCOPY;  Service: Cardiovascular;  Laterality: N/A;  . TUMOR REMOVAL  07/02/2015   ovarian    Family Psychiatric History: Please see initial evaluation for full details. I have reviewed the history. No updates at this time.     Family History:  Family History  Problem Relation Age of Onset  . Heart failure Mother     Social History:  Social History   Socioeconomic History  . Marital status: Single    Spouse name: Not on file  . Number of children: Not on file  .  Years of education: Not on file  . Highest education level: Not on file  Occupational History  . Not on file  Social Needs  . Financial resource strain: Not on file  . Food insecurity    Worry: Not on file    Inability: Not on file  . Transportation needs    Medical: Not on file    Non-medical: Not on file  Tobacco Use  . Smoking status: Never Smoker  . Smokeless tobacco: Never Used  Substance and Sexual Activity  . Alcohol use: Yes    Comment: rarel  . Drug use: No  . Sexual activity: Yes    Birth control/protection: Surgical  Lifestyle  . Physical activity    Days per week: Not on file    Minutes per session: Not on file  . Stress: Not on file  Relationships  . Social Musician on phone: Not on file    Gets together: Not on file    Attends religious service: Not on file    Active member of club or organization: Not on file    Attends meetings of clubs or organizations: Not on file    Relationship status: Not on file  Other Topics Concern  . Not on file  Social History Narrative  . Not on file    Allergies: No Known Allergies  Metabolic Disorder Labs: Lab Results  Component Value Date   HGBA1C 5.7 (H) 12/15/2015   MPG 117 12/15/2015   No results found for: PROLACTIN Lab Results  Component Value Date   CHOL 214 (H) 12/16/2015   TRIG 399 (H) 12/16/2015   HDL 25 (L) 12/16/2015   CHOLHDL 8.6 12/16/2015   VLDL 80 (H) 12/16/2015   LDLCALC 109 (H) 12/16/2015   Lab Results  Component Value Date   TSH 1.170 12/15/2015    Therapeutic Level Labs: No results found for: LITHIUM No results found for: VALPROATE No components found for:  CBMZ  Current Medications: Current Outpatient Medications  Medication Sig Dispense Refill  . [START ON 05/20/2019] ARIPiprazole (ABILIFY) 5 MG tablet Take 1 tablet (5 mg total) by mouth daily. 90 tablet 1  . atorvastatin (LIPITOR) 40 MG tablet TAKE 1 TABLET BY MOUTH DAILY AT 6PM 90 tablet 2  . diazepam (VALIUM) 2  MG tablet Take 0.5 tablets (1 mg total) by mouth daily as needed for anxiety. 15 tablet 3  . [START ON 05/20/2019] venlafaxine XR (EFFEXOR-XR) 150 MG 24 hr capsule Take 1 capsule (150 mg total) by mouth daily with breakfast. 90 capsule 1  . VENTOLIN HFA 108 (90 Base) MCG/ACT inhaler Inhale 1-2 puffs into the lungs every 6 (six) hours as needed for shortness of breath or wheezing.  0   No current facility-administered medications for this visit.      Musculoskeletal: Strength & Muscle Tone: N/A Gait & Station: N/A Patient leans: N/A  Psychiatric Specialty Exam:  Review of Systems  Psychiatric/Behavioral: Negative for depression, hallucinations, memory loss, substance abuse and suicidal ideas. The patient is nervous/anxious. The patient does not have insomnia.   All other systems reviewed and are negative.   There were no vitals taken for this visit.There is no height or weight on file to calculate BMI.  General Appearance: NA  Eye Contact:  NA  Speech:  Clear and Coherent  Volume:  Normal  Mood:  "better"  Affect:  NA  Thought Process:  Coherent  Orientation:  Full (Time, Place, and Person)  Thought Content: Logical   Suicidal Thoughts:  No  Homicidal Thoughts:  No  Memory:  Immediate;   Good  Judgement:  Good  Insight:  Good  Psychomotor Activity:  Normal  Concentration:  Concentration: Good and Attention Span: Good  Recall:  Good  Fund of Knowledge: Good  Language: Good  Akathisia:  No  Handed:  Right  AIMS (if indicated): not done  Assets:  Communication Skills Desire for Improvement  ADL's:  Intact  Cognition: WNL  Sleep:  Good   Screenings:   Assessment and Plan:  Korinne Greenstein is a 58 y.o. year old female with a history of depression, chronic systolic dysfunction with NYHA class II/III CHF, A flutter, OSA (she declined cpap machine), who presents for follow up appointment for MDD (major depressive disorder), recurrent, in partial remission (HCC)  # MDD in  partial remission, recurrent There has been steady improvement in depressive symptoms after reinitiating Abilify.  We will continue venlafaxine to target depression.  We will continue Abilify as adjunctive treatment for depression.  Discussed potential metabolic side effect.  We will continue Valium as needed for anxiety.  Discussed risk of dependence and oversedation.  Discussed behavioral activation.   Plan I have reviewed and updated plans as below 1. Continue Effexor 150 mg daily- could not tolerate higher dose due to insomnia 2.ContinueAbilify 5 mg daily (3 refill order left) 3.Continue Valium 1 mg daily as needed for anxiety 4. Next appointment: 2/25 at 1 PM for 20 mins, phone  Past trials of medication: sertraline, Prozac, Paxil, Lexapro, Effexor, Wellbutrin (worsening SI/insomnia)Lamotrigine   The patient demonstrates the following risk factors for suicide: Chronic risk factors for suicide include: psychiatric disorder of depressionand previous suicide attempts of having a gun. 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 Protective factorsfor this patient include: positive social support, coping skills and hope for the future. Considering these factors, the overall suicide risk at this point appears low. Patient isappropriate for outpatient follow up   Neysa Hotter, MD 04/11/2019, 1:22 PM

## 2019-04-11 ENCOUNTER — Encounter (HOSPITAL_COMMUNITY): Payer: Self-pay | Admitting: Psychiatry

## 2019-04-11 ENCOUNTER — Ambulatory Visit (INDEPENDENT_AMBULATORY_CARE_PROVIDER_SITE_OTHER): Payer: BC Managed Care – PPO | Admitting: Psychiatry

## 2019-04-11 ENCOUNTER — Other Ambulatory Visit: Payer: Self-pay

## 2019-04-11 DIAGNOSIS — F3341 Major depressive disorder, recurrent, in partial remission: Secondary | ICD-10-CM | POA: Diagnosis not present

## 2019-04-11 MED ORDER — VENLAFAXINE HCL ER 150 MG PO CP24: 150.0000 mg | ORAL_CAPSULE | Freq: Every day | ORAL | 1 refills | Status: DC

## 2019-04-11 MED ORDER — ARIPIPRAZOLE 5 MG PO TABS: 5.0000 mg | ORAL_TABLET | Freq: Every day | ORAL | 1 refills | Status: DC

## 2019-04-11 NOTE — Patient Instructions (Signed)
1. Continue Effexor 150 mg daily 2.ContinueAbilify 5 mg daily 3.Continue Valium 1 mg daily as needed for anxiety 4.Next appointment: 2/25 at 1 PM

## 2019-07-30 NOTE — Progress Notes (Deleted)
[facility] MD/PA/NP OP Progress Note  07/30/2019 10:56 AM Samantha Turner  MRN:  [id number]  Chief Complaint:  HPI: *** Visit Diagnosis: No diagnosis found.  Past Psychiatric History: Please see initial evaluation for full details. I have reviewed the history. No updates at this time.     Past Medical History:  Past Medical History:  Diagnosis Date  . Anxiety   . Bronchogenic cyst   . Depression   . Essential hypertension   . Headache   . History of cardiac catheterization    Normal coronary arteries July 2017  . Nonischemic cardiomyopathy (HCC)   . Obstructive sleep apnea   . Paroxysmal atrial flutter (HCC)    RFA 07/2016 - Dr. Johney Frame    Past Surgical History:  Procedure Laterality Date  . A-FLUTTER ABLATION N/A 07/26/2016   Procedure: A-Flutter Ablation;  Surgeon: Hillis Range, MD;  Location: [facility] INVASIVE CV LAB;  Service: Cardiovascular;  Laterality: N/A;  . ABDOMINAL HYSTERECTOMY    . CARDIAC CATHETERIZATION N/A 12/16/2015   Procedure: Left Heart Cath and Coronary Angiography;  Surgeon: Iran Ouch, MD;  Location: [facility] INVASIVE CV LAB;  Service: Cardiovascular;  Laterality: N/A;  . CARDIOVERSION N/A 12/18/2015   Procedure: CARDIOVERSION;  Surgeon: Lars Masson, MD;  Location: [facility] ENDOSCOPY;  Service: Cardiovascular;  Laterality: N/A;  . CHOLECYSTECTOMY    . TEE WITHOUT CARDIOVERSION N/A 12/18/2015   Procedure: TRANSESOPHAGEAL ECHOCARDIOGRAM (TEE);  Surgeon: Lars Masson, MD;  Location: [facility] ENDOSCOPY;  Service: Cardiovascular;  Laterality: N/A;  . TUMOR REMOVAL  07/02/2015   ovarian    Family Psychiatric History: Please see initial evaluation for full details. I have reviewed the history. No updates at this time.     Family History:  Family History  Problem Relation Age of Onset  . Heart failure Mother     Social History:  Social History   Socioeconomic History  . Marital status: Single    Spouse name: Not on file  . Number of children: Not on file  . Years of  education: Not on file  . Highest education level: Not on file  Occupational History  . Not on file  Tobacco Use  . Smoking status: Never Smoker  . Smokeless tobacco: Never Used  Substance and Sexual Activity  . Alcohol use: Yes    Comment: rarel  . Drug use: No  . Sexual activity: Yes    Birth control/protection: Surgical  Other Topics Concern  . Not on file  Social History Narrative  . Not on file   Social Determinants of Health   Financial Resource Strain:   . Difficulty of Paying Living Expenses: Not on file  Food Insecurity:   . Worried About Programme researcher, broadcasting/film/video in the Last Year: Not on file  . Ran Out of Food in the Last Year: Not on file  Transportation Needs:   . Lack of Transportation (Medical): Not on file  . Lack of Transportation (Non-Medical): Not on file  Physical Activity:   . Days of Exercise per Week: Not on file  . Minutes of Exercise per Session: Not on file  Stress:   . Feeling of Stress : Not on file  Social Connections:   . Frequency of Communication with Friends and Family: Not on file  . Frequency of Social Gatherings with Friends and Family: Not on file  . Attends Religious Services: Not on file  . Active Member of Clubs or Organizations: Not on file  . Attends Banker  Meetings: Not on file  . Marital Status: Not on file    Allergies: No Known Allergies  Metabolic Disorder Labs: Lab Results  Component Value Date   HGBA1C 5.7 (H) 12/15/2015   MPG 117 12/15/2015   No results found for: PROLACTIN Lab Results  Component Value Date   CHOL 214 (H) 12/16/2015   TRIG 399 (H) 12/16/2015   HDL 25 (L) 12/16/2015   CHOLHDL 8.6 12/16/2015   VLDL 80 (H) 12/16/2015   LDLCALC 109 (H) 12/16/2015   Lab Results  Component Value Date   TSH 1.170 12/15/2015    Therapeutic Level Labs: No results found for: LITHIUM No results found for: VALPROATE No components found for:  CBMZ  Current Medications: Current Outpatient Medications   Medication Sig Dispense Refill  . ARIPiprazole (ABILIFY) 5 MG tablet Take 1 tablet (5 mg total) by mouth daily. 90 tablet 1  . atorvastatin (LIPITOR) 40 MG tablet TAKE 1 TABLET BY MOUTH DAILY AT 6PM 90 tablet 2  . diazepam (VALIUM) 2 MG tablet Take 0.5 tablets (1 mg total) by mouth daily as needed for anxiety. 15 tablet 3  . venlafaxine XR (EFFEXOR-XR) 150 MG 24 hr capsule Take 1 capsule (150 mg total) by mouth daily with breakfast. 90 capsule 1  . VENTOLIN HFA 108 (90 Base) MCG/ACT inhaler Inhale 1-2 puffs into the lungs every 6 (six) hours as needed for shortness of breath or wheezing.  0   No current facility-administered medications for this visit.     Musculoskeletal: Strength & Muscle Tone: N/A Gait & Station: N/A Patient leans: N/A  Psychiatric Specialty Exam: Review of Systems  There were no vitals taken for this visit.There is no height or weight on file to calculate BMI.  General Appearance: {Appearance:22683}  Eye Contact:  {BHH EYE CONTACT:22684}  Speech:  Clear and Coherent  Volume:  Normal  Mood:  {BHH MOOD:22306}  Affect:  {Affect (PAA):22687}  Thought Process:  Coherent  Orientation:  Full (Time, Place, and Person)  Thought Content: Logical   Suicidal Thoughts:  {ST/HT (PAA):22692}  Homicidal Thoughts:  {ST/HT (PAA):22692}  Memory:  Immediate;   Good  Judgement:  {Judgement (PAA):22694}  Insight:  {Insight (PAA):22695}  Psychomotor Activity:  Normal  Concentration:  Concentration: Good and Attention Span: Good  Recall:  Good  Fund of Knowledge: Good  Language: Good  Akathisia:  No  Handed:  Right  AIMS (if indicated): not done  Assets:  Communication Skills Desire for Improvement  ADL's:  Intact  Cognition: WNL  Sleep:  {BHH GOOD/FAIR/POOR:22877}   Screenings:   Assessment and Plan:  Samantha Turner is a 59 y.o. year old female with a history of depression, chronic systolic dysfunction with NYHA class II/III CHF, A flutter, OSA (she declined cpap  machine), who presents for follow up appointment for No diagnosis found.  # MDD in partial remission, recurrent  There has been steady improvement in depressive symptoms after reinitiating Abilify.  We will continue venlafaxine to target depression.  We will continue Abilify as adjunctive treatment for depression.  Discussed potential metabolic side effect.  We will continue Valium as needed for anxiety.  Discussed risk of dependence and oversedation.  Discussed behavioral activation.   Plan  1. Continue Effexor 150 mg daily- could not tolerate higher dose due to insomnia 2.ContinueAbilify 5 mg daily (3 refill order left) 3.Continue Valium 1 mg daily as needed for anxiety 4.Next appointment: 2/25 at 1 PM for 20 mins, phone  Past trials of medication: sertraline,  Prozac, Paxil, Lexapro, Effexor, Wellbutrin (worsening SI/insomnia)Lamotrigine   The patient demonstrates the following risk factors for suicide: Chronic risk factors for suicide include: psychiatric disorder of depressionand previous suicide attempts of having a gun. 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 Protective factorsfor this patient include: positive social support, coping skills and hope for the future. Considering these factors, the overall suicide risk at this point appearslow. Patient isappropriate for outpatient follow up  Norman Clay, MD 07/30/2019, 10:56 AM

## 2019-08-01 ENCOUNTER — Ambulatory Visit (HOSPITAL_COMMUNITY): Payer: BC Managed Care – PPO | Admitting: Psychiatry

## 2019-08-29 DIAGNOSIS — R0602 Shortness of breath: Secondary | ICD-10-CM | POA: Diagnosis not present

## 2019-08-29 DIAGNOSIS — M79622 Pain in left upper arm: Secondary | ICD-10-CM | POA: Diagnosis not present

## 2019-08-29 DIAGNOSIS — J90 Pleural effusion, not elsewhere classified: Secondary | ICD-10-CM | POA: Diagnosis not present

## 2019-08-29 DIAGNOSIS — F419 Anxiety disorder, unspecified: Secondary | ICD-10-CM | POA: Diagnosis not present

## 2019-08-29 DIAGNOSIS — M542 Cervicalgia: Secondary | ICD-10-CM | POA: Diagnosis not present

## 2019-08-29 DIAGNOSIS — I700 Atherosclerosis of aorta: Secondary | ICD-10-CM | POA: Diagnosis not present

## 2019-08-29 DIAGNOSIS — Z90710 Acquired absence of both cervix and uterus: Secondary | ICD-10-CM | POA: Diagnosis not present

## 2019-08-29 DIAGNOSIS — R0789 Other chest pain: Secondary | ICD-10-CM | POA: Diagnosis not present

## 2019-08-29 DIAGNOSIS — R079 Chest pain, unspecified: Secondary | ICD-10-CM | POA: Diagnosis not present

## 2019-08-29 DIAGNOSIS — Z79899 Other long term (current) drug therapy: Secondary | ICD-10-CM | POA: Diagnosis not present

## 2019-08-29 DIAGNOSIS — R7989 Other specified abnormal findings of blood chemistry: Secondary | ICD-10-CM | POA: Diagnosis not present

## 2019-08-29 DIAGNOSIS — Z9049 Acquired absence of other specified parts of digestive tract: Secondary | ICD-10-CM | POA: Diagnosis not present

## 2019-08-29 DIAGNOSIS — F329 Major depressive disorder, single episode, unspecified: Secondary | ICD-10-CM | POA: Diagnosis not present

## 2019-08-29 DIAGNOSIS — R791 Abnormal coagulation profile: Secondary | ICD-10-CM | POA: Diagnosis not present

## 2019-09-04 DIAGNOSIS — I700 Atherosclerosis of aorta: Secondary | ICD-10-CM | POA: Diagnosis not present

## 2019-09-04 DIAGNOSIS — G4733 Obstructive sleep apnea (adult) (pediatric): Secondary | ICD-10-CM | POA: Diagnosis not present

## 2019-09-04 DIAGNOSIS — R222 Localized swelling, mass and lump, trunk: Secondary | ICD-10-CM | POA: Diagnosis not present

## 2019-09-04 DIAGNOSIS — R161 Splenomegaly, not elsewhere classified: Secondary | ICD-10-CM | POA: Diagnosis not present

## 2019-09-04 DIAGNOSIS — Z299 Encounter for prophylactic measures, unspecified: Secondary | ICD-10-CM | POA: Diagnosis not present

## 2019-09-04 DIAGNOSIS — Z6839 Body mass index (BMI) 39.0-39.9, adult: Secondary | ICD-10-CM | POA: Diagnosis not present

## 2019-09-04 DIAGNOSIS — I10 Essential (primary) hypertension: Secondary | ICD-10-CM | POA: Diagnosis not present

## 2019-09-04 DIAGNOSIS — Z789 Other specified health status: Secondary | ICD-10-CM | POA: Diagnosis not present

## 2019-09-16 DIAGNOSIS — R0789 Other chest pain: Secondary | ICD-10-CM | POA: Diagnosis not present

## 2019-10-04 DIAGNOSIS — R079 Chest pain, unspecified: Secondary | ICD-10-CM | POA: Diagnosis not present

## 2019-10-04 DIAGNOSIS — R222 Localized swelling, mass and lump, trunk: Secondary | ICD-10-CM | POA: Diagnosis not present

## 2019-10-04 DIAGNOSIS — Z6839 Body mass index (BMI) 39.0-39.9, adult: Secondary | ICD-10-CM | POA: Diagnosis not present

## 2019-10-04 DIAGNOSIS — I10 Essential (primary) hypertension: Secondary | ICD-10-CM | POA: Diagnosis not present

## 2019-10-04 DIAGNOSIS — Z299 Encounter for prophylactic measures, unspecified: Secondary | ICD-10-CM | POA: Diagnosis not present

## 2019-10-15 DIAGNOSIS — R161 Splenomegaly, not elsewhere classified: Secondary | ICD-10-CM | POA: Diagnosis not present

## 2019-10-15 DIAGNOSIS — R222 Localized swelling, mass and lump, trunk: Secondary | ICD-10-CM | POA: Diagnosis not present

## 2019-10-15 DIAGNOSIS — R079 Chest pain, unspecified: Secondary | ICD-10-CM | POA: Diagnosis not present

## 2019-10-16 ENCOUNTER — Institutional Professional Consult (permissible substitution): Payer: BC Managed Care – PPO | Admitting: Internal Medicine

## 2019-10-21 ENCOUNTER — Ambulatory Visit (INDEPENDENT_AMBULATORY_CARE_PROVIDER_SITE_OTHER): Payer: BC Managed Care – PPO | Admitting: Internal Medicine

## 2019-10-21 ENCOUNTER — Telehealth: Payer: Self-pay | Admitting: Internal Medicine

## 2019-10-21 ENCOUNTER — Encounter: Payer: Self-pay | Admitting: Internal Medicine

## 2019-10-21 ENCOUNTER — Other Ambulatory Visit: Payer: Self-pay

## 2019-10-21 ENCOUNTER — Other Ambulatory Visit (HOSPITAL_COMMUNITY)
Admission: RE | Admit: 2019-10-21 | Discharge: 2019-10-21 | Disposition: A | Discharge status: Home / Self Care | Payer: BC Managed Care – PPO | Source: Ambulatory Visit | Attending: Internal Medicine | Admitting: Internal Medicine

## 2019-10-21 ENCOUNTER — Ambulatory Visit (HOSPITAL_COMMUNITY)
Admission: RE | Admit: 2019-10-21 | Discharge: 2019-10-21 | Disposition: A | Discharge status: Home / Self Care | Payer: BC Managed Care – PPO | Source: Ambulatory Visit | Attending: Internal Medicine | Admitting: Internal Medicine

## 2019-10-21 DIAGNOSIS — R0600 Dyspnea, unspecified: Secondary | ICD-10-CM | POA: Insufficient documentation

## 2019-10-21 DIAGNOSIS — D732 Chronic congestive splenomegaly: Secondary | ICD-10-CM | POA: Diagnosis not present

## 2019-10-21 DIAGNOSIS — J90 Pleural effusion, not elsewhere classified: Secondary | ICD-10-CM | POA: Diagnosis not present

## 2019-10-21 DIAGNOSIS — E039 Hypothyroidism, unspecified: Secondary | ICD-10-CM

## 2019-10-21 DIAGNOSIS — R0609 Other forms of dyspnea: Secondary | ICD-10-CM

## 2019-10-21 DIAGNOSIS — J9811 Atelectasis: Secondary | ICD-10-CM | POA: Insufficient documentation

## 2019-10-21 DIAGNOSIS — J984 Other disorders of lung: Secondary | ICD-10-CM | POA: Diagnosis not present

## 2019-10-21 DIAGNOSIS — R079 Chest pain, unspecified: Secondary | ICD-10-CM | POA: Diagnosis not present

## 2019-10-21 LAB — CBC WITH DIFFERENTIAL/PLATELET
Abs Immature Granulocytes: 0.02 10*3/uL (ref 0.00–0.07)
Basophils Absolute: 0 10*3/uL (ref 0.0–0.1)
Basophils Relative: 1 %
Eosinophils Absolute: 0.1 10*3/uL (ref 0.0–0.5)
Eosinophils Relative: 2 %
HCT: 37.8 % (ref 36.0–46.0)
Hemoglobin: 11.5 g/dL — ABNORMAL LOW (ref 12.0–15.0)
Immature Granulocytes: 0 %
Lymphocytes Relative: 26 %
Lymphs Abs: 1.7 10*3/uL (ref 0.7–4.0)
MCH: 25.9 pg — ABNORMAL LOW (ref 26.0–34.0)
MCHC: 30.4 g/dL (ref 30.0–36.0)
MCV: 85.1 fL (ref 80.0–100.0)
Monocytes Absolute: 0.3 10*3/uL (ref 0.1–1.0)
Monocytes Relative: 4 %
Neutro Abs: 4.3 10*3/uL (ref 1.7–7.7)
Neutrophils Relative %: 67 %
Platelets: 167 10*3/uL (ref 150–400)
RBC: 4.44 MIL/uL (ref 3.87–5.11)
RDW: 15 % (ref 11.5–15.5)
WBC: 6.4 10*3/uL (ref 4.0–10.5)
nRBC: 0 % (ref 0.0–0.2)

## 2019-10-21 LAB — BASIC METABOLIC PANEL
Anion gap: 12 (ref 5–15)
BUN: 9 mg/dL (ref 6–20)
CO2: 25 mmol/L (ref 22–32)
Calcium: 9 mg/dL (ref 8.9–10.3)
Chloride: 98 mmol/L (ref 98–111)
Creatinine, Ser: 0.69 mg/dL (ref 0.44–1.00)
GFR calc Af Amer: >60 mL/min (ref >60)
GFR calc non Af Amer: >60 mL/min (ref >60)
Glucose, Bld: 307 mg/dL — ABNORMAL HIGH (ref 70–99)
Potassium: 3.5 mmol/L (ref 3.5–5.1)
Sodium: 135 mmol/L (ref 135–145)

## 2019-10-21 LAB — HEPATIC FUNCTION PANEL
ALT: 14 U/L (ref 0–44)
AST: 17 U/L (ref 15–41)
Albumin: 3.7 g/dL (ref 3.5–5.0)
Alkaline Phosphatase: 144 U/L — ABNORMAL HIGH (ref 38–126)
Bilirubin, Direct: 0.1 mg/dL (ref 0.0–0.2)
Indirect Bilirubin: 0.7 mg/dL (ref 0.3–0.9)
Total Bilirubin: 0.8 mg/dL (ref 0.3–1.2)
Total Protein: 7.8 g/dL (ref 6.5–8.1)

## 2019-10-21 LAB — IRON AND TIBC
Iron: 57 ug/dL (ref 28–170)
Saturation Ratios: 14 % (ref 10.4–31.8)
TIBC: 416 ug/dL (ref 250–450)
UIBC: 359 ug/dL

## 2019-10-21 LAB — FERRITIN: Ferritin: 20 ng/mL (ref 11–307)

## 2019-10-21 LAB — TSH: TSH: 7.753 u[IU]/mL — ABNORMAL HIGH (ref 0.350–4.500)

## 2019-10-21 LAB — SEDIMENTATION RATE: Sed Rate: 64 mm/hr — ABNORMAL HIGH (ref 0–22)

## 2019-10-21 LAB — BRAIN NATRIURETIC PEPTIDE: B Natriuretic Peptide: 51 pg/mL (ref 0.0–100.0)

## 2019-10-21 NOTE — Telephone Encounter (Signed)
Spoke with Pam. She stated that she needed clarification on the IBC panel that was ordered. She stated that she needed to know what exactly is needed. If he wants to have the iron levels checked, it will need to be reordered as iron tibc and ferritin. Patient stopped by the lab today have her blood drawn. The blood is only good until tomorrow morning.   Dr. [name], please advise. Thanks!

## 2019-10-21 NOTE — Telephone Encounter (Signed)
Spoke with MW due to the nature of the call. He stated that it was ok to change the order to the iron tibc and ferritin order. Spoke with Elita Quick, she is aware that the order will be changed.   Nothing further needed at time of call.

## 2019-10-21 NOTE — Telephone Encounter (Signed)
Needing clarification of lab order ASAP.[name]

## 2019-10-21 NOTE — Progress Notes (Signed)
Samantha Turner, female    DOB: 1960-08-07    MRN: [id number]   Brief patient profile:  58 yowf never smoker with nl IUP's s resp problems at wt 160 baseline and eval in [facility] for cpap by Samantha Turner 2017-18 @ wt 240   could not tolerated with some breathing problems in 01/2016 rx albuterol prn with new complaint of intermittent L cp with deep breath and new doe 08/2019 and eval in [facility] with abnormal CT /abd US showing probable cirrhosis from fatty steatosis and splenomegaly (2x nl size) assoc with small L pl effusion so referred to pulmonary clinic in [facility]  10/21/2019 by Samantha   Sherryll Turner   Had also been eval summer 2017 for likely bronchogenic or mediastinal cyst felt to be benign and no further w/u needed      History of Present Illness  10/21/2019  Pulmonary/ 1st office eval/Samantha Turner/Samantha [facility] @ wt 246  Chief Complaint  Patient presents with  . Pulmonary Consult    Referred by [facility] for Peritracheal Mass. Pt c/o pain in her left side x 2 months- left rib area up to her left ear. She also c/o SOB with or without any exertion- worse with exertion.   Dyspnea:  MMRC3 = can't walk 100 yards even at a slow pace at a flat grade s stopping due to sob   Cough: none  Sleep:45 degrees flat bed lots of pillows x yeats  SABA use: once a week Cp is not really intermittent in that it feels uncomfortable every time she takes a deep breath with initially pain radiating to the L shoulder no longer present.   No obvious day to day or daytime variability or assoc excess/ purulent sputum or mucus plugs or hemoptysis or cp or chest tightness, subjective wheeze or overt sinus or hb symptoms.   Sleeping  without nocturnal  or early am exacerbation  of respiratory  c/o's or need for noct saba. Also denies any obvious fluctuation of symptoms with weather or environmental changes or other aggravating or alleviating factors except as outlined above   No unusual exposure hx or h/o childhood pna/ asthma or  knowledge of premature birth.  Current Allergies, Complete Past Medical History, Past Surgical History, Family History, and Social History were reviewed in [facility] record.  ROS  The following are not active complaints unless bolded Hoarseness, sore throat, dysphagia, dental problems, itching, sneezing,  nasal congestion or discharge of excess mucus or purulent secretions, ear ache,   fever, chills, sweats, unintended wt loss or wt gain, classically  exertional cp,  orthopnea pnd or arm/hand swelling  or leg swelling, presyncope, palpitations, abdominal pain, anorexia, nausea, vomiting, diarrhea  or change in bowel habits or change in bladder habits, change in stools or change in urine, dysuria, hematuria,  rash, arthralgias, visual complaints, headache, numbness, weakness or ataxia or problems with walking or coordination,  change in mood or  memory.           Past Medical History:  Diagnosis Date  . Anxiety   . Bronchogenic cyst   . Depression   . Essential hypertension   . Headache   . History of cardiac catheterization    Normal coronary arteries July 2017  . Nonischemic cardiomyopathy (HCC)   . Obstructive sleep apnea   . Paroxysmal atrial flutter (HCC)    RFA 07/2016 - Samantha. Johney Turner    Outpatient Medications Prior to Visit  Medication Sig Dispense Refill  . ARIPiprazole (ABILIFY)  5 MG tablet Take 1 tablet (5 mg total) by mouth daily. 90 tablet 1  . atorvastatin (LIPITOR) 40 MG tablet TAKE 1 TABLET BY MOUTH DAILY AT 6PM 90 tablet 2  . diazepam (VALIUM) 2 MG tablet Take 0.5 tablets (1 mg total) by mouth daily as needed for anxiety. 15 tablet 3  . furosemide (LASIX) 20 MG tablet Take 10 mg by mouth daily.    Marland Kitchen gabapentin (NEURONTIN) 100 MG capsule Take 100 mg by mouth at bedtime.    Marland Kitchen venlafaxine XR (EFFEXOR-XR) 150 MG 24 hr capsule Take 1 capsule (150 mg total) by mouth daily with breakfast. 90 capsule 1  . VENTOLIN HFA 108 (90 Base) MCG/ACT inhaler Inhale  1-2 puffs into the lungs every 6 (six) hours as needed for shortness of breath or wheezing.  0        Objective:     BP 120/70 (BP Location: Left Arm, Cuff Size: Normal)   Pulse 75   Temp (!) 97 F (36.1 C) (Temporal)   Ht 5\' 3"  (1.6 m)   Wt 246 lb (111.6 kg)   SpO2 96% Comment: on RA  BMI 43.58 kg/m   SpO2: 96 %(on RA)   Wt Readings from Last 3 Encounters:  10/21/19 246 lb (111.6 kg)  12/24/18 257 lb (116.6 kg)  09/20/17 259 lb (117.5 kg)      Obese Sam[facility] amb wf nad   HEENT : pt wearing mask not removed for exam due to covid -19 concerns.    NECK :  without JVD/Nodes/TM/ nl carotid upstrokes bilaterally   LUNGS: no acc muscle use,  Nl contour chest which is clear to A and P bilaterally without cough on insp or exp maneuvers   CV:  RRR  no s3 or murmur or increase in P2, and no edema   ABD:  Quite obese soft and with fullness LUQ where she is tender and reproduces her cp same location with limited inspiratory excursion in the supine position.   MS:  Nl gait/ ext warm without deformities, calf tenderness, cyanosis or clubbing No obvious joint restrictions   SKIN: warm and dry with chronic  venous stasis changes both LE's    NEURO:  alert, approp, nl sensorium with  no motor or cerebellar deficits apparent.     CXR PA and Lateral:   10/21/2019 :    I personally reviewed images and agree with radiology impression as follows:   1. Trace left pleural effusion, decreased from prior. 2. Mild left mid and lower lung atelectasis.   CTa 08/29/19 Small Left pl effusion   Decrease size L parathracheal mass Cirrohotic liver with splenomegaly confirmed on u/s s ascites, infarcts but twice nl size  Labs ordered/ reviewed:      Chemistry      Component Value Date/Time   NA 135 10/21/2019 1140   NA 142 07/19/2016 1103   K 3.5 10/21/2019 1140   CL 98 10/21/2019 1140   CO2 25 10/21/2019 1140   BUN 9 10/21/2019 1140   BUN 10 07/19/2016 1103   CREATININE 0.69  10/21/2019 1140      Component Value Date/Time   CALCIUM 9.0 10/21/2019 1140   ALKPHOS 144 (H) 10/21/2019 1140   AST 17 10/21/2019 1140   ALT 14 10/21/2019 1140   BILITOT 0.8 10/21/2019 1140     Albumin                      3.7  Lab Results  Component Value Date   WBC 6.4 10/21/2019   HGB 11.5 (L) 10/21/2019   HCT 37.8 10/21/2019   MCV 85.1 10/21/2019   PLT 167 10/21/2019       EOS                                                              0.1                                     10/21/2019       Lab Results  Component Value Date   TSH 7.753 (H) 10/21/2019        BNP                                                              51                                      10/21/2019       Lab Results  Component Value Date   ESRSEDRATE 64 (H) 10/21/2019   ESRSEDRATE 29 (H) 12/17/2015       Labs ordered 10/21/2019  :     alpha one AT phenotype      Assessment   Bronchogenic cyst MR Cardiac 01/31/16    1. 2.8 x 2.3 x 4.8 cm fluid signal intensity structure in the middle mediastinum has a benign appearance. Differential considerations include an exophytic pericardial cyst, enlarged superior pericardial recess, or other benign lesions such as a bronchogenic cyst. -CTa 08/29/19 Small Left pl effusions  Decrease size L parathracheal mass  No further w/u needed/ proving benign as suggested in 2017   Discussed in detail all the  indications, usual  risks and alternatives  relative to the benefits with patient who agrees to proceed with conservative f/u as outlined  = no directed f/u       DOE (dyspnea on exertion) Worse since March 2021 with LUQ/L Chest pain and very small effusion  No evidence of chf/ PE/ asthma/copd/ renal or metabolic dz or signficant anemia or Fe disorder here.    Strongly doubt that the effusion is contributing to the doe but note she already has significant restrictive changes due to obesity and now is uncomfortable walking or taking deep  breaths.  For now do not rec attempting thoracentesis but just do conservative f/u on this and proceed to w/u the splenomegaly (see separate a/p)      Congestive splenomegaly See CT [facility] 08/29/19 c/w cirrhosis with u/s showing 2x nl size  - referred to [facility]  Discussed with radiology and not likely to be due to anything other than liver dz so next step is GI eval rather than heme/onc  Discussed in detail all the  indications, usual  risks and alternatives  relative to the benefits with patient who agrees to proceed with w/u as outlined.  Hypothyroidism, acquired Lab Results  Component Value Date   TSH 7.753 (H) 10/21/2019    >>> will need f/u by PCP but doubt contributing to effusion or sob / may be contributing to inability to lose wt though     Each maintenance medication was reviewed in detail including emphasizing most importantly the difference between maintenance and prns and under what circumstances the prns are to be triggered using an action plan format where appropriate.  Total time for H and P, chart review, counseling,   and generating customized AVS unique to this office consultation  / charting = 60 min           Christinia Gully, MD 10/21/2019

## 2019-10-21 NOTE — Patient Instructions (Addendum)
Please remember to go to the lab and x-ray department   for your tests - we will call you with the results when they are available.     Will refer to GI to eval cirrhosis/ splenomegaly and f/u in 6 weeks with cxr on return

## 2019-10-21 NOTE — Telephone Encounter (Signed)
See duplicate encounter from today.

## 2019-10-21 NOTE — Assessment & Plan Note (Addendum)
Worse since March 2021 with LUQ/L Chest pain and very small effusion  No evidence of chf/ PE/ asthma/copd/ renal or metabolic dz or signficant anemia or Fe disorder here.    Strongly doubt that the effusion is contributing to the doe but note she already has significant restrictive changes due to obesity and now is uncomfortable walking or taking deep breaths.  For now do not rec attempting thoracentesis but just do conservative f/u on this and proceed to w/u the splenomegaly (see separate a/p)

## 2019-10-22 ENCOUNTER — Other Ambulatory Visit: Payer: Self-pay | Admitting: Internal Medicine

## 2019-10-22 ENCOUNTER — Encounter: Payer: Self-pay | Admitting: Internal Medicine

## 2019-10-22 DIAGNOSIS — K7460 Unspecified cirrhosis of liver: Secondary | ICD-10-CM | POA: Diagnosis not present

## 2019-10-22 DIAGNOSIS — D732 Chronic congestive splenomegaly: Secondary | ICD-10-CM

## 2019-10-22 DIAGNOSIS — Z6839 Body mass index (BMI) 39.0-39.9, adult: Secondary | ICD-10-CM | POA: Diagnosis not present

## 2019-10-22 DIAGNOSIS — R222 Localized swelling, mass and lump, trunk: Secondary | ICD-10-CM | POA: Diagnosis not present

## 2019-10-22 DIAGNOSIS — I700 Atherosclerosis of aorta: Secondary | ICD-10-CM | POA: Diagnosis not present

## 2019-10-22 DIAGNOSIS — Z299 Encounter for prophylactic measures, unspecified: Secondary | ICD-10-CM | POA: Diagnosis not present

## 2019-10-22 DIAGNOSIS — E039 Hypothyroidism, unspecified: Secondary | ICD-10-CM | POA: Insufficient documentation

## 2019-10-22 DIAGNOSIS — I10 Essential (primary) hypertension: Secondary | ICD-10-CM | POA: Diagnosis not present

## 2019-10-22 NOTE — Assessment & Plan Note (Signed)
MR Cardiac 01/31/16    1. 2.8 x 2.3 x 4.8 cm fluid signal intensity structure in the middle mediastinum has a benign appearance. Differential considerations include an exophytic pericardial cyst, enlarged superior pericardial recess, or other benign lesions such as a bronchogenic cyst. -CTa 08/29/19 Small Left pl effusions  Decrease size L parathracheal mass  No further w/u needed/ proving benign as suggested in 2017   Discussed in detail all the  indications, usual  risks and alternatives  relative to the benefits with patient who agrees to proceed with conservative f/u as outlined  = no directed f/u

## 2019-10-22 NOTE — Assessment & Plan Note (Signed)
See CT [facility] 08/29/19 c/w cirrhosis with u/s showing 2x nl size  - referred to [facility] GI  Discussed with radiology and not likely to be due to anything other than liver dz so next step is GI eval rather than heme/onc  Discussed in detail all the  indications, usual  risks and alternatives  relative to the benefits with patient who agrees to proceed with w/u as outlined.            Each maintenance medication was reviewed in detail including emphasizing most importantly the difference between maintenance and prns and under what circumstances the prns are to be triggered using an action plan format where appropriate.  Total time for H and P, chart review, counseling,   and generating customized AVS unique to this office consultation  / charting = 60 min

## 2019-10-22 NOTE — Progress Notes (Signed)
Referral to GI done. LMTCB to get her set up for the 6 wk rov with MW in [facility].

## 2019-10-22 NOTE — Assessment & Plan Note (Signed)
Lab Results  Component Value Date   TSH 7.753 (H) 10/21/2019     >>> will need f/u by PCP but doubt contributing to effusion or sob / may be contributing to inability to lose wt though

## 2019-10-25 ENCOUNTER — Encounter (INDEPENDENT_AMBULATORY_CARE_PROVIDER_SITE_OTHER): Payer: Self-pay | Admitting: Gastroenterology

## 2019-10-25 LAB — ALPHA-1 ANTITRYPSIN PHENOTYPE: A-1 Antitrypsin, Ser: 160 mg/dL (ref 101–187)

## 2019-10-28 NOTE — Progress Notes (Signed)
Left detailed msg on machine ok per DPR

## 2019-10-30 ENCOUNTER — Encounter: Payer: Self-pay | Admitting: Internal Medicine

## 2019-11-01 ENCOUNTER — Telehealth (HOSPITAL_COMMUNITY): Payer: Self-pay | Admitting: Psychiatry

## 2019-11-01 MED ORDER — ARIPIPRAZOLE 5 MG PO TABS: 5.0000 mg | ORAL_TABLET | Freq: Every day | ORAL | 0 refills | Status: DC

## 2019-11-01 MED ORDER — VENLAFAXINE HCL ER 150 MG PO CP24: 150.0000 mg | ORAL_CAPSULE | Freq: Every day | ORAL | 0 refills | Status: DC

## 2019-11-01 NOTE — Telephone Encounter (Signed)
Appt made with patient.  

## 2019-11-01 NOTE — Telephone Encounter (Signed)
Ordered venlafaxine, Abilify refill per request from the pharmacy. Please contact the patient and make follow up appointment.

## 2019-11-05 DIAGNOSIS — Z299 Encounter for prophylactic measures, unspecified: Secondary | ICD-10-CM | POA: Diagnosis not present

## 2019-11-05 DIAGNOSIS — F319 Bipolar disorder, unspecified: Secondary | ICD-10-CM | POA: Diagnosis not present

## 2019-11-05 DIAGNOSIS — R079 Chest pain, unspecified: Secondary | ICD-10-CM | POA: Diagnosis not present

## 2019-11-05 DIAGNOSIS — I10 Essential (primary) hypertension: Secondary | ICD-10-CM | POA: Diagnosis not present

## 2019-11-05 DIAGNOSIS — Z6839 Body mass index (BMI) 39.0-39.9, adult: Secondary | ICD-10-CM | POA: Diagnosis not present

## 2019-11-05 DIAGNOSIS — I4891 Unspecified atrial fibrillation: Secondary | ICD-10-CM | POA: Diagnosis not present

## 2019-11-13 NOTE — Progress Notes (Signed)
Virtual Visit via Video Note  I connected with Samantha Turner on 11/14/19 at  4:00 PM EDT by a video enabled telemedicine application and verified that I am speaking with the correct person using two identifiers.   I discussed the limitations of evaluation and management by telemedicine and the availability of in person appointments. The patient expressed understanding and agreed to proceed.    I discussed the assessment and treatment plan with the patient. The patient was provided an opportunity to ask questions and all were answered. The patient agreed with the plan and demonstrated an understanding of the instructions.   The patient was advised to call back or seek an in-person evaluation if the symptoms worsen or if the condition fails to improve as anticipated.  I provided 10 minutes of non-face-to-face time during this encounter.  Location: patient- home, provider- office   Norman Clay, MD     [facility] MD/PA/NP OP Progress Note  11/14/2019 4:15 PM Vearl Aitken  MRN:  [id number]  Chief Complaint:  Chief Complaint    Follow-up; Anxiety; Depression     HPI:  This is a follow-up appointment for depression and anxiety.  She states that she has been doing well.  She missed the appointment last time due to COVID.  She has an upcoming appointment for evaluation of the spleen and liver.  She reports good relationship with her husband.  She enjoys going outside with her family.  She denies any concerns at this time.  She has insomnia.  She denies feeling depressed.  She has good energy and motivation.  She has fair concentration.  She denies SI.  She feels anxious at times about things she needs to do, although she is unable to elaborate those. She denies panic attacks.   Daily routine: plant vegetables, going outside with her family Household: Husband, two daughters, two grandchildren, age12, 23   Wt Readings from Last 3 Encounters:  10/21/19 246 lb (111.6 kg)  12/24/18 257 lb (116.6  kg)  02/08/18 259 lb (117.5 kg)    Visit Diagnosis:    ICD-10-CM   1. MDD (major depressive disorder), recurrent, in full remission ([place])  F33.42     Past Psychiatric History: Please see initial evaluation for full details. I have reviewed the history. No updates at this time.     Past Medical History:  Past Medical History:  Diagnosis Date  . Anxiety   . Bronchogenic cyst   . Depression   . Essential hypertension   . Headache   . History of cardiac catheterization    Normal coronary arteries July 2017  . Nonischemic cardiomyopathy ([place])   . Obstructive sleep apnea   . Paroxysmal atrial flutter ([place])    RFA 07/2016 - Dr. Rayann Heman    Past Surgical History:  Procedure Laterality Date  . A-FLUTTER ABLATION N/A 07/26/2016   Procedure: A-Flutter Ablation;  Surgeon: Thompson Grayer, MD;  Location: [place] [facility];  Service: Cardiovascular;  Laterality: N/A;  . ABDOMINAL HYSTERECTOMY    . CARDIAC CATHETERIZATION N/A 12/16/2015   Procedure: Left Heart Cath and Coronary Angiography;  Surgeon: Wellington Hampshire, MD;  Location: [place] [facility];  Service: Cardiovascular;  Laterality: N/A;  . CARDIOVERSION N/A 12/18/2015   Procedure: CARDIOVERSION;  Surgeon: Dorothy Spark, MD;  Location: [place];  Service: Cardiovascular;  Laterality: N/A;  . CHOLECYSTECTOMY    . TEE WITHOUT CARDIOVERSION N/A 12/18/2015   Procedure: TRANSESOPHAGEAL ECHOCARDIOGRAM (TEE);  Surgeon: Dorothy Spark, MD;  Location: [facility]  ENDOSCOPY;  Service: Cardiovascular;  Laterality: N/A;  . TUMOR REMOVAL  07/02/2015   ovarian    Family Psychiatric History: Please see initial evaluation for full details. I have reviewed the history. No updates at this time.     Family History:  Family History  Problem Relation Age of Onset  . Heart failure Mother     Social History:  Social History   Socioeconomic History  . Marital status: Single    Spouse name: Not on file  . Number of children: Not on file  . Years  of education: Not on file  . Highest education level: Not on file  Occupational History  . Not on file  Tobacco Use  . Smoking status: Never Smoker  . Smokeless tobacco: Never Used  Substance and Sexual Activity  . Alcohol use: Yes    Comment: rarel  . Drug use: No  . Sexual activity: Yes    Birth control/protection: Surgical  Other Topics Concern  . Not on file  Social History Narrative  . Not on file   Social Determinants of Health   Financial Resource Strain:   . Difficulty of Paying Living Expenses:   Food Insecurity:   . Worried About Programme researcher, broadcasting/film/video in the Last Year:   . Barista in the Last Year:   Transportation Needs:   . Freight forwarder (Medical):   Marland Kitchen Lack of Transportation (Non-Medical):   Physical Activity:   . Days of Exercise per Week:   . Minutes of Exercise per Session:   Stress:   . Feeling of Stress :   Social Connections:   . Frequency of Communication with Friends and Family:   . Frequency of Social Gatherings with Friends and Family:   . Attends Religious Services:   . Active Member of Clubs or Organizations:   . Attends Banker Meetings:   Marland Kitchen Marital Status:     Allergies: No Known Allergies  Metabolic Disorder Labs: Lab Results  Component Value Date   HGBA1C 5.7 (H) 12/15/2015   MPG 117 12/15/2015   No results found for: PROLACTIN Lab Results  Component Value Date   CHOL 214 (H) 12/16/2015   TRIG 399 (H) 12/16/2015   HDL 25 (L) 12/16/2015   CHOLHDL 8.6 12/16/2015   VLDL 80 (H) 12/16/2015   LDLCALC 109 (H) 12/16/2015   Lab Results  Component Value Date   TSH 7.753 (H) 10/21/2019   TSH 1.170 12/15/2015    Therapeutic Level Labs: No results found for: LITHIUM No results found for: VALPROATE No components found for:  CBMZ  Current Medications: Current Outpatient Medications  Medication Sig Dispense Refill  . [START ON 01/31/2020] ARIPiprazole (ABILIFY) 5 MG tablet Take 1 tablet (5 mg total) by  mouth daily. 90 tablet 0  . atorvastatin (LIPITOR) 40 MG tablet TAKE 1 TABLET BY MOUTH DAILY AT 6PM 90 tablet 2  . diazepam (VALIUM) 2 MG tablet Take 0.5 tablets (1 mg total) by mouth daily as needed for anxiety. 15 tablet 3  . furosemide (LASIX) 20 MG tablet Take 10 mg by mouth daily.    Marland Kitchen gabapentin (NEURONTIN) 100 MG capsule Take 100 mg by mouth at bedtime.    Melene Muller ON 01/31/2020] venlafaxine XR (EFFEXOR-XR) 150 MG 24 hr capsule Take 1 capsule (150 mg total) by mouth daily with breakfast. 90 capsule 0  . VENTOLIN HFA 108 (90 Base) MCG/ACT inhaler Inhale 1-2 puffs into the lungs every 6 (six) hours  as needed for shortness of breath or wheezing.  0   No current facility-administered medications for this visit.     Musculoskeletal: Strength & Muscle Tone: N/A Gait & Station: N/A Patient leans: N/A  Psychiatric Specialty Exam: Review of Systems  Psychiatric/Behavioral: Positive for sleep disturbance. Negative for agitation, behavioral problems, confusion, decreased concentration, dysphoric mood, hallucinations, self-injury and suicidal ideas. The patient is nervous/anxious. The patient is not hyperactive.   All other systems reviewed and are negative.   There were no vitals taken for this visit.There is no height or weight on file to calculate BMI.  General Appearance: Fairly Groomed  Eye Contact:  Good  Speech:  Clear and Coherent  Volume:  Normal  Mood:  "good  Affect:  Appropriate, Congruent and euthymic  Thought Process:  Coherent  Orientation:  Full (Time, Place, and Person)  Thought Content: Logical   Suicidal Thoughts:  No  Homicidal Thoughts:  No  Memory:  Immediate;   Good  Judgement:  Good  Insight:  Fair  Psychomotor Activity:  Normal  Concentration:  Concentration: Good and Attention Span: Good  Recall:  Good  Fund of Knowledge: Good  Language: Good  Akathisia:  No  Handed:  Right  AIMS (if indicated): not done  Assets:  Communication Skills Desire for  Improvement  ADL's:  Intact  Cognition: WNL  Sleep:  Poor   Screenings:   Assessment and Plan:  Samantha Turner is a 59 y.o. year old female with a history of depression, chronic systolic dysfunction with NYHA class II/III CHF, A flutter, OSA (she declined cpap machine), who presents for follow up appointment for below.   1. MDD (major depressive disorder), recurrent, in full remission (HCC) She denies significant mood symptoms since the last visit.  Will continue venlafaxine to target depression.  We will continue Abilify adjunctive treatment for depression.  Discussed potential metabolic side effect.  We will continue Valium as needed for anxiety.  Discussed risk of dependence and oversedation.  Discussed behavioral activation.   # Insomnia Although she was diagnosed with sleep apnea, she is not amenable to use CPAP machine. Will continue to discuss as needed.   Plan I have reviewed and updated plans as below 1. Continue Effexor 150 mg daily- could not tolerate higher dose due to insomnia 2.ContinueAbilify 5 mg daily (3 refill order left) 3.Continue Valium 1 mg daily as needed for anxiety(recently filled by PCP) 4.Next appointment: 9/16 at 2:30 for 20 mins, video  Past trials of medication: sertraline, Prozac, Paxil, Lexapro, Effexor, Wellbutrin (worsening SI/insomnia)Lamotrigine   The patient demonstrates the following risk factors for suicide: Chronic risk factors for suicide include: psychiatric disorder of depressionand previous suicide attempts of having a gun. 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 Protective factorsfor this patient include: positive social support, coping skills and hope for the future. Considering these factors, the overall suicide risk at this point appearslow. Patient isappropriate for outpatient follow up  Neysa Hotter, MD 11/14/2019, 4:15 PM

## 2019-11-14 ENCOUNTER — Telehealth (INDEPENDENT_AMBULATORY_CARE_PROVIDER_SITE_OTHER): Payer: BC Managed Care – PPO | Admitting: Psychiatry

## 2019-11-14 ENCOUNTER — Other Ambulatory Visit: Payer: Self-pay

## 2019-11-14 ENCOUNTER — Encounter (HOSPITAL_COMMUNITY): Payer: Self-pay | Admitting: Psychiatry

## 2019-11-14 DIAGNOSIS — F3342 Major depressive disorder, recurrent, in full remission: Secondary | ICD-10-CM | POA: Diagnosis not present

## 2019-11-14 MED ORDER — ARIPIPRAZOLE 5 MG PO TABS: 5.0000 mg | ORAL_TABLET | Freq: Every day | ORAL | 0 refills | Status: DC

## 2019-11-14 MED ORDER — VENLAFAXINE HCL ER 150 MG PO CP24: 150.0000 mg | ORAL_CAPSULE | Freq: Every day | ORAL | 0 refills | Status: DC

## 2019-12-05 ENCOUNTER — Other Ambulatory Visit: Payer: Self-pay | Admitting: Internal Medicine

## 2019-12-05 DIAGNOSIS — R0609 Other forms of dyspnea: Secondary | ICD-10-CM

## 2019-12-06 ENCOUNTER — Ambulatory Visit: Payer: BC Managed Care – PPO | Admitting: Internal Medicine

## 2019-12-06 ENCOUNTER — Encounter: Payer: Self-pay | Admitting: Internal Medicine

## 2019-12-06 ENCOUNTER — Other Ambulatory Visit: Payer: Self-pay

## 2019-12-06 DIAGNOSIS — R0600 Dyspnea, unspecified: Secondary | ICD-10-CM | POA: Diagnosis not present

## 2019-12-06 DIAGNOSIS — D732 Chronic congestive splenomegaly: Secondary | ICD-10-CM

## 2019-12-06 DIAGNOSIS — E039 Hypothyroidism, unspecified: Secondary | ICD-10-CM

## 2019-12-06 DIAGNOSIS — R0609 Other forms of dyspnea: Secondary | ICD-10-CM

## 2019-12-06 NOTE — Progress Notes (Signed)
Samantha Turner, female    DOB: 01/18/1961    MRN: [id number]   Brief patient profile:  58 yowf never smoker with nl IUP's s resp problems at wt 160 baseline and eval in [facility] for cpap by Kendrick Fries 2017-18 @ wt 240   could not tolerated with some breathing problems in 01/2016 rx albuterol prn with new complaint of intermittent L cp with deep breath and new doe 08/2019 and eval in [facility] with abnormal CT /abd US showing probable cirrhosis from fatty steatosis and splenomegaly (2x nl size) assoc with small L pl effusion so referred to pulmonary clinic in [facility]  10/21/2019 by Dr   Sherryll Burger   Had also been eval summer 2017 for likely bronchogenic or mediastinal cyst felt to be benign and no further w/u needed      History of Present Illness  10/21/2019  Pulmonary/ 1st office eval/Samantha Turner/[place] @ wt 246  Chief Complaint  Patient presents with  . Pulmonary Consult    Referred by Dr Kirstie Peri for Peritracheal Mass. Pt c/o pain in her left side x 2 months- left rib area up to her left ear. She also c/o SOB with or without any exertion- worse with exertion.   Dyspnea:  MMRC3 = can't walk 100 yards even at a slow pace at a flat grade s stopping due to sob   Cough: none  Sleep:45 degrees flat bed lots of pillows x years  SABA use: once a week Cp is not really intermittent in that it feels uncomfortable every time she takes a deep breath with initially pain radiating to the L shoulder no longer present.  rec Will refer to GI to eval cirrhosis/ splenomegaly and f/u in 6 weeks with cxr on return    12/06/2019  f/u ov/[name] re:  Doe/ abd pain Chief Complaint  Patient presents with  . Follow-up    Breathing has improved some since the last visit. She is using her albuterol inhaler 2 x per wk on average.   Dyspnea:  [place] at [place] slow pace  Cough: none  Sleeping: 45 degrees no problem  SABA use:as above, never prechallenges  02: no   No obvious day to day or daytime variability  or assoc excess/ purulent sputum or mucus plugs or hemoptysis or cp or chest tightness, subjective wheeze or overt sinus or hb symptoms.   Sleeping as above without nocturnal  or early am exacerbation  of respiratory  c/o's or need for noct saba. Also denies any obvious fluctuation of symptoms with weather or environmental changes or other aggravating or alleviating factors except as outlined above   No unusual exposure hx or h/o childhood pna/ asthma or knowledge of premature birth.  Current Allergies, Complete Past Medical History, Past Surgical History, Family History, and Social History were reviewed in [employer] record.  ROS  The following are not active complaints unless bolded Hoarseness, sore throat, dysphagia, dental problems, itching, sneezing,  nasal congestion or discharge of excess mucus or purulent secretions, ear ache,   fever, chills, sweats, unintended wt loss or wt gain, classically pleuritic or exertional cp,  orthopnea pnd or arm/hand swelling  or leg swelling, presyncope, palpitations, abdominal pain, anorexia, nausea, vomiting, diarrhea  or change in bowel habits or change in bladder habits, change in stools or change in urine, dysuria, hematuria,  rash, arthralgias, visual complaints, headache, numbness, weakness or ataxia or problems with walking or coordination,  change in mood or  memory.  Current Meds  Medication Sig  . [START ON 01/31/2020] ARIPiprazole (ABILIFY) 5 MG tablet Take 1 tablet (5 mg total) by mouth daily.  Samantha Turner atorvastatin (LIPITOR) 40 MG tablet TAKE 1 TABLET BY MOUTH DAILY AT 6PM  . diazepam (VALIUM) 2 MG tablet Take 0.5 tablets (1 mg total) by mouth daily as needed for anxiety.  . furosemide (LASIX) 20 MG tablet Take 10 mg by mouth daily.  Samantha Turner gabapentin (NEURONTIN) 100 MG capsule Take 100 mg by mouth at bedtime.  Samantha Turner ON 01/31/2020] venlafaxine XR (EFFEXOR-XR) 150 MG 24 hr capsule Take 1 capsule (150 mg total) by mouth daily  with breakfast.  . VENTOLIN HFA 108 (90 Base) MCG/ACT inhaler Inhale 1-2 puffs into the lungs every 6 (six) hours as needed for shortness of breath or wheezing.               Past Medical History:  Diagnosis Date  . Anxiety   . Bronchogenic cyst   . Depression   . Essential hypertension   . Headache   . History of cardiac catheterization    Normal coronary arteries July 2017  . Nonischemic cardiomyopathy (HCC)   . Obstructive sleep apnea   . Paroxysmal atrial flutter (HCC)    RFA 07/2016 - Dr. Johney Frame        Objective:      obese pleasant wf nad     12/06/2019          242   10/21/19 246 lb (111.6 kg)  12/24/18 257 lb (116.6 kg)  09/20/17 259 lb (117.5 kg)     Vital signs reviewed  12/06/2019  - Note at rest 02 sats  96% on RA       HEENT : pt wearing mask not removed for exam due to covid -19 concerns.    NECK :  without JVD/Nodes/TM/ nl carotid upstrokes bilaterally   LUNGS: no acc muscle use,  Nl contour chest which is clear to A and P bilaterally without cough on insp or exp maneuvers   CV:  RRR  no s3 or murmur or increase in P2, and no edema   ABD:  Quite obese soft and nontender with limited inspiratory excursion in the supine position. No bruits or organomegaly appreciated, bowel sounds nl  MS:  Nl gait/ ext warm without deformities, calf tenderness, cyanosis or clubbing No obvious joint restrictions   SKIN: warm and dry without lesions    NEURO:  alert, approp, nl sensorium with  no motor or cerebellar deficits apparent.                    Lab Results  Component Value Date   TSH 7.753 (H) 10/21/2019        BNP                                                              51                                      10/21/2019           Assessment

## 2019-12-06 NOTE — Assessment & Plan Note (Signed)
Worse since March 2021 with LUQ/L Chest pain and very small effusion - alpha one AT  Phenotype  10/21/19  MM  Level 160 -  12/06/2019   Walked RA  approx   400 ft  @ nl pace  stopped due to end of study, min sob with sats  98%   No evidence of a significant lung problem here   I spent extra time with pt today reviewing appropriate use of albuterol for prn use on exertion with the following points: 1) saba is for relief of sob that does not improve by walking a slower pace or resting but rather if the pt does not improve after trying this first. 2) If the pt is convinced, as many are, that saba helps recover from activity faster then it's easy to tell if this is the case by re-challenging : ie stop, take the inhaler, then p 5 minutes try the exact same activity (intensity of workload) that just caused the symptoms and see if they are substantially diminished or not after saba 3) if there is an activity that reproducibly causes the symptoms, try the saba 15 min before the activity on alternate days   If in fact the saba really does help, then fine to continue to use it prn but advised may need to look closer at the maintenance regimen being used to achieve better control of airways disease with exertion.

## 2019-12-06 NOTE — Assessment & Plan Note (Signed)
Body mass index is 42.87 kg/m.  -  trending down Lab Results  Component Value Date   TSH 7.753 (H) 10/21/2019     Contributing to gerd risk/ doe/reviewed the need and the process to achieve and maintain neg calorie balance > defer f/u primary care including intermittently monitoring thyroid status    Will recheck tsh , likely needs supplementation    I strongly recommend your get the pfizer or moderna vaccine as soon as you can,  especially if you hear about the Delta variant in our area - both of these vaccines have proven to be safe and effective with over 150 million Americans receiving them.            Each maintenance medication was reviewed in detail including emphasizing most importantly the difference between maintenance and prns and under what circumstances the prns are to be triggered using an action plan format where appropriate.  Total time for H and P, chart review, counseling, addressing multiple chronic unresolved problems and directly observing portions of ambulatory 02 saturation study/  and generating customized AVS unique to this office visit / charting = 30 min

## 2019-12-06 NOTE — Assessment & Plan Note (Signed)
See CT [facility] 08/29/19 c/w cirrhosis with u/s showing 2x nl size  - alpha one AT  Phenotype  10/21/19  MM  Level 160 - referred to GI  10/22/2019  > for ov 12/11/19   Advised keep appt

## 2019-12-06 NOTE — Assessment & Plan Note (Signed)
Lab Results  Component Value Date   TSH 7.753 (H) 10/21/2019     Red repeat TSH > f/u PCP or endocrine

## 2019-12-06 NOTE — Patient Instructions (Signed)
I strongly recommend your get the pfizer or moderna vaccine as soon as you can,  especially if you hear about the Delta variant in our area - both of these vaccines have proven to be safe and effective with over 150 million Americans receiving them.     I will recheck your Thyroid function when you see your GI doctor for labs next week  Try albuterol 15 min before an activity that you know would make you short of breath and see if it makes any difference and if makes none then don't take it after activity unless you can't catch your breath.        To get the most out of exercise, you need to be continuously aware that you are short of breath, but never out of breath, for 20- 30 minutes daily. As you improve, it will actually be easier for you to do the same amount of exercise  in  30 minutes so always push to the level where you are short of breath.    Please schedule a follow up visit in 3 months but call sooner if needed

## 2019-12-11 ENCOUNTER — Encounter (INDEPENDENT_AMBULATORY_CARE_PROVIDER_SITE_OTHER): Payer: Self-pay | Admitting: *Deleted

## 2019-12-11 ENCOUNTER — Encounter (INDEPENDENT_AMBULATORY_CARE_PROVIDER_SITE_OTHER): Payer: Self-pay | Admitting: Gastroenterology

## 2019-12-11 ENCOUNTER — Other Ambulatory Visit (INDEPENDENT_AMBULATORY_CARE_PROVIDER_SITE_OTHER): Payer: Self-pay | Admitting: *Deleted

## 2019-12-11 ENCOUNTER — Ambulatory Visit (INDEPENDENT_AMBULATORY_CARE_PROVIDER_SITE_OTHER): Payer: BC Managed Care – PPO | Admitting: Gastroenterology

## 2019-12-11 ENCOUNTER — Other Ambulatory Visit: Payer: Self-pay

## 2019-12-11 VITALS — BP 130/83 | HR 78 | Temp 98.6°F | Ht 62.0 in | Wt 241.6 lb

## 2019-12-11 DIAGNOSIS — R1010 Upper abdominal pain, unspecified: Secondary | ICD-10-CM | POA: Diagnosis not present

## 2019-12-11 DIAGNOSIS — R932 Abnormal findings on diagnostic imaging of liver and biliary tract: Secondary | ICD-10-CM

## 2019-12-11 DIAGNOSIS — D732 Chronic congestive splenomegaly: Secondary | ICD-10-CM

## 2019-12-11 MED ORDER — HYOSCYAMINE SULFATE 0.125 MG SL SUBL: 0.1250 mg | SUBLINGUAL_TABLET | SUBLINGUAL | 0 refills | Status: DC | PRN

## 2019-12-11 NOTE — Progress Notes (Addendum)
Samantha Turner, M.D. Gastroenterology & Hepatology [facility] [address] [place], [place] [address]  [address] [place] [address]  Referring MD: Dr. Charlaine Dalton. [name], MD  I will communicate my assessment and recommendations to the referring MD via EMR. "Note: Occasional unusual wording and randomly placed punctuation marks may result from the use of speech recognition technology to transcribe this document"  Chief Complaint: Evaluation for congestive hepatomegaly and possible cirrhosis  History of Present Illness: Samantha Turner is a 59 y.o. 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 fib status post RFA, who presents for evaluation of congestive splenomegaly and evaluation for possible cirrhosis.  The patient reports that she presented episodes of left-sided abdominal pain since March 2021 which radiate to her chest and neck, described as an pressure, up to 7 out of 10 intensity that occasionally wake her up during the middle of the night.  She also endorsed having dry heaves 3-4 times a week.  Due to this she has been taking ibuprofen 3 times a week 1 tablet.  She never had similar symptoms in the past.   Otherwise, the patient denies having any nausea, vomiting, fever, chills, hematochezia, melena, hematemesis, abdominal distention, diarrhea, jaundice, pruritus or weight changes. Patient denies having any episodes of confusion, ascites.   Per notes from referring physician, the patient had a CT scan of the abdomen at [facility] in 08/29/2019 showed findings compatible with liver cirrhosis, congestive splenomegaly. Most recent labs from 10/21/2019 showed sodium 135, potassium 3.5, glucose 307, alkaline phos is 144, albumin 3.7, AST 17, ALT 14, direct bilirubin  total bilirubin 0.8, BNP 51, iron 57, ferritin 20, TIBC 416, alpha-1 antitrypsin 160 (normal value), however cell count 6.4, hemoglobin 11.5, platelets 167, TSH 7.7.  I received records from [facility] health care from 08/29/2019, CT angiography chest with contrast performed for evaluation of pulmonary embolism, where she was found to have irregular hepatic contour suggestive of cirrhosis in the upper abdomen and splenomegaly of 16.1 x 6.5 cm in the upper abdomen.  Due to this, she subsequently underwent an ultrasound of the abdomen on 10/15/2019 which showed hepatosplenomegaly with evidence of hepatic acidosis and nodularity suspicious for cirrhosis but no liver lesion was identified.  No ascites was present.  The patient also underwent same day a SPECT cardiac testing which was negative for reversible ischemia or infarction, left ventricular ejection fraction was 68%.  Last EGD: never Last Colonoscopy: 3 years ago at [facility] -- normal per patient  FHx: neg for any gastrointestinal/liver disease, father bladder cancer Social: neg smoking, significant alcohol or illicit drug use Surgical cholecystectomy, hysterectomy  Past Medical History: Past Medical History:  Diagnosis Date  . Anxiety   . Bronchogenic cyst   . Depression   . Essential hypertension   . Headache   . History of cardiac catheterization    Normal coronary arteries July 2017  . Nonischemic cardiomyopathy (HCC)   . Obstructive sleep apnea   . Paroxysmal atrial flutter (HCC)    RFA 07/2016 - Dr. Johney Frame    Past Surgical History: Past Surgical History:  Procedure Laterality Date  . A-FLUTTER ABLATION N/A 07/26/2016   Procedure: A-Flutter Ablation;  Surgeon: Hillis Range, MD;  Location: [facility] INVASIVE CV LAB;  Service: Cardiovascular;  Laterality: N/A;  . ABDOMINAL HYSTERECTOMY    . CARDIAC CATHETERIZATION N/A 12/16/2015  Procedure: Left Heart Cath and Coronary Angiography;  Surgeon: Iran Ouch, MD;  Location: [facility]  INVASIVE CV LAB;  Service: Cardiovascular;  Laterality: N/A;  . CARDIOVERSION N/A 12/18/2015   Procedure: CARDIOVERSION;  Surgeon: Lars Masson, MD;  Location: [facility] ENDOSCOPY;  Service: Cardiovascular;  Laterality: N/A;  . CHOLECYSTECTOMY    . COLONOSCOPY     About 3 years ago at [facility]  . TEE WITHOUT CARDIOVERSION N/A 12/18/2015   Procedure: TRANSESOPHAGEAL ECHOCARDIOGRAM (TEE);  Surgeon: Lars Masson, MD;  Location: [facility] ENDOSCOPY;  Service: Cardiovascular;  Laterality: N/A;  . TUMOR REMOVAL  07/02/2015   ovarian    Family History: Family History  Problem Relation Age of Onset  . Bladder Cancer Father   . Heart failure Mother   . COPD Mother   . Congestive Heart Failure Mother   . Thyroid disease Sister   . Suicidality Brother   . Heart disease Sister   . Healthy Brother   . Heart disease Brother   . Thyroid disease Daughter   . Healthy Daughter     Social History: Social History   Tobacco Use  Smoking Status Never Smoker  Smokeless Tobacco Never Used   Social History   Substance and Sexual Activity  Alcohol Use Yes   Comment: rarel   Social History   Substance and Sexual Activity  Drug Use No    Allergies: No Known Allergies  Medications: Current Outpatient Medications  Medication Sig Dispense Refill  . [START ON 01/31/2020] ARIPiprazole (ABILIFY) 5 MG tablet Take 1 tablet (5 mg total) by mouth daily. 90 tablet 0  . atorvastatin (LIPITOR) 40 MG tablet TAKE 1 TABLET BY MOUTH DAILY AT 6PM 90 tablet 2  . diazepam (VALIUM) 2 MG tablet Take 0.5 tablets (1 mg total) by mouth daily as needed for anxiety. 15 tablet 3  . furosemide (LASIX) 20 MG tablet Take 10 mg by mouth daily.    Marland Kitchen gabapentin (NEURONTIN) 100 MG capsule Take 100 mg by mouth at bedtime.    . Multiple Vitamins-Minerals (ONE-A-DAY WOMENS 50+ ADVANTAGE PO) Take by mouth daily.    Melene Muller ON 01/31/2020] venlafaxine XR (EFFEXOR-XR) 150 MG 24 hr capsule Take 1 capsule (150 mg total) by mouth  daily with breakfast. 90 capsule 0  . VENTOLIN HFA 108 (90 Base) MCG/ACT inhaler Inhale 1-2 puffs into the lungs every 6 (six) hours as needed for shortness of breath or wheezing.  0   No current facility-administered medications for this visit.    Review of Systems: GENERAL: negative for malaise, significant weight loss, night sweats and fever HEENT: No changes in hearing or vision, no nose bleeds or other nasal problems. No trouble swallowing NECK: Negative for lumps, goiter, pain and significant neck swelling RESPIRATORY: Negative for cough, wheezing and shortness of breath CARDIOVASCULAR: Negative for chest pain, leg swelling, palpitations, orthopnea GI: SEE HPI MUSCULOSKELETAL: Negative for joint pain or swelling, back pain, and muscle pain. SKIN: Negative for lesions, rash, and itching. PSYCH: Negative for sleep disturbance, mood disorder and recent psychosocial stressors. HEMATOLOGY Negative for prolonged bleeding, bruising easily, and swollen nodes. ENDOCRINE: Negative for cold or heat intolerance, polyuria, polydipsia and goiter. NEURO: negative for lightheadedness, dizziness, tremor, gait imbalance, syncope and seizures. The remainder of the review of systems is noncontributory.   Physical Exam: BP 130/83 (BP Location: Right Arm, Patient Position: Sitting, Cuff Size: Large)   Pulse 78   Temp 98.6 F (37 C) (Oral)   Ht 5\' 2"  (1.575 m)  Wt 241 lb 9.6 oz (109.6 kg)   BMI 44.19 kg/m  GENERAL: The patient is AO x3, in no acute distress. Obese. HEENT: Head is normocephalic and atraumatic. EOMI are intact. Mouth is well hydrated and without lesions. NECK: Supple. No masses LUNGS: Clear to auscultation. No presence of rhonchi/wheezing/rales. Adequate chest expansion HEART: RRR, normal s1 and s2. ABDOMEN: mildly tender to palpation diffusely but worse on left side of the abdomen, no guarding, no peritoneal signs, and nondistended. BS +.  Nodular liver is palpated in RUQ,  splenomegaly could not be evaluated. EXTREMITIES: Without any cyanosis, clubbing, rash, lesions or edema. NEUROLOGIC: AOx3, no focal motor deficit. SKIN: no jaundice, no rashes   Imaging/Labs: as above  I personally reviewed and interpreted the available labs, imaging and endoscopic files.  Impression and Plan: Emilina Smarr is a 59 y.o. 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 fib status post RFA, who presents for evaluation of congestive splenomegaly and evaluation for possible cirrhosis.  Regarding her abdominal pain, the patient has presented new onset of recurrent episodes of abdominal pain without any clear explanation.  Most recent ultrasound of the abdomen was unremarkable to identify a source of this pain, also cardiac causes of radiating chest pain were ruled out with nuclear stress testing.  It is possible that her symptoms are related to bowel hypersensitivity/IBS for which she will benefit of taking an antispasmodic as needed such as Levsin.  Nevertheless, we will proceed with an EGD to evaluate other causes of left-sided abdominal pain.  On the other hand, the patient was found to have incidental alterations in imaging concerning for liver cirrhosis given liver nodularity and splenomegaly.  Her hepatic function seems to be intact at this point but we will evaluate this further with an INR check.  We will also evaluate if she has indeed liver fibrosis with a liver elastography.  Regardless, if she has liver cirrhosis her disease seems to be compensated at this point.  It is most likely related to fatty liver, but other causes of liver fibrosis will be evaluated if her elastography is positive for fibrosis.  An AFP will be checked at this point.  - Schedule EGD - Check INR and AFP - Schedule elastography -  Will evaluate for reversible causes of cirrhosis if she has significant fibrosis on imaging - Take Lomotil as needed for episode of diarrhea - Take Levsin as needed for abdominal pain - RTC 6 weeks  All questions were answered.      Dolores Frame, MD

## 2019-12-11 NOTE — Patient Instructions (Signed)
Schedule EGD Perform blood work-up Schedule elastography Obtain records from CT scan performed at outside facility Take Lomotil as needed for episode of diarrhea Take Levsin as needed for abdominal pain

## 2019-12-12 ENCOUNTER — Ambulatory Visit: Payer: BC Managed Care – PPO | Admitting: Gastroenterology

## 2019-12-12 LAB — AFP TUMOR MARKER: AFP-Tumor Marker: 3.6 ng/mL

## 2019-12-12 LAB — PROTIME-INR
INR: 1.1
Prothrombin Time: 11.4 s (ref 9.0–11.5)

## 2019-12-18 ENCOUNTER — Telehealth (INDEPENDENT_AMBULATORY_CARE_PROVIDER_SITE_OTHER): Payer: Self-pay | Admitting: Gastroenterology

## 2019-12-18 ENCOUNTER — Other Ambulatory Visit: Payer: Self-pay

## 2019-12-18 ENCOUNTER — Ambulatory Visit (HOSPITAL_COMMUNITY)
Admission: RE | Admit: 2019-12-18 | Discharge: 2019-12-18 | Disposition: A | Discharge status: Home / Self Care | Payer: BC Managed Care – PPO | Source: Ambulatory Visit | Attending: Gastroenterology | Admitting: Gastroenterology

## 2019-12-18 DIAGNOSIS — R932 Abnormal findings on diagnostic imaging of liver and biliary tract: Secondary | ICD-10-CM | POA: Insufficient documentation

## 2019-12-18 DIAGNOSIS — D732 Chronic congestive splenomegaly: Secondary | ICD-10-CM | POA: Insufficient documentation

## 2019-12-18 DIAGNOSIS — K760 Fatty (change of) liver, not elsewhere classified: Secondary | ICD-10-CM | POA: Diagnosis not present

## 2019-12-18 NOTE — Telephone Encounter (Signed)
Called patient and husband to discuss results of lab tests which showed normal INR and AFP.  Overall her liver synthetic function tests have been normal which is not consistent with liver cirrhosis.  Also, the elastography did not show any significant fibrosis with a median kPa of 7.8.  There was presence of fatty liver in imaging for which the patient was counseled to lose at least 5% of her weight and to implement that Mediterranean diet.  She will proceed with EGD as previously scheduled.   Dolores Frame, MD Gastroenterology and Hepatology [facility]

## 2019-12-27 NOTE — Patient Instructions (Signed)
Fe Okubo  12/27/2019     @PREFPERIOPPHARMACY @   Your procedure is scheduled on  01/03/2020.  Report to 01/05/2020 at  [phone]  A.M.  Call this number if you have problems the morning of surgery:  [phone]   Remember:  Follow the diet instructions given to you by Dr [phone] office.                     Take these medicines the morning of surgery with A SIP OF WATER  Abilify, valium(if needed), gabapentin, effexor.    Do not wear jewelry, make-up or nail polish.  Do not wear lotions, powders, or perfumes, or deodorant. Please wear deodorant and brush your teeth.  Do not shave 48 hours prior to surgery.  Men may shave face and neck.  Do not bring valuables to the hospital.  [facility] is not responsible for any belongings or valuables.  Contacts, dentures or bridgework may not be worn into surgery.  Leave your suitcase in the car.  After surgery it may be brought to your room.  For patients admitted to the hospital, discharge time will be determined by your treatment team.  Patients discharged the day of surgery will not be allowed to drive home.   Name and phone number of your driver:   family Special instructions:  DO NOT smoke the morning of your procedure.  Please read over the following fact sheets that you were given. Anesthesia Post-op Instructions and Care and Recovery After Surgery       Upper Endoscopy, Adult, Care After This sheet gives you information about how to care for yourself after your procedure. Your health care provider may also give you more specific instructions. If you have problems or questions, contact your health care provider. What can I expect after the procedure? After the procedure, it is common to have:  A sore throat.  Mild stomach pain or discomfort.  Bloating.  Nausea. Follow these instructions at home:   Follow instructions from your health care provider about what to eat or drink after your  procedure.  Return to your normal activities as told by your health care provider. Ask your health care provider what activities are safe for you.  Take over-the-counter and prescription medicines only as told by your health care provider.  Do not drive for 24 hours if you were given a sedative during your procedure.  Keep all follow-up visits as told by your health care provider. This is important. Contact a health care provider if you have:  A sore throat that lasts longer than one day.  Trouble swallowing. Get help right away if:  You vomit blood or your vomit looks like coffee grounds.  You have: ? A fever. ? Bloody, black, or tarry stools. ? A severe sore throat or you cannot swallow. ? Difficulty breathing. ? Severe pain in your chest or abdomen. Summary  After the procedure, it is common to have a sore throat, mild stomach discomfort, bloating, and nausea.  Do not drive for 24 hours if you were given a sedative during the procedure.  Follow instructions from your health care provider about what to eat or drink after your procedure.  Return to your normal activities as told by your health care provider. This information is not intended to replace advice given to you by your health care provider. Make sure you discuss any questions you have with your health care provider. Document  Revised: 11/14/2017 Document Reviewed: 10/23/2017 Elsevier Patient Education  2020 [employer] Monitored Anesthesia Care, Care After These instructions provide you with information about caring for yourself after your procedure. Your health care provider may also give you more specific instructions. Your treatment has been planned according to current medical practices, but problems sometimes occur. Call your health care provider if you have any problems or questions after your procedure. What can I expect after the procedure? After your procedure, you may:  Feel sleepy for several  hours.  Feel clumsy and have poor balance for several hours.  Feel forgetful about what happened after the procedure.  Have poor judgment for several hours.  Feel nauseous or vomit.  Have a sore throat if you had a breathing tube during the procedure. Follow these instructions at home: For at least 24 hours after the procedure:      Have a responsible adult stay with you. It is important to have someone help care for you until you are awake and alert.  Rest as needed.  Do not: ? Participate in activities in which you could fall or become injured. ? Drive. ? Use heavy machinery. ? Drink alcohol. ? Take sleeping pills or medicines that cause drowsiness. ? Make important decisions or sign legal documents. ? Take care of children on your own. Eating and drinking  Follow the diet that is recommended by your health care provider.  If you vomit, drink water, juice, or soup when you can drink without vomiting.  Make sure you have little or no nausea before eating solid foods. General instructions  Take over-the-counter and prescription medicines only as told by your health care provider.  If you have sleep apnea, surgery and certain medicines can increase your risk for breathing problems. Follow instructions from your health care provider about wearing your sleep device: ? Anytime you are sleeping, including during daytime naps. ? While taking prescription pain medicines, sleeping medicines, or medicines that make you drowsy.  If you smoke, do not smoke without supervision.  Keep all follow-up visits as told by your health care provider. This is important. Contact a health care provider if:  You keep feeling nauseous or you keep vomiting.  You feel light-headed.  You develop a rash.  You have a fever. Get help right away if:  You have trouble breathing. Summary  For several hours after your procedure, you may feel sleepy and have poor judgment.  Have a  responsible adult stay with you for at least 24 hours or until you are awake and alert. This information is not intended to replace advice given to you by your health care provider. Make sure you discuss any questions you have with your health care provider. Document Revised: 08/21/2017 Document Reviewed: 09/13/2015 Elsevier Patient Education  2020 [employer].

## 2020-01-01 ENCOUNTER — Other Ambulatory Visit (HOSPITAL_COMMUNITY)
Admission: RE | Admit: 2020-01-01 | Discharge: 2020-01-01 | Disposition: A | Discharge status: Home / Self Care | Payer: BC Managed Care – PPO | Source: Ambulatory Visit | Attending: Gastroenterology | Admitting: Gastroenterology

## 2020-01-01 ENCOUNTER — Other Ambulatory Visit: Payer: Self-pay

## 2020-01-01 ENCOUNTER — Encounter (HOSPITAL_COMMUNITY): Payer: Self-pay

## 2020-01-01 ENCOUNTER — Encounter (HOSPITAL_COMMUNITY)
Admission: RE | Admit: 2020-01-01 | Discharge: 2020-01-01 | Disposition: A | Discharge status: Home / Self Care | Payer: BC Managed Care – PPO | Source: Ambulatory Visit | Attending: Gastroenterology | Admitting: Gastroenterology

## 2020-01-01 ENCOUNTER — Telehealth: Payer: Self-pay | Admitting: Cardiology

## 2020-01-01 DIAGNOSIS — Z01818 Encounter for other preprocedural examination: Secondary | ICD-10-CM | POA: Diagnosis not present

## 2020-01-01 DIAGNOSIS — Z20822 Contact with and (suspected) exposure to covid-19: Secondary | ICD-10-CM | POA: Insufficient documentation

## 2020-01-01 LAB — BASIC METABOLIC PANEL
Anion gap: 12 (ref 5–15)
BUN: 7 mg/dL (ref 6–20)
CO2: 24 mmol/L (ref 22–32)
Calcium: 9.3 mg/dL (ref 8.9–10.3)
Chloride: 98 mmol/L (ref 98–111)
Creatinine, Ser: 0.71 mg/dL (ref 0.44–1.00)
GFR calc Af Amer: >60 mL/min (ref >60)
GFR calc non Af Amer: >60 mL/min (ref >60)
Glucose, Bld: 295 mg/dL — ABNORMAL HIGH (ref 70–99)
Potassium: 3.3 mmol/L — ABNORMAL LOW (ref 3.5–5.1)
Sodium: 134 mmol/L — ABNORMAL LOW (ref 135–145)

## 2020-01-01 LAB — SARS CORONAVIRUS 2 (TAT 6-24 HRS): SARS Coronavirus 2: NEGATIVE

## 2020-01-01 NOTE — Telephone Encounter (Signed)
[phone] if no answer at first number       [facility] Pre-operative Risk Assessment    HEARTCARE STAFF: - Please ensure there is not already an duplicate clearance open for this procedure. - Under Visit Info/Reason for Call, type in Other and utilize the format Clearance MM/DD/YY or Clearance TBD. Do not use dashes or single digits. - If request is for dental extraction, please clarify the # of teeth to be extracted.  Request for surgical clearance:  1. What type of surgery is being performed? Upper egd   2. When is this surgery scheduled? 7/30  3. What type of clearance is required (medical clearance vs. Pharmacy clearance to hold med vs. Both)? Cardiac clearance  4. Are there any medications that need to be held prior to surgery and how long?no  5. Practice name and name of physician performing surgery? [facility]   6. What is the office phone number?  [phone]   7.   What is the office fax number?  [phone]  8.   Anesthesia type (None, local, MAC, general) ? general   Jannet Askew 01/01/2020, 10:17 AM  _________________________________________________________________   (provider comments below)

## 2020-01-01 NOTE — Telephone Encounter (Signed)
Left message to call back.  Corine Shelter PA-C 01/01/2020 11:04 AM

## 2020-01-02 NOTE — Telephone Encounter (Signed)
° °  Primary Cardiologist: Nona Dell, MD  Chart reviewed as part of pre-operative protocol coverage. Given past medical history and time since last visit, based on ACC/AHA guidelines, Samantha Turner would be at acceptable risk for the planned procedure without further cardiovascular testing.   I will route this recommendation to the requesting party via Epic fax function and remove from pre-op pool.  Please call with questions.  Thomasene Ripple. Tykeem Lanzer NP-C    01/02/2020, 11:17 AM [facility] [address] Office [phone] Fax [phone]

## 2020-01-02 NOTE — Telephone Encounter (Signed)
Laquana is returning Jesse's call.

## 2020-01-03 ENCOUNTER — Ambulatory Visit (HOSPITAL_COMMUNITY): Payer: BC Managed Care – PPO | Admitting: Anesthesiology

## 2020-01-03 ENCOUNTER — Encounter (HOSPITAL_COMMUNITY)
Admission: RE | Disposition: A | Discharge status: Home / Self Care | Payer: Self-pay | Source: Home / Self Care | Attending: Gastroenterology

## 2020-01-03 ENCOUNTER — Ambulatory Visit (HOSPITAL_COMMUNITY)
Admission: RE | Admit: 2020-01-03 | Discharge: 2020-01-03 | Disposition: A | Discharge status: Home / Self Care | Payer: BC Managed Care – PPO | Attending: Gastroenterology | Admitting: Gastroenterology

## 2020-01-03 DIAGNOSIS — G4733 Obstructive sleep apnea (adult) (pediatric): Secondary | ICD-10-CM | POA: Insufficient documentation

## 2020-01-03 DIAGNOSIS — E1165 Type 2 diabetes mellitus with hyperglycemia: Secondary | ICD-10-CM | POA: Diagnosis not present

## 2020-01-03 DIAGNOSIS — I509 Heart failure, unspecified: Secondary | ICD-10-CM | POA: Insufficient documentation

## 2020-01-03 DIAGNOSIS — K317 Polyp of stomach and duodenum: Secondary | ICD-10-CM | POA: Diagnosis not present

## 2020-01-03 DIAGNOSIS — R7309 Other abnormal glucose: Secondary | ICD-10-CM | POA: Diagnosis not present

## 2020-01-03 DIAGNOSIS — E1129 Type 2 diabetes mellitus with other diabetic kidney complication: Secondary | ICD-10-CM | POA: Diagnosis not present

## 2020-01-03 DIAGNOSIS — Z79899 Other long term (current) drug therapy: Secondary | ICD-10-CM | POA: Diagnosis not present

## 2020-01-03 DIAGNOSIS — R1010 Upper abdominal pain, unspecified: Secondary | ICD-10-CM

## 2020-01-03 DIAGNOSIS — I428 Other cardiomyopathies: Secondary | ICD-10-CM | POA: Insufficient documentation

## 2020-01-03 DIAGNOSIS — R1013 Epigastric pain: Secondary | ICD-10-CM | POA: Insufficient documentation

## 2020-01-03 DIAGNOSIS — K2950 Unspecified chronic gastritis without bleeding: Secondary | ICD-10-CM | POA: Insufficient documentation

## 2020-01-03 DIAGNOSIS — F419 Anxiety disorder, unspecified: Secondary | ICD-10-CM | POA: Diagnosis not present

## 2020-01-03 DIAGNOSIS — K3189 Other diseases of stomach and duodenum: Secondary | ICD-10-CM | POA: Insufficient documentation

## 2020-01-03 DIAGNOSIS — E119 Type 2 diabetes mellitus without complications: Secondary | ICD-10-CM | POA: Diagnosis not present

## 2020-01-03 DIAGNOSIS — Z299 Encounter for prophylactic measures, unspecified: Secondary | ICD-10-CM | POA: Diagnosis not present

## 2020-01-03 DIAGNOSIS — Z6839 Body mass index (BMI) 39.0-39.9, adult: Secondary | ICD-10-CM | POA: Diagnosis not present

## 2020-01-03 DIAGNOSIS — F329 Major depressive disorder, single episode, unspecified: Secondary | ICD-10-CM | POA: Insufficient documentation

## 2020-01-03 DIAGNOSIS — K259 Gastric ulcer, unspecified as acute or chronic, without hemorrhage or perforation: Secondary | ICD-10-CM | POA: Diagnosis not present

## 2020-01-03 DIAGNOSIS — I4891 Unspecified atrial fibrillation: Secondary | ICD-10-CM | POA: Diagnosis not present

## 2020-01-03 HISTORY — PX: ESOPHAGOGASTRODUODENOSCOPY (EGD) WITH PROPOFOL: SHX5813

## 2020-01-03 HISTORY — PX: BIOPSY: SHX5522

## 2020-01-03 LAB — GLUCOSE, CAPILLARY
Glucose-Capillary: 325 mg/dL — ABNORMAL HIGH (ref 70–99)
Glucose-Capillary: 290 mg/dL — ABNORMAL HIGH (ref 70–99)
Glucose-Capillary: 263 mg/dL — ABNORMAL HIGH (ref 70–99)

## 2020-01-03 SURGERY — ESOPHAGOGASTRODUODENOSCOPY (EGD) WITH PROPOFOL
Anesthesia: General

## 2020-01-03 MED ORDER — PROPOFOL 10 MG/ML IV BOLUS
INTRAVENOUS | Status: AC
  Filled 2020-01-03: qty 60

## 2020-01-03 MED ORDER — HYOSCYAMINE SULFATE 0.125 MG SL SUBL: 0.1250 mg | SUBLINGUAL_TABLET | Freq: Four times a day (QID) | SUBLINGUAL | 0 refills | Status: DC | PRN

## 2020-01-03 MED ORDER — PHENYLEPHRINE HCL (PRESSORS) 10 MG/ML IV SOLN
INTRAVENOUS | Status: DC | PRN
Start: 2020-01-03 — End: 2020-01-03
  Administered 2020-01-03: 200 ug via INTRAVENOUS

## 2020-01-03 MED ORDER — GLYCOPYRROLATE 0.2 MG/ML IJ SOLN
0.2000 mg | Freq: Once | INTRAMUSCULAR | Status: AC
  Administered 2020-01-03: 0.2 mg via INTRAVENOUS
  Filled 2020-01-03: qty 1

## 2020-01-03 MED ORDER — LIDOCAINE VISCOUS HCL 2 % MT SOLN
OROMUCOSAL | Status: AC
  Filled 2020-01-03: qty 15

## 2020-01-03 MED ORDER — LACTATED RINGERS IV SOLN: INTRAVENOUS | Status: DC | PRN

## 2020-01-03 MED ORDER — PROPOFOL 10 MG/ML IV BOLUS
INTRAVENOUS | Status: DC | PRN
  Administered 2020-01-03: 40 mg via INTRAVENOUS
  Administered 2020-01-03: 60 mg via INTRAVENOUS

## 2020-01-03 MED ORDER — PROPOFOL 500 MG/50ML IV EMUL
INTRAVENOUS | Status: DC | PRN
  Administered 2020-01-03: 200 ug/kg/min via INTRAVENOUS

## 2020-01-03 MED ORDER — INSULIN ASPART 100 UNIT/ML ~~LOC~~ SOLN
8.0000 [IU] | Freq: Once | SUBCUTANEOUS | Status: AC
  Administered 2020-01-03: 8 [IU] via SUBCUTANEOUS
  Filled 2020-01-03: qty 0.08

## 2020-01-03 MED ORDER — LIDOCAINE VISCOUS HCL 2 % MT SOLN
15.0000 mL | Freq: Once | OROMUCOSAL | Status: AC
  Administered 2020-01-03: 15 mL via OROMUCOSAL

## 2020-01-03 NOTE — Anesthesia Preprocedure Evaluation (Addendum)
Anesthesia Evaluation  Patient identified by MRN, date of birth, ID band Patient awake    Reviewed: Allergy & Precautions, NPO status , Patient's Chart, lab work & pertinent test results  History of Anesthesia Complications Negative for: history of anesthetic complications  Airway Mallampati: II  TM Distance: >3 FB Neck ROM: Full    Dental  (+) Edentulous Upper, Missing   Pulmonary shortness of breath and with exertion, sleep apnea ,    Pulmonary exam normal breath sounds clear to auscultation       Cardiovascular Exercise Tolerance: Good hypertension (not on meds, denied h/o HTN), +CHF and + DOE   Rhythm:Regular Rate:Normal  01-Jan-2020 10:12:44 [facility] ROUTINE RECORD Normal sinus rhythm Low voltage QRS Nonspecific T wave abnormality Prolonged QT Abnormal ECG Atrial flutter RESOLVED SINCE PREVIOUS Confirmed by Olga Millers ([id number]) on 01/01/2020 10:30:02 AM   Neuro/Psych  Headaches, PSYCHIATRIC DISORDERS Anxiety Depression    GI/Hepatic negative GI ROS, Neg liver ROS,   Endo/Other  Hypothyroidism   Renal/GU negative Renal ROS     Musculoskeletal negative musculoskeletal ROS (+)   Abdominal   Peds  Hematology negative hematology ROS (+)   Anesthesia Other Findings Left ventricle: The cavity size was normal. Wall thickness was normal. The estimated ejection fraction was 55%. Wall motion was normal; there were no regional wall motion abnormalities. Left  ventricular diastolic function parameters were normal.  - Aortic valve: Mildly calcified annulus. Trileaflet.  - Mitral valve: There was trivial regurgitation.  - Left atrium: The atrium was at the upper limits of normal in  size.  - Right atrium: Central venous pressure (est): 3 mm Hg.  - Tricuspid valve: There was trivial regurgitation.  - Pulmonary arteries: Systolic pressure could not be accurately  estimated.  -  Pericardium, extracardiac: There was no pericardial effusion.   Reproductive/Obstetrics negative OB ROS                          Anesthesia Physical Anesthesia Plan  ASA: III  Anesthesia Plan: General   Post-op Pain Management:    Induction: Intravenous  PONV Risk Score and Plan: 2  Airway Management Planned: Nasal Cannula, Natural Airway and Simple Face Mask  Additional Equipment:   Intra-op Plan:   Post-operative Plan:   Informed Consent: I have reviewed the patients History and Physical, chart, labs and discussed the procedure including the risks, benefits and alternatives for the proposed anesthesia with the patient or authorized representative who has indicated his/her understanding and acceptance.     Dental advisory given  Plan Discussed with: CRNA and Surgeon  Anesthesia Plan Comments: (preop blood sugar  325, 8 UNITS of regular insulin [place] was given, will retest blood sugar before the procedure.)      Anesthesia Quick Evaluation

## 2020-01-03 NOTE — Transfer of Care (Signed)
Immediate Anesthesia Transfer of Care Note  Patient: Samantha Turner  Procedure(s) Performed: ESOPHAGOGASTRODUODENOSCOPY (EGD) WITH PROPOFOL (N/A ) BIOPSY  Patient Location: PACU  Anesthesia Type:General  Level of Consciousness: drowsy, patient cooperative and responds to stimulation  Airway & Oxygen Therapy: Patient Spontanous Breathing and Patient connected to nasal cannula oxygen  Post-op Assessment: Report given to RN and Post -op Vital signs reviewed and stable  Post vital signs: Reviewed and stable  Last Vitals:  Vitals Value Taken Time  BP 95/51 01/03/20 0804  Temp    Pulse 75 01/03/20 0805  Resp 18 01/03/20 0805  SpO2 95 % 01/03/20 0805  Vitals shown include unvalidated device data.  Last Pain:  Vitals:   01/03/20 0743  PainSc: 0-No pain         Complications: No complications documented.

## 2020-01-03 NOTE — Anesthesia Postprocedure Evaluation (Signed)
Anesthesia Post Note  Patient: Samantha Turner  Procedure(s) Performed: ESOPHAGOGASTRODUODENOSCOPY (EGD) WITH PROPOFOL (N/A ) BIOPSY  Patient location during evaluation: PACU Anesthesia Type: General Level of consciousness: awake and alert and oriented Pain management: pain level controlled Vital Signs Assessment: post-procedure vital signs reviewed and stable Respiratory status: spontaneous breathing and respiratory function stable Cardiovascular status: blood pressure returned to baseline and stable Postop Assessment: no apparent nausea or vomiting Anesthetic complications: no   No complications documented.   Last Vitals:  Vitals:   01/03/20 0815 01/03/20 0837  BP: (!) 94/52 113/71  Pulse: 72 71  Resp: (!) 24 18  Temp:  36.8 C  SpO2: 96% 95%    Last Pain:  Vitals:   01/03/20 0837  TempSrc: Oral  PainSc: 0-No pain                 Zia Najera C Arriyah Madej

## 2020-01-03 NOTE — Progress Notes (Signed)
Talked with Samantha Turner at Dr Sherryll Burger office. Appt made for Samantha Turner for today (01/03/20) at 0915. Re: elevated blood sugar. Pt instructed to go to Dr Sherryll Burger office when she leaves hospital. Copy of labs and blood sugar results sent with pt. Instructed pt to give lab results to Dr Sherryll Burger. Voiced understanding.

## 2020-01-03 NOTE — H&P (Signed)
Samantha Turner is an 59 y.o. female.   Chief Complaint: Abdominal pain HPI: Samantha Turner is a 59 y.o. 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 fib status post RFA, coming to the hospital to undergo EGD for evaluation of left-sided abdominal pain since March 2021.  The patient has taken NSAIDs without major relief of her symptoms.  Has not taken any antispasmodic.  Also had some episode of dry heaves but no vomiting.  She had abdominal imaging including CT scan and ultrasound that showed possible borderline splenomegaly and questionable nodularity in liver but no other findings.  Had liver elastography recently that was negative for cirrhosis.  We will proceed with EGD to rule out organic causes of upper abdominal pain.  Past Medical History:  Diagnosis Date  . Anxiety   . Bronchogenic cyst   . Depression   . Essential hypertension   . Headache   . History of cardiac catheterization    Normal coronary arteries July 2017  . Nonischemic cardiomyopathy (HCC)   . Obstructive sleep apnea   . Paroxysmal atrial flutter (HCC)    RFA 07/2016 - Dr. Johney Frame    Past Surgical History:  Procedure Laterality Date  . A-FLUTTER ABLATION N/A 07/26/2016   Procedure: A-Flutter Ablation;  Surgeon: Hillis Range, MD;  Location: [facility] INVASIVE CV LAB;  Service: Cardiovascular;  Laterality: N/A;  . ABDOMINAL HYSTERECTOMY    . CARDIAC CATHETERIZATION N/A 12/16/2015   Procedure: Left Heart Cath and Coronary Angiography;  Surgeon: Iran Ouch, MD;  Location: [facility] INVASIVE CV LAB;  Service: Cardiovascular;  Laterality: N/A;  . CARDIOVERSION N/A 12/18/2015   Procedure: CARDIOVERSION;  Surgeon: Lars Masson, MD;  Location: [facility] ENDOSCOPY;  Service: Cardiovascular;  Laterality: N/A;  . CHOLECYSTECTOMY    . COLONOSCOPY     About 3 years ago  at [facility]  . TEE WITHOUT CARDIOVERSION N/A 12/18/2015   Procedure: TRANSESOPHAGEAL ECHOCARDIOGRAM (TEE);  Surgeon: Lars Masson, MD;  Location: [facility] ENDOSCOPY;  Service: Cardiovascular;  Laterality: N/A;  . TUMOR REMOVAL  07/02/2015   ovarian    Family History  Problem Relation Age of Onset  . Bladder Cancer Father   . Heart failure Mother   . COPD Mother   . Congestive Heart Failure Mother   . Thyroid disease Sister   . Suicidality Brother   . Heart disease Sister   . Healthy Brother   . Heart disease Brother   . Thyroid disease Daughter   . Healthy Daughter    Social History:  reports that she has never smoked. She has never used smokeless tobacco. She reports current alcohol use. She reports that she does not use drugs.  Allergies: No Known Allergies  Medications Prior to Admission  Medication Sig Dispense Refill  . [START ON 01/31/2020] ARIPiprazole (ABILIFY) 5 MG tablet Take 1 tablet (5 mg total) by mouth daily. 90 tablet 0  . atorvastatin (LIPITOR) 40 MG tablet TAKE 1 TABLET BY MOUTH DAILY AT 6PM (Patient taking differently: Take 40 mg by mouth daily. ) 90 tablet 2  . diazepam (VALIUM) 2 MG tablet Take 0.5 tablets (1 mg total) by mouth daily as needed for anxiety. 15 tablet 3  . furosemide (LASIX) 20 MG tablet Take 10 mg by mouth daily.    Marland Kitchen gabapentin (NEURONTIN) 100 MG capsule Take 100 mg by mouth at bedtime.    Melene Muller  ON 01/31/2020] venlafaxine XR (EFFEXOR-XR) 150 MG 24 hr capsule Take 1 capsule (150 mg total) by mouth daily with breakfast. 90 capsule 0  . VENTOLIN HFA 108 (90 Base) MCG/ACT inhaler Inhale 1-2 puffs into the lungs every 6 (six) hours as needed for shortness of breath or wheezing.  0  . hyoscyamine (LEVSIN SL) 0.125 MG SL tablet Place 1 tablet (0.125 mg total) under the tongue every 4 (four) hours as needed. (Patient not taking: Reported on 12/19/2019) 30 tablet 0  . Multiple Vitamins-Minerals (ONE-A-DAY WOMENS 50+ ADVANTAGE PO) Take by mouth daily.  (Patient not taking: Reported on 12/19/2019)      Results for orders placed or performed during the hospital encounter of 01/03/20 (from the past 48 hour(s))  Glucose, capillary     Status: Abnormal   Collection Time: 01/03/20  6:26 AM  Result Value Ref Range   Glucose-Capillary 325 (H) 70 - 99 mg/dL    Comment: Glucose reference range applies only to samples taken after fasting for at least 8 hours.  Glucose, capillary     Status: Abnormal   Collection Time: 01/03/20  7:02 AM  Result Value Ref Range   Glucose-Capillary 290 (H) 70 - 99 mg/dL    Comment: Glucose reference range applies only to samples taken after fasting for at least 8 hours.   No results found.  Review of Systems  Constitutional: Negative.   HENT: Negative.   Eyes: Negative.   Respiratory: Negative.   Cardiovascular: Negative.   Gastrointestinal: Positive for abdominal distention and abdominal pain.  Endocrine: Negative.   Genitourinary: Negative.   Musculoskeletal: Negative.   Allergic/Immunologic: Negative.   Neurological: Negative.   Hematological: Negative.   Psychiatric/Behavioral: Negative.     Blood pressure 124/70, pulse 69, temperature 98.6 F (37 C), resp. rate 19, SpO2 97 %. Physical Exam  GENERAL: The patient is AO x3, in no acute distress. Obese. HEENT: Head is normocephalic and atraumatic. EOMI are intact. Mouth is well hydrated and without lesions. NECK: Supple. No masses LUNGS: Clear to auscultation. No presence of rhonchi/wheezing/rales. Adequate chest expansion HEART: RRR, normal s1 and s2. ABDOMEN: Soft, nontender, no guarding, no peritoneal signs, and nondistended. BS +. No masses. EXTREMITIES: Without any cyanosis, clubbing, rash, lesions or edema. NEUROLOGIC: AOx3, no focal motor deficit. SKIN: no jaundice, no rashes  Assessment/Plan Samantha Turner is a 59 y.o. 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 fib status post RFA, coming to the hospital for evaluation of upper abdominal pain.  Pain has been chronic.  We will proceed with EGD with gastric biopsies.  Dolores Frame, MD 01/03/2020, 7:41 AM

## 2020-01-03 NOTE — Discharge Instructions (Signed)
You are being discharged to home.  Resume your previous diet.  We are waiting for your pathology results.  Schedule EGD with EUS for evaluation of duodenal submucosal lesions.   ****Left message at Dr. Johnsie Kindred office Wendie Simmer coordinator to schedule***** Start Levsin SL as needed for episodes of abdominal pain.       Upper Endoscopy, Adult, Care After This sheet gives you information about how to care for yourself after your procedure. Your health care provider may also give you more specific instructions. If you have problems or questions, contact your health care provider. What can I expect after the procedure? After the procedure, it is common to have:  A sore throat.  Mild stomach pain or discomfort.  Bloating.  Nausea. Follow these instructions at home:   Follow instructions from your health care provider about what to eat or drink after your procedure.  Return to your normal activities as told by your health care provider. Ask your health care provider what activities are safe for you.  Take over-the-counter and prescription medicines only as told by your health care provider.  Do not drive for 24 hours if you were given a sedative during your procedure.  Keep all follow-up visits as told by your health care provider. This is important. Contact a health care provider if you have:  A sore throat that lasts longer than one day.  Trouble swallowing. Get help right away if:  You vomit blood or your vomit looks like coffee grounds.  You have: ? A fever. ? Bloody, black, or tarry stools. ? A severe sore throat or you cannot swallow. ? Difficulty breathing. ? Severe pain in your chest or abdomen. Summary  After the procedure, it is common to have a sore throat, mild stomach discomfort, bloating, and nausea.  Do not drive for 24 hours if you were given a sedative during the procedure.  Follow instructions from your health care provider about what to eat or  drink after your procedure.  Return to your normal activities as told by your health care provider. This information is not intended to replace advice given to you by your health care provider. Make sure you discuss any questions you have with your health care provider. Document Revised: 11/14/2017 Document Reviewed: 10/23/2017 Elsevier Patient Education  2020 Elsevier Inc.     Monitored Anesthesia Care, Care After These instructions provide you with information about caring for yourself after your procedure. Your health care provider may also give you more specific instructions. Your treatment has been planned according to current medical practices, but problems sometimes occur. Call your health care provider if you have any problems or questions after your procedure. What can I expect after the procedure? After your procedure, you may:  Feel sleepy for several hours.  Feel clumsy and have poor balance for several hours.  Feel forgetful about what happened after the procedure.  Have poor judgment for several hours.  Feel nauseous or vomit.  Have a sore throat if you had a breathing tube during the procedure. Follow these instructions at home: For at least 24 hours after the procedure:      Have a responsible adult stay with you. It is important to have someone help care for you until you are awake and alert.  Rest as needed.  Do not: ? Participate in activities in which you could fall or become injured. ? Drive. ? Use heavy machinery. ? Drink alcohol. ? Take sleeping pills or medicines that cause  drowsiness. ? Make important decisions or sign legal documents. ? Take care of children on your own. Eating and drinking  Follow the diet that is recommended by your health care provider.  If you vomit, drink water, juice, or soup when you can drink without vomiting.  Make sure you have little or no nausea before eating solid foods. General instructions  Take  over-the-counter and prescription medicines only as told by your health care provider.  If you have sleep apnea, surgery and certain medicines can increase your risk for breathing problems. Follow instructions from your health care provider about wearing your sleep device: ? Anytime you are sleeping, including during daytime naps. ? While taking prescription pain medicines, sleeping medicines, or medicines that make you drowsy.  If you smoke, do not smoke without supervision.  Keep all follow-up visits as told by your health care provider. This is important. Contact a health care provider if:  You keep feeling nauseous or you keep vomiting.  You feel light-headed.  You develop a rash.  You have a fever. Get help right away if:  You have trouble breathing. Summary  For several hours after your procedure, you may feel sleepy and have poor judgment.  Have a responsible adult stay with you for at least 24 hours or until you are awake and alert. This information is not intended to replace advice given to you by your health care provider. Make sure you discuss any questions you have with your health care provider. Document Revised: 08/21/2017 Document Reviewed: 09/13/2015 Elsevier Patient Education  2020 [employer].

## 2020-01-03 NOTE — Op Note (Addendum)
[facility] Patient Name: Samantha Turner Procedure Date: 01/03/2020 7:21 AM MRN: [id number] Date of Birth: [birth year] Attending MD: [name] ,  CSN: [id number] Age: 59 Admit Type: Outpatient Procedure:                Upper GI endoscopy Indications:              Epigastric abdominal pain Providers:                [name], Criselda Peaches. Patsy Lager, RN, Crystal                            Page, Edythe Clarity, Technician Referring MD:              Medicines:                Monitored Anesthesia Care Complications:            No immediate complications. Estimated Blood Loss:     Estimated blood loss: none. Procedure:                Pre-Anesthesia Assessment:                           - Prior to the procedure, a History and Physical                            was performed, and patient medications, allergies                            and sensitivities were reviewed. The patient's                            tolerance of previous anesthesia was reviewed.                           - The risks and benefits of the procedure and the                            sedation options and risks were discussed with the                            patient. All questions were answered and informed                            consent was obtained.                           - ASA Grade Assessment: II - A patient with mild                            systemic disease.                           After obtaining informed consent, the endoscope was                            passed under direct vision. Throughout the  procedure, the patient's blood pressure, pulse, and                            oxygen saturations were monitored continuously. The                            [id number]-H190 ([id number]) scope was introduced through the                            mouth, and advanced to the second part of duodenum.                            The upper GI endoscopy was accomplished without                             difficulty. The patient tolerated the procedure                            well. Scope In: 7:48:20 AM Scope Out: 7:57:41 AM Total Procedure Duration: 0 hours 9 minutes 21 seconds  Findings:      The Z-line was regular and was found 40 cm from the incisors.      The examined esophagus was normal.      A single localized small erosion with no bleeding and no stigmata of       recent bleeding was found in the gastric body. Biopsies were taken with       a cold forceps for Helicobacter pylori testing.      One 2 mm mucosal papule (nodule) with no bleeding was found in the       gastric body. Biopsies were taken with a cold forceps for histology.      Two 4 to 5 mm submucosal polyps were found in the second portion of the       duodenum. The lesion appeared to be similar to lipomas. Impression:               - Z-line regular, 40 cm from the incisors.                           - Normal esophagus.                           - Erosive gastropathy with no bleeding and no                            stigmata of recent bleeding. Biopsied.                           - One mucosal nodule found in the stomach. Biopsied.                           - Two duodenal well circumscribed submucosal                            lesions. Moderate Sedation:      Per Anesthesia Care Recommendation:           -  Discharge patient to home (ambulatory).                           - Resume previous diet.                           - Await pathology results.                           - Schedule EGD with EUS for evaluation of duodenal                            submucosal lesions.                           - Start Levsin SL as needed for episodes of                            abdominal pain. Procedure Code(s):        --- Professional ---                           [phone], GC, Esophagogastroduodenoscopy, flexible,                            transoral; with biopsy, single or multiple Diagnosis Code(s):         --- Professional ---                           K31.89, Other diseases of stomach and duodenum                           K31.7, Polyp of stomach and duodenum                           R10.13, Epigastric pain CPT copyright 2019 American Medical Association. All rights reserved. The codes documented in this report are preliminary and upon coder review may  be revised to meet current compliance requirements. [name], MD [name],  01/03/2020 8:06:55 AM This report has been signed electronically. Number of Addenda: 0

## 2020-01-06 ENCOUNTER — Telehealth: Payer: Self-pay

## 2020-01-06 ENCOUNTER — Other Ambulatory Visit: Payer: Self-pay

## 2020-01-06 LAB — SURGICAL PATHOLOGY

## 2020-01-06 NOTE — Telephone Encounter (Signed)
-----   Message from Lemar Lofty., MD sent at 01/06/2020  1:08 PM EDT ----- Regarding: RE: EUS Simora Dingee, Non-urgent EGD/EUS with DJ or myself to rule out other submucosal lesions - though likely lipomas we can rule out GIST or Leiomyomas. Thanks. GM ----- Message ----- From: Loretha Stapler, RN Sent: 01/06/2020  12:22 PM EDT To: Rachael Fee, MD, # Subject: FW: EUS                                         ----- Message ----- From: Simone Curia Sent: 01/06/2020  12:19 PM EDT To: Loretha Stapler, RN Subject: EUS                                            Hey Shylo Zamor - we've added a new doctoir to our practice; Dr [name]  He would like for patient to have EGD/EUS for duodenal submucosal lesions - all records in Epic - let me know if you have questions, Thanks, Dewayne Hatch

## 2020-01-07 ENCOUNTER — Other Ambulatory Visit: Payer: Self-pay

## 2020-01-07 ENCOUNTER — Encounter (HOSPITAL_COMMUNITY): Payer: Self-pay | Admitting: Gastroenterology

## 2020-01-07 DIAGNOSIS — K3189 Other diseases of stomach and duodenum: Secondary | ICD-10-CM

## 2020-01-07 NOTE — Telephone Encounter (Signed)
EUS scheduled, pt instructed and medications reviewed.  Patient instructions sent to My Chart-pt confirmed she can review.   Patient to call with any questions or concerns.

## 2020-01-21 DIAGNOSIS — Z1331 Encounter for screening for depression: Secondary | ICD-10-CM | POA: Diagnosis not present

## 2020-01-21 DIAGNOSIS — E039 Hypothyroidism, unspecified: Secondary | ICD-10-CM | POA: Diagnosis not present

## 2020-01-21 DIAGNOSIS — I10 Essential (primary) hypertension: Secondary | ICD-10-CM | POA: Diagnosis not present

## 2020-01-21 DIAGNOSIS — Z6839 Body mass index (BMI) 39.0-39.9, adult: Secondary | ICD-10-CM | POA: Diagnosis not present

## 2020-01-21 DIAGNOSIS — Z79899 Other long term (current) drug therapy: Secondary | ICD-10-CM | POA: Diagnosis not present

## 2020-01-21 DIAGNOSIS — Z0000 Encounter for general adult medical examination without abnormal findings: Secondary | ICD-10-CM | POA: Diagnosis not present

## 2020-01-21 DIAGNOSIS — E7800 Pure hypercholesterolemia, unspecified: Secondary | ICD-10-CM | POA: Diagnosis not present

## 2020-01-21 DIAGNOSIS — Z299 Encounter for prophylactic measures, unspecified: Secondary | ICD-10-CM | POA: Diagnosis not present

## 2020-01-21 DIAGNOSIS — E1165 Type 2 diabetes mellitus with hyperglycemia: Secondary | ICD-10-CM | POA: Diagnosis not present

## 2020-01-23 ENCOUNTER — Other Ambulatory Visit: Payer: Self-pay

## 2020-01-23 ENCOUNTER — Ambulatory Visit (INDEPENDENT_AMBULATORY_CARE_PROVIDER_SITE_OTHER): Payer: BC Managed Care – PPO | Admitting: Gastroenterology

## 2020-01-23 ENCOUNTER — Encounter (INDEPENDENT_AMBULATORY_CARE_PROVIDER_SITE_OTHER): Payer: Self-pay | Admitting: Gastroenterology

## 2020-01-23 DIAGNOSIS — K30 Functional dyspepsia: Secondary | ICD-10-CM | POA: Insufficient documentation

## 2020-01-23 NOTE — Patient Instructions (Addendum)
Continue with Levsin as needed for abdominal cramping. Explained presumed etiology of functional dyspepsia symptoms. Patient was counseled about the benefit of implementing a low FODMAP to improve symptoms and recurrent episodes. A dietary list was provided to the patient. Also, the patient was counseled about the benefit of avoiding stressing situations and potential environmental triggers leading to symptomatology. Start FDGard 1 tablet every 8-12 hours as needed

## 2020-01-23 NOTE — H&P (View-Only) (Signed)
[name], M.D. Gastroenterology & Hepatology [facility] [address] [place], [place] [address] Primary Care Physician: Kirstie Peri, MD [address] [place] [address]  I will communicate my assessment and recommendations to the referring MD via EMR. Note: Occasional unusual wording and randomly placed punctuation marks may result from the use of speech recognition technology to transcribe this document"  Problems: 1. Functional dyspepsia 2.  Submucosal lesion in duodenum 3. Fatty liver  History of Present Illness: Samantha Turner is a 59 y.o. 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 fib status post RFA, who comes to the clinic for follow-up of abdominal pain.  The patient was last seen 12/11/2019. Marland Kitchen  At that time, the patient underwent work-up for borderline splenomegaly which was negative for any significant fibrosis in her elastography.  The patient had presence of hepatic steatosis only.  She also underwent an EGD on 01/03/2020 which showed localized erosions without any active bleeding or alterations and a small nodule in the gastric body.  These were biopsied and came back negative for H. pylori or any significant ulceration besides chronic inflammation.  She also was found to have two 4 to 5 mm mucosal polyp in the second portion of the duodenum for which she was referred to EUS at [facility].  The patient was given symptomatic management for functional dyspepsia with Levsin.  The patient currently reports that her symptoms have much more improved as she has very seldom abdominal pain.  States that her cramping happens possibly twice a month but it gets better when she takes the Levsin.  The intensity has also decreased substantially to 5/10.  She does  not have any other complaint at this moment.  The patient denies having any nausea, vomiting, fever, chills, hematochezia, melena, hematemesis, abdominal distention,  diarrhea, jaundice, pruritus or weight loss.  Notably, the patient is scheduled to undergo EUS on 02/20/2020 by Dr. Christella Hartigan for evaluation of submucosal nodule found in recent EGD.  Past Medical History: Past Medical History:  Diagnosis Date  . Anxiety   . Bronchogenic cyst   . Depression   . Essential hypertension   . Headache   . History of cardiac catheterization    Normal coronary arteries July 2017  . Nonischemic cardiomyopathy (HCC)   . Obstructive sleep apnea   . Paroxysmal atrial flutter (HCC)    RFA 07/2016 - Dr. Johney Frame    Past Surgical History: Past Surgical History:  Procedure Laterality Date  . A-FLUTTER ABLATION N/A 07/26/2016   Procedure: A-Flutter Ablation;  Surgeon: Hillis Range, MD;  [facility] INVASIVE CV LAB;  Service: Cardiovascular;  Laterality: N/A;  . ABDOMINAL HYSTERECTOMY    . BIOPSY  01/03/2020   Procedure: BIOPSY;  Surgeon: Dolores Frame, MD;  Location: [facility] ENDO SUITE;  Service: Gastroenterology;;  gastric  . CARDIAC CATHETERIZATION N/A 12/16/2015   Procedure: Left Heart Cath and Coronary Angiography;  Surgeon: Iran Ouch, MD;  Location: [facility] INVASIVE CV LAB;  Service: Cardiovascular;  Laterality: N/A;  . CARDIOVERSION N/A 12/18/2015   Procedure: CARDIOVERSION;  Surgeon: Lars Masson, MD;  Location: [facility] ENDOSCOPY;  Service: Cardiovascular;  Laterality: N/A;  . CHOLECYSTECTOMY    . COLONOSCOPY     About 3 years ago at [facility]  . ESOPHAGOGASTRODUODENOSCOPY (EGD) WITH PROPOFOL N/A 01/03/2020   Procedure: ESOPHAGOGASTRODUODENOSCOPY (EGD) WITH PROPOFOL;  Surgeon: Marguerita Merles, Reuel Boom, MD;  Location: [facility] ENDO SUITE;  Service: Gastroenterology;  Laterality: N/A;  730  . TEE WITHOUT CARDIOVERSION N/A 12/18/2015   Procedure: TRANSESOPHAGEAL ECHOCARDIOGRAM (TEE);  Surgeon:  Lars Masson, MD;  Location: [facility] ENDOSCOPY;  Service: Cardiovascular;  Laterality: N/A;  . TUMOR REMOVAL  07/02/2015   ovarian    Family History: Family History  Problem Relation Age of Onset  . Bladder Cancer Father   . Heart failure Mother   . COPD Mother   . Congestive Heart Failure Mother   . Thyroid disease Sister   . Suicidality Brother   . Heart disease Sister   . Healthy Brother   . Heart disease Brother   . Thyroid disease Daughter   . Healthy Daughter     Social History: Social History   Tobacco Use  Smoking Status Never Smoker  Smokeless Tobacco Never Used   Social History   Substance and Sexual Activity  Alcohol Use Yes   Comment: rarel   Social History   Substance and Sexual Activity  Drug Use No    Allergies: No Known Allergies  Medications: Current Outpatient Medications  Medication Sig Dispense Refill  . [START ON 01/31/2020] ARIPiprazole (ABILIFY) 5 MG tablet Take 1 tablet (5 mg total) by mouth daily. 90 tablet 0  . atorvastatin (LIPITOR) 40 MG tablet TAKE 1 TABLET BY MOUTH DAILY AT 6PM (Patient taking differently: Take 40 mg by mouth daily. ) 90 tablet 2  . diazepam (VALIUM) 2 MG tablet Take 0.5 tablets (1 mg total) by mouth daily as needed for anxiety. 15 tablet 3  . furosemide (LASIX) 20 MG tablet Take 10 mg by mouth daily.    Marland Kitchen gabapentin (NEURONTIN) 100 MG capsule Take 100 mg by mouth at bedtime.    . hyoscyamine (LEVSIN SL) 0.125 MG SL tablet Place 1 tablet (0.125 mg total) under the tongue every 6 (six) hours as needed for cramping. 30 tablet 0  . metFORMIN (GLUCOPHAGE) 500 MG tablet Take 500 mg by mouth 2 (two) times daily.    . Multiple Vitamins-Minerals (ONE-A-DAY WOMENS 50+ ADVANTAGE PO) Take by mouth daily.     Marland Kitchen NOVOLOG FLEXPEN 100 UNIT/ML FlexPen Inject 1 Units into the skin as directed.    Melene Muller ON 01/31/2020] venlafaxine XR (EFFEXOR-XR) 150 MG 24 hr capsule Take 1 capsule (150 mg total) by mouth daily with breakfast. 90  capsule 0  . VENTOLIN HFA 108 (90 Base) MCG/ACT inhaler Inhale 1-2 puffs into the lungs every 6 (six) hours as needed for shortness of breath or wheezing.  0   No current facility-administered medications for this visit.    Review of Systems: GENERAL: negative for malaise, night sweats HEENT: No changes in hearing or vision, no nose bleeds or other nasal problems. NECK: Negative for lumps, goiter, pain and significant neck swelling RESPIRATORY: Negative for cough, wheezing CARDIOVASCULAR: Negative for chest pain, leg swelling, palpitations, orthopnea GI: SEE HPI MUSCULOSKELETAL: Negative for joint pain or swelling, back pain, and muscle pain. SKIN: Negative for lesions, rash PSYCH: Negative for sleep disturbance, mood disorder and recent psychosocial stressors. HEMATOLOGY Negative for prolonged bleeding, bruising easily, and swollen nodes. ENDOCRINE: Negative for cold or heat intolerance, polyuria, polydipsia and goiter. NEURO: negative for tremor, gait imbalance, syncope and seizures. The remainder of the review of systems is noncontributory.   Physical Exam: BP 122/80 (BP Location: Right Arm, Patient Position: Sitting, Cuff Size: Large)   Pulse 74   Temp 98.6 F (37 C) (  Oral)   Ht 5\' 2"  (1.575 m)   Wt 234 lb (106.1 kg)   BMI 42.80 kg/m  GENERAL: The patient is AO x3, in no acute distress.  Obese. HEENT: Head is normocephalic and atraumatic. EOMI are intact. Mouth is well hydrated and without lesions. NECK: Supple. No masses LUNGS: Clear to auscultation. No presence of rhonchi/wheezing/rales. Adequate chest expansion HEART: RRR, normal s1 and s2. ABDOMEN: Mildly tender upon palpation of the right upper quadrant, no guarding, no peritoneal signs, and nondistended. BS +. No masses. EXTREMITIES: Without any cyanosis, clubbing, rash, lesions or edema. NEUROLOGIC: AOx3, no focal motor deficit. SKIN: no jaundice, no rashes   Imaging/Labs: as above  I personally reviewed and  interpreted the available labs, imaging and endoscopic files.  Impression and Plan: Samantha Turner is a 59 y.o. 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 fib status post RFA, coming to clinic for follow-up of abdominal pain in her right upper quadrant.  The patient had previous investigations with CT of the abdomen and liver ultrasound which did not show any alteration explaining for her symptoms, as well as an EGD recently that only showed mild erosions in the gastric chamber without other major abnormalities.  At this point, her symptoms are more consistent with functional dyspepsia, which may require the use of dietary changes such as a low FODMAP diet, as well as continuing antispasmodic medications like Levsin as it has led to improvement of her symptoms.  If she has breakthrough symptoms, she can use FDGard as needed.  The patient understood and agreed.  - Continue with Levsin PRN for abdominal cramping. - Explained presumed etiology of functional dyspepsia symptoms. Patient was counseled about the benefit of implementing a low FODMAP to improve symptoms and recurrent episodes. A dietary list was provided to the patient. Also, the patient was counseled about the benefit of avoiding stressing situations and potential environmental triggers leading to symptomatology. - Start FDGard 1 tablet every 8-12 hours for breakthrough symptoms  All questions were answered.      65, MD Gastroenterology and Hepatology [facility]

## 2020-01-23 NOTE — Progress Notes (Signed)
[name], M.D. Gastroenterology & Hepatology [facility] [address] [place], [place] [address] Primary Care Physician: Kirstie Peri, MD [address] [place] [address]  I will communicate my assessment and recommendations to the referring MD via EMR. Note: Occasional unusual wording and randomly placed punctuation marks may result from the use of speech recognition technology to transcribe this document"  Problems: 1. Functional dyspepsia 2.  Submucosal lesion in duodenum 3. Fatty liver  History of Present Illness: Samantha Turner is a 59 y.o. 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 fib status post RFA, who comes to the clinic for follow-up of abdominal pain.  The patient was last seen 12/11/2019. Marland Kitchen  At that time, the patient underwent work-up for borderline splenomegaly which was negative for any significant fibrosis in her elastography.  The patient had presence of hepatic steatosis only.  She also underwent an EGD on 01/03/2020 which showed localized erosions without any active bleeding or alterations and a small nodule in the gastric body.  These were biopsied and came back negative for H. pylori or any significant ulceration besides chronic inflammation.  She also was found to have two 4 to 5 mm mucosal polyp in the second portion of the duodenum for which she was referred to EUS at [facility].  The patient was given symptomatic management for functional dyspepsia with Levsin.  The patient currently reports that her symptoms have much more improved as she has very seldom abdominal pain.  States that her cramping happens possibly twice a month but it gets better when she takes the Levsin.  The intensity has also decreased substantially to 5/10.  She does  not have any other complaint at this moment.  The patient denies having any nausea, vomiting, fever, chills, hematochezia, melena, hematemesis, abdominal distention,  diarrhea, jaundice, pruritus or weight loss.  Notably, the patient is scheduled to undergo EUS on 02/20/2020 by Dr. Christella Hartigan for evaluation of submucosal nodule found in recent EGD.  Past Medical History: Past Medical History:  Diagnosis Date  . Anxiety   . Bronchogenic cyst   . Depression   . Essential hypertension   . Headache   . History of cardiac catheterization    Normal coronary arteries July 2017  . Nonischemic cardiomyopathy (HCC)   . Obstructive sleep apnea   . Paroxysmal atrial flutter (HCC)    RFA 07/2016 - Dr. Johney Frame    Past Surgical History: Past Surgical History:  Procedure Laterality Date  . A-FLUTTER ABLATION N/A 07/26/2016   Procedure: A-Flutter Ablation;  Surgeon: Hillis Range, MD;  Location: [facility] INVAS[facility] CV LAB;  Service: Cardiovascular;  Laterality: N/A;  . ABDOMINAL HYSTERECTOMY    . BIOPSY  01/03/2020   Procedure: BIOPSY;  Surgeon: Dolores Frame, MD;  [facility];  Service: Gastroenterology;;  gastric  . CARDIAC CATHETERIZATION N/A 12/16/2015   Procedure: Left Heart Cath and Coronary Angiography;  Surgeon: Iran Ouch, MD;  Location: [facility] INVASIVE CV LAB;  Service: Cardiovascular;  Laterality: N/A;  . CARDIOVERSION N/A 12/18/2015   Procedure: CARDIOVERSION;  Surgeon: Lars Masson, MD;  Location: [facility] ENDOSCOPY;  Service: Cardiovascular;  Laterality: N/A;  . CHOLECYSTECTOMY    . COLONOSCOPY     About 3 years ago at [facility]  . ESOPHAGOGASTRODUODENOSCOPY (EGD) WITH PROPOFOL N/A 01/03/2020   Procedure: ESOPHAGOGASTRODUODENOSCOPY (EGD) WITH PROPOFOL;  Surgeon: Marguerita Merles, Reuel Boom, MD;  Location: [facility] ENDO SUITE;  Service: Gastroenterology;  Laterality: N/A;  730  . TEE WITHOUT CARDIOVERSION N/A 12/18/2015   Procedure: TRANSESOPHAGEAL ECHOCARDIOGRAM (TEE);  Surgeon:  Lars Masson, MD;  Location: [facility] ENDOSCOPY;  Service: Cardiovascular;  Laterality: N/A;  . TUMOR REMOVAL  07/02/2015   ovarian    Family History: Family History  Problem Relation Age of Onset  . Bladder Cancer Father   . Heart failure Mother   . COPD Mother   . Congestive Heart Failure Mother   . Thyroid disease Sister   . Suicidality Brother   . Heart disease Sister   . Healthy Brother   . Heart disease Brother   . Thyroid disease Daughter   . Healthy Daughter     Social History: Social History   Tobacco Use  Smoking Status Never Smoker  Smokeless Tobacco Never Used   Social History   Substance and Sexual Activity  Alcohol Use Yes   Comment: rarel   Social History   Substance and Sexual Activity  Drug Use No    Allergies: No Known Allergies  Medications: Current Outpatient Medications  Medication Sig Dispense Refill  . [START ON 01/31/2020] ARIPiprazole (ABILIFY) 5 MG tablet Take 1 tablet (5 mg total) by mouth daily. 90 tablet 0  . atorvastatin (LIPITOR) 40 MG tablet TAKE 1 TABLET BY MOUTH DAILY AT 6PM (Patient taking differently: Take 40 mg by mouth daily. ) 90 tablet 2  . diazepam (VALIUM) 2 MG tablet Take 0.5 tablets (1 mg total) by mouth daily as needed for anxiety. 15 tablet 3  . furosemide (LASIX) 20 MG tablet Take 10 mg by mouth daily.    Marland Kitchen gabapentin (NEURONTIN) 100 MG capsule Take 100 mg by mouth at bedtime.    . hyoscyamine (LEVSIN SL) 0.125 MG SL tablet Place 1 tablet (0.125 mg total) under the tongue every 6 (six) hours as needed for cramping. 30 tablet 0  . metFORMIN (GLUCOPHAGE) 500 MG tablet Take 500 mg by mouth 2 (two) times daily.    . Multiple Vitamins-Minerals (ONE-A-DAY WOMENS 50+ ADVANTAGE PO) Take by mouth daily.     Marland Kitchen NOVOLOG FLEXPEN 100 UNIT/ML FlexPen Inject 1 Units into the skin as directed.    Melene Muller ON 01/31/2020] venlafaxine XR (EFFEXOR-XR) 150 MG 24 hr capsule Take 1 capsule (150 mg total) by mouth daily with breakfast. 90  capsule 0  . VENTOLIN HFA 108 (90 Base) MCG/ACT inhaler Inhale 1-2 puffs into the lungs every 6 (six) hours as needed for shortness of breath or wheezing.  0   No current facility-administered medications for this visit.    Review of Systems: GENERAL: negative for malaise, night sweats HEENT: No changes in hearing or vision, no nose bleeds or other nasal problems. NECK: Negative for lumps, goiter, pain and significant neck swelling RESPIRATORY: Negative for cough, wheezing CARDIOVASCULAR: Negative for chest pain, leg swelling, palpitations, orthopnea GI: SEE HPI MUSCULOSKELETAL: Negative for joint pain or swelling, back pain, and muscle pain. SKIN: Negative for lesions, rash PSYCH: Negative for sleep disturbance, mood disorder and recent psychosocial stressors. HEMATOLOGY Negative for prolonged bleeding, bruising easily, and swollen nodes. ENDOCRINE: Negative for cold or heat intolerance, polyuria, polydipsia and goiter. NEURO: negative for tremor, gait imbalance, syncope and seizures. The remainder of the review of systems is noncontributory.   Physical Exam: BP 122/80 (BP Location: Right Arm, Patient Position: Sitting, Cuff Size: Large)   Pulse 74   Temp 98.6 F (37 C) (  Oral)   Ht 5' 2" (1.575 m)   Wt 234 lb (106.1 kg)   BMI 42.80 kg/m  GENERAL: The patient is AO x3, in no acute distress.  Obese. HEENT: Head is normocephalic and atraumatic. EOMI are intact. Mouth is well hydrated and without lesions. NECK: Supple. No masses LUNGS: Clear to auscultation. No presence of rhonchi/wheezing/rales. Adequate chest expansion HEART: RRR, normal s1 and s2. ABDOMEN: Mildly tender upon palpation of the right upper quadrant, no guarding, no peritoneal signs, and nondistended. BS +. No masses. EXTREMITIES: Without any cyanosis, clubbing, rash, lesions or edema. NEUROLOGIC: AOx3, no focal motor deficit. SKIN: no jaundice, no rashes   Imaging/Labs: as above  I personally reviewed and  interpreted the available labs, imaging and endoscopic files.  Impression and Plan: Samantha Turner is a 58 y.o. 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 fib status post RFA, coming to clinic for follow-up of abdominal pain in her right upper quadrant.  The patient had previous investigations with CT of the abdomen and liver ultrasound which did not show any alteration explaining for her symptoms, as well as an EGD recently that only showed mild erosions in the gastric chamber without other major abnormalities.  At this point, her symptoms are more consistent with functional dyspepsia, which may require the use of dietary changes such as a low FODMAP diet, as well as continuing antispasmodic medications like Levsin as it has led to improvement of her symptoms.  If she has breakthrough symptoms, she can use FDGard as needed.  The patient understood and agreed.  - Continue with Levsin PRN for abdominal cramping. - Explained presumed etiology of functional dyspepsia symptoms. Patient was counseled about the benefit of implementing a low FODMAP to improve symptoms and recurrent episodes. A dietary list was provided to the patient. Also, the patient was counseled about the benefit of avoiding stressing situations and potential environmental triggers leading to symptomatology. - Start FDGard 1 tablet every 8-12 hours for breakthrough symptoms  All questions were answered.      Samantha Skidmore Castaneda, MD Gastroenterology and Hepatology [facility]  

## 2020-01-27 DIAGNOSIS — I10 Essential (primary) hypertension: Secondary | ICD-10-CM | POA: Diagnosis not present

## 2020-01-27 DIAGNOSIS — Z299 Encounter for prophylactic measures, unspecified: Secondary | ICD-10-CM | POA: Diagnosis not present

## 2020-01-27 DIAGNOSIS — I4891 Unspecified atrial fibrillation: Secondary | ICD-10-CM | POA: Diagnosis not present

## 2020-01-27 DIAGNOSIS — E1165 Type 2 diabetes mellitus with hyperglycemia: Secondary | ICD-10-CM | POA: Diagnosis not present

## 2020-01-27 DIAGNOSIS — F319 Bipolar disorder, unspecified: Secondary | ICD-10-CM | POA: Diagnosis not present

## 2020-01-27 DIAGNOSIS — E039 Hypothyroidism, unspecified: Secondary | ICD-10-CM | POA: Diagnosis not present

## 2020-01-28 DIAGNOSIS — E1159 Type 2 diabetes mellitus with other circulatory complications: Secondary | ICD-10-CM | POA: Diagnosis not present

## 2020-01-28 DIAGNOSIS — E1143 Type 2 diabetes mellitus with diabetic autonomic (poly)neuropathy: Secondary | ICD-10-CM | POA: Diagnosis not present

## 2020-01-31 DIAGNOSIS — E2839 Other primary ovarian failure: Secondary | ICD-10-CM | POA: Diagnosis not present

## 2020-01-31 DIAGNOSIS — M818 Other osteoporosis without current pathological fracture: Secondary | ICD-10-CM | POA: Diagnosis not present

## 2020-02-12 DIAGNOSIS — E1129 Type 2 diabetes mellitus with other diabetic kidney complication: Secondary | ICD-10-CM | POA: Diagnosis not present

## 2020-02-12 DIAGNOSIS — I10 Essential (primary) hypertension: Secondary | ICD-10-CM | POA: Diagnosis not present

## 2020-02-12 DIAGNOSIS — E1165 Type 2 diabetes mellitus with hyperglycemia: Secondary | ICD-10-CM | POA: Diagnosis not present

## 2020-02-12 DIAGNOSIS — Z299 Encounter for prophylactic measures, unspecified: Secondary | ICD-10-CM | POA: Diagnosis not present

## 2020-02-12 DIAGNOSIS — Z6841 Body Mass Index (BMI) 40.0 and over, adult: Secondary | ICD-10-CM | POA: Diagnosis not present

## 2020-02-12 DIAGNOSIS — I428 Other cardiomyopathies: Secondary | ICD-10-CM | POA: Diagnosis not present

## 2020-02-17 ENCOUNTER — Other Ambulatory Visit (HOSPITAL_COMMUNITY)
Admission: RE | Admit: 2020-02-17 | Discharge: 2020-02-17 | Disposition: A | Discharge status: Home / Self Care | Payer: BC Managed Care – PPO | Source: Ambulatory Visit | Attending: Gastroenterology | Admitting: Gastroenterology

## 2020-02-17 DIAGNOSIS — Z20822 Contact with and (suspected) exposure to covid-19: Secondary | ICD-10-CM | POA: Diagnosis not present

## 2020-02-17 DIAGNOSIS — Z01812 Encounter for preprocedural laboratory examination: Secondary | ICD-10-CM | POA: Insufficient documentation

## 2020-02-17 LAB — SARS CORONAVIRUS 2 (TAT 6-24 HRS): SARS Coronavirus 2: NEGATIVE

## 2020-02-18 NOTE — Progress Notes (Signed)
Virtual Visit via Telephone Note  I connected with Samantha Turner on 03/02/20 at  1:40 PM EDT by telephone and verified that I am speaking with the correct person using two identifiers.   I discussed the limitations, risks, security and privacy concerns of performing an evaluation and management service by telephone and the availability of in person appointments. I also discussed with the patient that there may be a patient responsible charge related to this service. The patient expressed understanding and agreed to proceed.     I discussed the assessment and treatment plan with the patient. The patient was provided an opportunity to ask questions and all were answered. The patient agreed with the plan and demonstrated an understanding of the instructions.   The patient was advised to call back or seek an in-person evaluation if the symptoms worsen or if the condition fails to improve as anticipated.  Location: patient- home, provider- office   I provided 8 minutes of non-face-to-face time during this encounter.   Neysa Hotter, MD    [facility] MD/PA/NP OP Progress Note  03/02/2020 1:56 PM Samantha Turner  MRN:  [id number]  Chief Complaint:  Chief Complaint    Depression; Follow-up     HPI:  - She was evaluated with labs, CT, [place] for abdominal pain. Ddx includes functional dyspepsia.   This is a follow-up appointment for depression.  She states that she has been doing very well.  She is more active lately; she has started to take a walk, and feels good about weight loss of 12 pounds. She went to [facility] with her 2 sisters, who she has great relationship with.  She also reports great relationship with her family at home.  She sleeps well.  She denies feeling depressed.  She has good energy and motivation.  She has good concentration.  She denies SI.  Although she feels occasionally anxious, it is self-limited.  She denies panic attacks.  She feels comfortable staying on her current  medication.   Daily routine: work on [employer], taking a walk, going outside with her family Household: Husband, two daughters, two grandchildren, age 90, 23  Visit Diagnosis:    ICD-10-CM   1. MDD (major depressive disorder), recurrent, in full remission (HCC)  F33.42     Past Psychiatric History: Please see initial evaluation for full details. I have reviewed the history. No updates at this time.     Past Medical History:  Past Medical History:  Diagnosis Date  . Anxiety   . Bronchogenic cyst   . Depression   . Essential hypertension   . Headache   . History of cardiac catheterization    Normal coronary arteries July 2017  . Nonischemic cardiomyopathy (HCC)   . Obstructive sleep apnea   . Paroxysmal atrial flutter (HCC)    RFA 07/2016 - Dr. Johney Frame    Past Surgical History:  Procedure Laterality Date  . A-FLUTTER ABLATION N/A 07/26/2016   Procedure: A-Flutter Ablation;  Surgeon: Hillis Range, MD;  Location: [facility] INVASIVE CV LAB;  Service: Cardiovascular;  Laterality: N/A;  . ABDOMINAL HYSTERECTOMY    . BIOPSY  01/03/2020   Procedure: BIOPSY;  Surgeon: Dolores Frame, MD;  [facility];  Service: Gastroenterology;;  gastric  . CARDIAC CATHETERIZATION N/A 12/16/2015   Procedure: Left Heart Cath and Coronary Angiography;  Surgeon: Iran Ouch, MD;  Location: [facility] INVASIVE CV LAB;  Service: Cardiovascular;  Laterality: N/A;  . CARDIOVERSION N/A 12/18/2015   Procedure: CARDIOVERSION;  Surgeon: Lars Masson, MD;  Location: [facility] ENDOSCOPY;  Service: Cardiovascular;  Laterality: N/A;  . CHOLECYSTECTOMY    . COLONOSCOPY     About 3 years ago at [facility]  . ESOPHAGOGASTRODUODENOSCOPY (EGD) WITH PROPOFOL N/A 01/03/2020   Procedure: ESOPHAGOGASTRODUODENOSCOPY (EGD) WITH PROPOFOL;  Surgeon: Dolores Frame, MD;  [facility]: [facility] ENDO [facility];  Service: Gastroenterology;  Laterality: N/A;  730  . ESOPHAGOGASTRODUODENOSCOPY (EGD) WITH PROPOFOL N/A  02/20/2020   Procedure: ESOPHAGOGASTRODUODENOSCOPY (EGD) WITH PROPOFOL;  Surgeon: Rachael Fee, MD;  Location: [facility] ENDOSCOPY;  Service: Endoscopy;  Laterality: N/A;  . EUS N/A 02/20/2020   Procedure: UPPER ENDOSCOPIC ULTRASOUND (EUS) RADIAL;  Surgeon: Rachael Fee, MD;  Location: [facility] ENDOSCOPY;  Service: Endoscopy;  Laterality: N/A;  . TEE WITHOUT CARDIOVERSION N/A 12/18/2015   Procedure: TRANSESOPHAGEAL ECHOCARDIOGRAM (TEE);  Surgeon: Lars Masson, MD;  Location: [facility] ENDOSCOPY;  Service: Cardiovascular;  Laterality: N/A;  . TUMOR REMOVAL  07/02/2015   ovarian    Family Psychiatric History: Please see initial evaluation for full details. I have reviewed the history. No updates at this time.     Family History:  Family History  Problem Relation Age of Onset  . Bladder Cancer Father   . Heart failure Mother   . COPD Mother   . Congestive Heart Failure Mother   . Thyroid disease Sister   . Suicidality Brother   . Heart disease Sister   . Healthy Brother   . Heart disease Brother   . Thyroid disease Daughter   . Healthy Daughter     Social History:  Social History   Socioeconomic History  . Marital status: Married    Spouse name: Not on file  . Number of children: Not on file  . Years of education: Not on file  . Highest education level: Not on file  Occupational History  . Not on file  Tobacco Use  . Smoking status: Never Smoker  . Smokeless tobacco: Never Used  Substance and Sexual Activity  . Alcohol use: Yes    Comment: rarel  . Drug use: No  . Sexual activity: Yes    Birth control/protection: Surgical  Other Topics Concern  . Not on file  Social History Narrative  . Not on file   Social Determinants of Health   Financial Resource Strain:   . Difficulty of Paying Living Expenses: Not on file  Food Insecurity:   . Worried About Programme researcher, broadcasting/film/video in the Last Year: Not on file  . Ran Out of Food in the Last Year: Not on file  Transportation Needs:    . Lack of Transportation (Medical): Not on file  . Lack of Transportation (Non-Medical): Not on file  Physical Activity:   . Days of Exercise per Week: Not on file  . Minutes of Exercise per Session: Not on file  Stress:   . Feeling of Stress : Not on file  Social Connections:   . Frequency of Communication with Friends and Family: Not on file  . Frequency of Social Gatherings with Friends and Family: Not on file  . Attends Religious Services: Not on file  . Active Member of Clubs or Organizations: Not on file  . Attends Banker Meetings: Not on file  . Marital Status: Not on file    Allergies: No Known Allergies  Metabolic Disorder Labs: Lab Results  Component Value Date   HGBA1C 5.7 (H) 12/15/2015   MPG 117 12/15/2015   No results found  for: PROLACTIN Lab Results  Component Value Date   CHOL 214 (H) 12/16/2015   TRIG 399 (H) 12/16/2015   HDL 25 (L) 12/16/2015   CHOLHDL 8.6 12/16/2015   VLDL 80 (H) 12/16/2015   LDLCALC 109 (H) 12/16/2015   Lab Results  Component Value Date   TSH 7.753 (H) 10/21/2019   TSH 1.170 12/15/2015    Therapeutic Level Labs: No results found for: LITHIUM No results found for: VALPROATE No components found for:  CBMZ  Current Medications: Current Outpatient Medications  Medication Sig Dispense Refill  . [START ON 04/29/2020] ARIPiprazole (ABILIFY) 5 MG tablet Take 1 tablet (5 mg total) by mouth daily. 90 tablet 0  . atorvastatin (LIPITOR) 40 MG tablet TAKE 1 TABLET BY MOUTH DAILY AT 6PM (Patient taking differently: Take 40 mg by mouth daily. ) 90 tablet 2  . diazepam (VALIUM) 2 MG tablet Take 0.5 tablets (1 mg total) by mouth daily as needed for anxiety. (Patient taking differently: Take 2 mg by mouth daily. ) 15 tablet 3  . furosemide (LASIX) 20 MG tablet Take 10 mg by mouth daily.    Marland Kitchen gabapentin (NEURONTIN) 100 MG capsule Take 100 mg by mouth at bedtime.    . hyoscyamine (LEVSIN SL) 0.125 MG SL tablet Place 1 tablet (0.125  mg total) under the tongue every 6 (six) hours as needed for cramping. 30 tablet 0  . metFORMIN (GLUCOPHAGE) 500 MG tablet Take 500 mg by mouth 2 (two) times daily.    . Multiple Vitamins-Minerals (ONE-A-DAY WOMENS 50+ ADVANTAGE PO) Take by mouth daily.     Marland Kitchen NOVOLOG FLEXPEN 100 UNIT/ML FlexPen Inject 2 Units into the skin daily. Sliding scale    . [START ON 04/29/2020] venlafaxine XR (EFFEXOR-XR) 150 MG 24 hr capsule Take 1 capsule (150 mg total) by mouth daily with breakfast. 90 capsule 0  . VENTOLIN HFA 108 (90 Base) MCG/ACT inhaler Inhale 1-2 puffs into the lungs every 6 (six) hours as needed for shortness of breath or wheezing.  0   No current facility-administered medications for this visit.     Musculoskeletal: Strength & Muscle Tone: N/A Gait & Station: N/A Patient leans: N/A  Psychiatric Specialty Exam: Review of Systems  Psychiatric/Behavioral: Negative for agitation, behavioral problems, confusion, decreased concentration, dysphoric mood, hallucinations, self-injury, sleep disturbance and suicidal ideas. The patient is nervous/anxious. The patient is not hyperactive.   All other systems reviewed and are negative.   There were no vitals taken for this visit.There is no height or weight on file to calculate BMI.  General Appearance: NA  Eye Contact:  Good  Speech:  Clear and Coherent  Volume:  Normal  Mood:  good  Affect:  NA  Thought Process:  Coherent  Orientation:  Full (Time, Place, and Person)  Thought Content: Logical   Suicidal Thoughts:  No  Homicidal Thoughts:  No  Memory:  Immediate;   Good  Judgement:  Good  Insight:  Fair  Psychomotor Activity:  Normal  Concentration:  Concentration: Good and Attention Span: Good  Recall:  Good  Fund of Knowledge: Good  Language: Good  Akathisia:  No  Handed:  Right  AIMS (if indicated): not done  Assets:  Communication Skills Desire for Improvement  ADL's:  Intact  Cognition: WNL  Sleep:  Good    Screenings:   Assessment and Plan:  Samantha Turner is a 59 y.o. year old female with a history of depression,chronic systolic dysfunction with NYHA class II/III CHF, A flutter, OSA (  she declined cpap machine), who presents for follow up appointment for below.   1. MDD (major depressive disorder), recurrent, in full remission (HCC) She denies significant mood symptoms since the last visit.  Will continue current dose of venlafaxine as maintenance therapy for depression.  We will continue Abilify as adjunctive treatment for depression.  Discussed potential metabolic side effect and EPS. Will consider tapering down this medication in the future if she denies any significant mood symptoms at the next visit (she reinitiated Abilify on Sept 2019)  # Insomnia There has been improvement in insomnia. Although she was diagnosed with sleep apnea, she is not amenable to use CPAP machine. Will continue to discuss as needed.   Plan I have reviewed and updated plans as below 1. Continue Effexor 150 mg daily- could not tolerate higher dose due to insomnia 2.ContinueAbilify 5 mg daily 3.Continue Valium 1 mg daily as needed for anxiety(filled by PCP) takes 3-4 times per month 4.Next appointment:1/24 at 1 PM for 20 mins, video  Past trials of medication: sertraline, Prozac, Paxil, Lexapro, Effexor, Wellbutrin (worsening SI/insomnia)Lamotrigine   The patient demonstrates the following risk factors for suicide: Chronic risk factors for suicide include: psychiatric disorder of depressionand previous suicide attempts of having a gun. 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 Protective factorsfor this patient include: positive social support, coping skills and hope for the future. Considering these factors, the overall suicide risk at this point appearslow. Patient isappropriate for outpatient follow  up   Neysa Hotter, MD 03/02/2020, 1:56 PM

## 2020-02-20 ENCOUNTER — Ambulatory Visit (HOSPITAL_COMMUNITY): Payer: BC Managed Care – PPO | Admitting: Certified Registered Nurse Anesthetist

## 2020-02-20 ENCOUNTER — Encounter (HOSPITAL_COMMUNITY): Payer: Self-pay | Admitting: Gastroenterology

## 2020-02-20 ENCOUNTER — Telehealth (HOSPITAL_COMMUNITY): Payer: BC Managed Care – PPO | Admitting: Psychiatry

## 2020-02-20 ENCOUNTER — Other Ambulatory Visit: Payer: Self-pay

## 2020-02-20 ENCOUNTER — Encounter (HOSPITAL_COMMUNITY)
Admission: RE | Disposition: A | Discharge status: Home / Self Care | Payer: Self-pay | Source: Ambulatory Visit | Attending: Gastroenterology

## 2020-02-20 ENCOUNTER — Ambulatory Visit (HOSPITAL_COMMUNITY)
Admission: RE | Admit: 2020-02-20 | Discharge: 2020-02-20 | Disposition: A | Discharge status: Home / Self Care | Payer: BC Managed Care – PPO | Source: Ambulatory Visit | Attending: Gastroenterology | Admitting: Gastroenterology

## 2020-02-20 DIAGNOSIS — K760 Fatty (change of) liver, not elsewhere classified: Secondary | ICD-10-CM | POA: Insufficient documentation

## 2020-02-20 DIAGNOSIS — I428 Other cardiomyopathies: Secondary | ICD-10-CM | POA: Insufficient documentation

## 2020-02-20 DIAGNOSIS — K30 Functional dyspepsia: Secondary | ICD-10-CM | POA: Diagnosis not present

## 2020-02-20 DIAGNOSIS — Z794 Long term (current) use of insulin: Secondary | ICD-10-CM | POA: Diagnosis not present

## 2020-02-20 DIAGNOSIS — I110 Hypertensive heart disease with heart failure: Secondary | ICD-10-CM | POA: Diagnosis not present

## 2020-02-20 DIAGNOSIS — Z6841 Body Mass Index (BMI) 40.0 and over, adult: Secondary | ICD-10-CM | POA: Diagnosis not present

## 2020-02-20 DIAGNOSIS — F419 Anxiety disorder, unspecified: Secondary | ICD-10-CM | POA: Diagnosis not present

## 2020-02-20 DIAGNOSIS — Z79899 Other long term (current) drug therapy: Secondary | ICD-10-CM | POA: Diagnosis not present

## 2020-02-20 DIAGNOSIS — F329 Major depressive disorder, single episode, unspecified: Secondary | ICD-10-CM | POA: Insufficient documentation

## 2020-02-20 DIAGNOSIS — D175 Benign lipomatous neoplasm of intra-abdominal organs: Secondary | ICD-10-CM | POA: Diagnosis not present

## 2020-02-20 DIAGNOSIS — I509 Heart failure, unspecified: Secondary | ICD-10-CM | POA: Insufficient documentation

## 2020-02-20 DIAGNOSIS — K3189 Other diseases of stomach and duodenum: Secondary | ICD-10-CM | POA: Diagnosis not present

## 2020-02-20 DIAGNOSIS — G4733 Obstructive sleep apnea (adult) (pediatric): Secondary | ICD-10-CM | POA: Insufficient documentation

## 2020-02-20 DIAGNOSIS — I5041 Acute combined systolic (congestive) and diastolic (congestive) heart failure: Secondary | ICD-10-CM | POA: Diagnosis not present

## 2020-02-20 HISTORY — PX: ESOPHAGOGASTRODUODENOSCOPY (EGD) WITH PROPOFOL: SHX5813

## 2020-02-20 HISTORY — PX: EUS: SHX5427

## 2020-02-20 SURGERY — UPPER ENDOSCOPIC ULTRASOUND (EUS) RADIAL
Anesthesia: Monitor Anesthesia Care

## 2020-02-20 MED ORDER — LACTATED RINGERS IV SOLN: INTRAVENOUS | Status: DC

## 2020-02-20 MED ORDER — PROPOFOL 500 MG/50ML IV EMUL
INTRAVENOUS | Status: DC | PRN
  Administered 2020-02-20: 125 ug/kg/min via INTRAVENOUS

## 2020-02-20 MED ORDER — LIDOCAINE 2% (20 MG/ML) 5 ML SYRINGE
INTRAMUSCULAR | Status: DC | PRN
  Administered 2020-02-20: 60 mg via INTRAVENOUS

## 2020-02-20 MED ORDER — LACTATED RINGERS IV SOLN
INTRAVENOUS | Status: DC
  Administered 2020-02-20: 1000 mL via INTRAVENOUS

## 2020-02-20 MED ORDER — SODIUM CHLORIDE 0.9 % IV SOLN: INTRAVENOUS | Status: DC

## 2020-02-20 MED ORDER — PROPOFOL 10 MG/ML IV BOLUS
INTRAVENOUS | Status: DC | PRN
  Administered 2020-02-20: 20 mg via INTRAVENOUS

## 2020-02-20 MED ORDER — ONDANSETRON HCL 4 MG/2ML IJ SOLN
INTRAMUSCULAR | Status: DC | PRN
  Administered 2020-02-20: 4 mg via INTRAVENOUS

## 2020-02-20 MED ORDER — PROPOFOL 500 MG/50ML IV EMUL
INTRAVENOUS | Status: AC
  Filled 2020-02-20: qty 50

## 2020-02-20 SURGICAL SUPPLY — 14 items

## 2020-02-20 NOTE — Transfer of Care (Signed)
Immediate Anesthesia Transfer of Care Note  Patient: Samantha Turner  Procedure(s) Performed: UPPER ENDOSCOPIC ULTRASOUND (EUS) RADIAL (N/A ) ESOPHAGOGASTRODUODENOSCOPY (EGD) WITH PROPOFOL (N/A )  Patient Location: Endoscopy Unit  Anesthesia Type:MAC  Level of Consciousness: awake and patient cooperative  Airway & Oxygen Therapy: Patient Spontanous Breathing and Patient connected to face mask  Post-op Assessment: Report given to RN and Post -op Vital signs reviewed and stable  Post vital signs: Reviewed and stable  Last Vitals:  Vitals Value Taken Time  BP    Temp    Pulse 58 02/20/20 1026  Resp 17 02/20/20 1026  SpO2 100 % 02/20/20 1026  Vitals shown include unvalidated device data.  Last Pain:  Vitals:   02/20/20 0910  TempSrc: Oral  PainSc: 7          Complications: No complications documented.

## 2020-02-20 NOTE — Anesthesia Postprocedure Evaluation (Signed)
Anesthesia Post Note  Patient: Samantha Turner  Procedure(s) Performed: UPPER ENDOSCOPIC ULTRASOUND (EUS) RADIAL (N/A ) ESOPHAGOGASTRODUODENOSCOPY (EGD) WITH PROPOFOL (N/A )     Patient location during evaluation: Endoscopy Anesthesia Type: MAC Level of consciousness: awake and alert Pain management: pain level controlled Vital Signs Assessment: post-procedure vital signs reviewed and stable Respiratory status: spontaneous breathing, nonlabored ventilation, respiratory function stable and patient connected to nasal cannula oxygen Cardiovascular status: blood pressure returned to baseline and stable Postop Assessment: no apparent nausea or vomiting Anesthetic complications: no   No complications documented.  Last Vitals:  Vitals:   02/20/20 1040 02/20/20 1050  BP: 115/63 124/68  Pulse: (!) 57 (!) 59  Resp: 18 16  Temp:    SpO2: 95% 95%    Last Pain:  Vitals:   02/20/20 1050  TempSrc:   PainSc: 0-No pain                 Yvon Mccord DANIEL

## 2020-02-20 NOTE — Interval H&P Note (Signed)
History and Physical Interval Note:  02/20/2020 9:27 AM  Samantha Turner  has presented today for surgery, with the diagnosis of duodenal submucosal lesion.  The various methods of treatment have been discussed with the patient and family. After consideration of risks, benefits and other options for treatment, the patient has consented to  Procedure(s): UPPER ENDOSCOPIC ULTRASOUND (EUS) RADIAL (N/A) ESOPHAGOGASTRODUODENOSCOPY (EGD) WITH PROPOFOL (N/A) as a surgical intervention.  The patient's history has been reviewed, patient examined, no change in status, stable for surgery.  I have reviewed the patient's chart and labs.  Questions were answered to the patient's satisfaction.     Rachael Fee

## 2020-02-20 NOTE — Op Note (Signed)
[facility] Patient Name: Samantha Turner Procedure Date: 02/20/2020 MRN: [id number] Attending MD: Rachael Fee , MD Date of Birth: 08-26-1960 CSN: [id number] Age: 59 Admit Type: Outpatient Procedure:                Upper EUS Indications:              EGD with Dr. Levon Hedger 12/2019 describes "Two 4 to 5                            mm submucosal polyps were found in the second                            portion of the duodenum. The lesion appeared to be                            similar to lipomas." Providers:                Rachael Fee, MD, Alison Murray, RN, Arlee Muslim Tech., Technician, Nadene Rubins Referring MD:             [name], MD Medicines:                Monitored Anesthesia Care Complications:            No immediate complications. Estimated blood loss:                            None. Estimated Blood Loss:     Estimated blood loss: none. Procedure:                Pre-Anesthesia Assessment:                           - Prior to the procedure, a History and Physical                            was performed, and patient medications and                            allergies were reviewed. The patient's tolerance of                            previous anesthesia was also reviewed. The risks                            and benefits of the procedure and the sedation                            options and risks were discussed with the patient.                            All questions were answered, and informed consent  was obtained. Prior Anticoagulants: The patient has                            taken no previous anticoagulant or antiplatelet                            agents. ASA Grade Assessment: III - A patient with                            severe systemic disease. After reviewing the risks                            and benefits, the patient was deemed in                             satisfactory condition to undergo the procedure.                           After obtaining informed consent, the endoscope was                            passed under direct vision. Throughout the                            procedure, the patient's blood pressure, pulse, and                            oxygen saturations were monitored continuously. The                            [id number] ([id number]) Olympus Radial EUS was                            introduced through the mouth, and advanced to the                            third part of duodenum. The upper EUS was                            accomplished without difficulty. The patient                            tolerated the procedure well. Scope In: Scope Out: Findings:      ENDOSCOPIC FINDING: :      The examined esophagus was endoscopically normal.      The entire examined stomach was endoscopically normal.      There was a single 1cm round, soft, yellow tinged subepithelial lesion       on the duodenal wall directly opposite the major papilla. I did not see       a second subepithelial lesion however the major papilla (viewed with       radial and linear echoendoscopes) was slightly lipomatous appearing as       well.      ENDOSONOGRAPHIC FINDING: :      1.  The subepithelial duodenal lesion described above correlated with a       round, hyperechoic, homogenous measuring 33mm across.       Endosonographically, the lesion appeared to originate from within the       submucosa (Layer 3). This was consistent with a small lipoma.      2. Limited views of the liver, spleen, pancreas, portal and splenic       vessels were all normal. Impression:               - Single 26mm lipoma along the wall directly                            opposite the major papilla. This is not causing any                            symptoms and does not require further testing.                           - I did not see a second subepithelial lesion                             however the major papilla (viewed with radial and                            linear echoendoscopes) was slightly lipomatous                            appearing as well. This probably accounts for the                            second lesion described on 12/2019 EGD. Moderate Sedation:      Not Applicable - Patient had care per Anesthesia. Recommendation:           - Discharge patient to home (ambulatory). Procedure Code(s):        --- Professional ---                           [phone], Esophagogastroduodenoscopy, flexible,                            transoral; with endoscopic ultrasound examination                            limited to the esophagus, stomach or duodenum, and                            adjacent structures Diagnosis Code(s):        --- Professional ---                           K31.89, Other diseases of stomach and duodenum CPT copyright 2019 American Medical Association. All rights reserved. The codes documented in this report are preliminary and upon coder review may  be revised to meet current compliance requirements. Rachael Fee, MD 02/20/2020 10:32:36 AM This report has been signed electronically. Number  of Addenda: 0

## 2020-02-20 NOTE — Anesthesia Preprocedure Evaluation (Addendum)
Anesthesia Evaluation  Patient identified by MRN, date of birth, ID band Patient awake    Reviewed: Allergy & Precautions, NPO status , Patient's Chart, lab work & pertinent test results  History of Anesthesia Complications Negative for: history of anesthetic complications  Airway Mallampati: II  TM Distance: >3 FB Neck ROM: Full    Dental no notable dental hx. (+) Dental Advisory Given   Pulmonary sleep apnea ,    Pulmonary exam normal        Cardiovascular hypertension, +CHF  Normal cardiovascular exam+ dysrhythmias Atrial Fibrillation   NL EF NL Perfusion study   Neuro/Psych PSYCHIATRIC DISORDERS Anxiety Depression negative neurological ROS     GI/Hepatic negative GI ROS, Neg liver ROS,   Endo/Other  diabetesHypothyroidism Morbid obesity  Renal/GU negative Renal ROS     Musculoskeletal negative musculoskeletal ROS (+)   Abdominal   Peds  Hematology negative hematology ROS (+)   Anesthesia Other Findings   Reproductive/Obstetrics                            Anesthesia Physical Anesthesia Plan  ASA: III  Anesthesia Plan: MAC   Post-op Pain Management:    Induction:   PONV Risk Score and Plan: 2 and Ondansetron and Propofol infusion  Airway Management Planned: Natural Airway  Additional Equipment:   Intra-op Plan:   Post-operative Plan:   Informed Consent: I have reviewed the patients History and Physical, chart, labs and discussed the procedure including the risks, benefits and alternatives for the proposed anesthesia with the patient or authorized representative who has indicated his/her understanding and acceptance.     Dental advisory given  Plan Discussed with: CRNA and Anesthesiologist  Anesthesia Plan Comments:        Anesthesia Quick Evaluation

## 2020-02-20 NOTE — Discharge Instructions (Signed)
YOU HAD AN ENDOSCOPIC PROCEDURE TODAY: Refer to the procedure report and other information in the discharge instructions given to you for any specific questions about what was found during the examination. If this information does not answer your questions, please call [place] office at [phone] to clarify.   YOU SHOULD EXPECT: Some feelings of bloating in the abdomen. Passage of more gas than usual. Walking can help get rid of the air that was put into your GI tract during the procedure and reduce the bloating. If you had a lower endoscopy (such as a colonoscopy or flexible sigmoidoscopy) you may notice spotting of blood in your stool or on the toilet paper. Some abdominal soreness may be present for a day or two, also.  DIET: Your first meal following the procedure should be a light meal and then it is ok to progress to your normal diet. A half-sandwich or bowl of soup is an example of a good first meal. Heavy or fried foods are harder to digest and may make you feel nauseous or bloated. Drink plenty of fluids but you should avoid alcoholic beverages for 24 hours. If you had a esophageal dilation, please see attached instructions for diet.    ACTIVITY: Your care partner should take you home directly after the procedure. You should plan to take it easy, moving slowly for the rest of the day. You can resume normal activity the day after the procedure however YOU SHOULD NOT DRIVE, use power tools, machinery or perform tasks that involve climbing or major physical exertion for 24 hours (because of the sedation medicines used during the test).   SYMPTOMS TO REPORT IMMEDIATELY: A gastroenterologist can be reached at any hour. Please call [phone]  for any of the following symptoms:   Following upper endoscopy (EGD, EUS, ERCP, esophageal dilation) Vomiting of blood or coffee ground material  New, significant abdominal pain  New, significant chest pain or pain under the shoulder blades  Painful or  persistently difficult swallowing  New shortness of breath  Black, tarry-looking or red, bloody stools  FOLLOW UP:  If any biopsies were taken you will be contacted by phone or by letter within the next 1-3 weeks. Call [phone]  if you have not heard about the biopsies in 3 weeks.  Please also call with any specific questions about appointments or follow up tests.  

## 2020-02-21 ENCOUNTER — Encounter (HOSPITAL_COMMUNITY): Payer: Self-pay | Admitting: Gastroenterology

## 2020-03-02 ENCOUNTER — Telehealth (INDEPENDENT_AMBULATORY_CARE_PROVIDER_SITE_OTHER): Payer: BC Managed Care – PPO | Admitting: Psychiatry

## 2020-03-02 ENCOUNTER — Encounter (HOSPITAL_COMMUNITY): Payer: Self-pay | Admitting: Psychiatry

## 2020-03-02 ENCOUNTER — Other Ambulatory Visit: Payer: Self-pay

## 2020-03-02 DIAGNOSIS — I10 Essential (primary) hypertension: Secondary | ICD-10-CM | POA: Diagnosis not present

## 2020-03-02 DIAGNOSIS — F3342 Major depressive disorder, recurrent, in full remission: Secondary | ICD-10-CM | POA: Diagnosis not present

## 2020-03-02 DIAGNOSIS — Z299 Encounter for prophylactic measures, unspecified: Secondary | ICD-10-CM | POA: Diagnosis not present

## 2020-03-02 DIAGNOSIS — E039 Hypothyroidism, unspecified: Secondary | ICD-10-CM | POA: Diagnosis not present

## 2020-03-02 DIAGNOSIS — E1129 Type 2 diabetes mellitus with other diabetic kidney complication: Secondary | ICD-10-CM | POA: Diagnosis not present

## 2020-03-02 DIAGNOSIS — R6889 Other general symptoms and signs: Secondary | ICD-10-CM | POA: Diagnosis not present

## 2020-03-02 DIAGNOSIS — Z6837 Body mass index (BMI) 37.0-37.9, adult: Secondary | ICD-10-CM | POA: Diagnosis not present

## 2020-03-02 MED ORDER — ARIPIPRAZOLE 5 MG PO TABS: 5.0000 mg | ORAL_TABLET | Freq: Every day | ORAL | 0 refills | Status: AC

## 2020-03-02 MED ORDER — VENLAFAXINE HCL ER 150 MG PO CP24
150.0000 mg | ORAL_CAPSULE | Freq: Every day | ORAL | 0 refills | Status: DC
Start: 2020-04-29 — End: 2020-06-29

## 2020-03-09 DIAGNOSIS — I700 Atherosclerosis of aorta: Secondary | ICD-10-CM | POA: Diagnosis not present

## 2020-03-09 DIAGNOSIS — Z8679 Personal history of other diseases of the circulatory system: Secondary | ICD-10-CM | POA: Diagnosis not present

## 2020-06-25 NOTE — Progress Notes (Signed)
Virtual Visit via Telephone Note  I connected with Samantha Turner on 06/29/20 at  1:00 PM EST by telephone and verified that I am speaking with the correct person using two identifiers.  Location: Patient: home Provider: office Persons participated in the visit- patient, provider   I discussed the limitations, risks, security and privacy concerns of performing an evaluation and management service by telephone and the availability of in person appointments. I also discussed with the patient that there may be a patient responsible charge related to this service. The patient expressed understanding and agreed to proceed.   I discussed the assessment and treatment plan with the patient. The patient was provided an opportunity to ask questions and all were answered. The patient agreed with the plan and demonstrated an understanding of the instructions.   The patient was advised to call back or seek an in-person evaluation if the symptoms worsen or if the condition fails to improve as anticipated.  I provided 12 minutes of non-face-to-face time during this encounter.   Neysa Hotter, MD    [facility] MD/PA/NP OP Progress Note  06/29/2020 1:23 PM Samantha Turner  MRN:  [id number]  Chief Complaint:  Chief Complaint    Follow-up; Depression     HPI:  This is a follow-up appointment for depression.  She states that she has been doing well.  She missed her brother during holidays, who deceased 5 years ago.  However, she thinks it has become better.  She enjoys taking a walk with her husband and her grandchildren.  They are now doing home school.  She feels good about it as she does not want them to have COVID.  She feels good that she lost 12 pounds since she has started healthier diet, and exercise due to concern of diabetes.  She wishes to lose more.  She has better sleep.  She denies feeling depressed.  She has fair concentration and energy.  She denies SI.  She denies anxiety.  She takes Valium once a  month.  She is willing to try lower dose of Abilify.   Daily routine:work on flower gardens, taking a walk, going outside with her family Household:Husband, two daughters, two grandchildren, age 91, 43  Visit Diagnosis:    ICD-10-CM   1. MDD (major depressive disorder), recurrent, in full remission (HCC)  F33.42     Past Psychiatric History: Please see initial evaluation for full details. I have reviewed the history. No updates at this time.     Past Medical History:  Past Medical History:  Diagnosis Date  . Anxiety   . Bronchogenic cyst   . Depression   . Essential hypertension   . Headache   . History of cardiac catheterization    Normal coronary arteries July 2017  . Nonischemic cardiomyopathy (HCC)   . Obstructive sleep apnea   . Paroxysmal atrial flutter (HCC)    RFA 07/2016 - Dr. Johney Frame    Past Surgical History:  Procedure Laterality Date  . A-FLUTTER ABLATION N/A 07/26/2016   Procedure: A-Flutter Ablation;  Surgeon: Hillis Range, MD;  Location: [facility] INVASIVE CV LAB;  Service: Cardiovascular;  Laterality: N/A;  . ABDOMINAL HYSTERECTOMY    . BIOPSY  01/03/2020   Procedure: BIOPSY;  Surgeon: Dolores Frame, MD;  Location: [facility] ENDO SUITE;  Service: Gastroenterology;;  gastric  . CARDIAC CATHETERIZATION N/A 12/16/2015   Procedure: Left Heart Cath and Coronary Angiography;  Surgeon: Iran Ouch, MD;  Location: [facility] INVASIVE CV LAB;  Service: Cardiovascular;  Laterality: N/A;  . CARDIOVERSION N/A 12/18/2015   Procedure: CARDIOVERSION;  Surgeon: Lars Masson, MD;  Location: [facility] ENDOSCOPY;  Service: Cardiovascular;  Laterality: N/A;  . CHOLECYSTECTOMY    . COLONOSCOPY     About 3 years ago at [facility]  . ESOPHAGOGASTRODUODENOSCOPY (EGD) WITH PROPOFOL N/A 01/03/2020   Procedure: ESOPHAGOGASTRODUODENOSCOPY (EGD) WITH PROPOFOL;  Surgeon: Dolores Frame, MD;  Location: [facility] ENDO SUITE;  Service: Gastroenterology;  Laterality: N/A;  730  .  ESOPHAGOGASTRODUODENOSCOPY (EGD) WITH PROPOFOL N/A 02/20/2020   Procedure: ESOPHAGOGASTRODUODENOSCOPY (EGD) WITH PROPOFOL;  Surgeon: Rachael Fee, MD;  Location: [facility] ENDOSCOPY;  Service: Endoscopy;  Laterality: N/A;  . EUS N/A 02/20/2020   Procedure: UPPER ENDOSCOPIC ULTRASOUND (EUS) RADIAL;  Surgeon: Rachael Fee, MD;  Location: [facility] ENDOSCOPY;  Service: Endoscopy;  Laterality: N/A;  . TEE WITHOUT CARDIOVERSION N/A 12/18/2015   Procedure: TRANSESOPHAGEAL ECHOCARDIOGRAM (TEE);  Surgeon: Lars Masson, MD;  Location: [facility] ENDOSCOPY;  Service: Cardiovascular;  Laterality: N/A;  . TUMOR REMOVAL  07/02/2015   ovarian    Family Psychiatric History: Please see initial evaluation for full details. I have reviewed the history. No updates at this time.     Family History:  Family History  Problem Relation Age of Onset  . Bladder Cancer Father   . Heart failure Mother   . COPD Mother   . Congestive Heart Failure Mother   . Thyroid disease Sister   . Suicidality Brother   . Heart disease Sister   . Healthy Brother   . Heart disease Brother   . Thyroid disease Daughter   . Healthy Daughter     Social History:  Social History   Socioeconomic History  . Marital status: Married    Spouse name: Not on file  . Number of children: Not on file  . Years of education: Not on file  . Highest education level: Not on file  Occupational History  . Not on file  Tobacco Use  . Smoking status: Never Smoker  . Smokeless tobacco: Never Used  Substance and Sexual Activity  . Alcohol use: Yes    Comment: rarel  . Drug use: No  . Sexual activity: Yes    Birth control/protection: Surgical  Other Topics Concern  . Not on file  Social History Narrative  . Not on file   Social Determinants of Health   Financial Resource Strain: Not on file  Food Insecurity: Not on file  Transportation Needs: Not on file  Physical Activity: Not on file  Stress: Not on file  Social Connections: Not on file     Allergies: No Known Allergies  Metabolic Disorder Labs: Lab Results  Component Value Date   HGBA1C 5.7 (H) 12/15/2015   MPG 117 12/15/2015   No results found for: PROLACTIN Lab Results  Component Value Date   CHOL 214 (H) 12/16/2015   TRIG 399 (H) 12/16/2015   HDL 25 (L) 12/16/2015   CHOLHDL 8.6 12/16/2015   VLDL 80 (H) 12/16/2015   LDLCALC 109 (H) 12/16/2015   Lab Results  Component Value Date   TSH 7.753 (H) 10/21/2019   TSH 1.170 12/15/2015    Therapeutic Level Labs: No results found for: LITHIUM No results found for: VALPROATE No components found for:  CBMZ  Current Medications: Current Outpatient Medications  Medication Sig Dispense Refill  . ARIPiprazole (ABILIFY) 2 MG tablet Take 1 tablet (2 mg total) by mouth daily. 90 tablet 0  . ARIPiprazole (ABILIFY) 5 MG tablet Take  1 tablet (5 mg total) by mouth daily. 90 tablet 0  . atorvastatin (LIPITOR) 40 MG tablet TAKE 1 TABLET BY MOUTH DAILY AT 6PM (Patient taking differently: Take 40 mg by mouth daily. ) 90 tablet 2  . diazepam (VALIUM) 2 MG tablet Take 0.5 tablets (1 mg total) by mouth daily as needed for anxiety. (Patient taking differently: Take 2 mg by mouth daily. ) 15 tablet 3  . furosemide (LASIX) 20 MG tablet Take 10 mg by mouth daily.    Marland Kitchen gabapentin (NEURONTIN) 100 MG capsule Take 100 mg by mouth at bedtime.    . hyoscyamine (LEVSIN SL) 0.125 MG SL tablet Place 1 tablet (0.125 mg total) under the tongue every 6 (six) hours as needed for cramping. 30 tablet 0  . metFORMIN (GLUCOPHAGE) 500 MG tablet Take 500 mg by mouth 2 (two) times daily.    . Multiple Vitamins-Minerals (ONE-A-DAY WOMENS 50+ ADVANTAGE PO) Take by mouth daily.     Marland Kitchen NOVOLOG FLEXPEN 100 UNIT/ML FlexPen Inject 2 Units into the skin daily. Sliding scale    . [START ON 07/30/2020] venlafaxine XR (EFFEXOR-XR) 150 MG 24 hr capsule Take 1 capsule (150 mg total) by mouth daily with breakfast. 90 capsule 0  . VENTOLIN HFA 108 (90 Base) MCG/ACT  inhaler Inhale 1-2 puffs into the lungs every 6 (six) hours as needed for shortness of breath or wheezing.  0   No current facility-administered medications for this visit.     Musculoskeletal: Strength & Muscle Tone: N/A Gait & Station: N/A Patient leans: N/A  Psychiatric Specialty Exam: Review of Systems  Psychiatric/Behavioral: Negative for agitation, behavioral problems, confusion, decreased concentration, dysphoric mood, hallucinations, self-injury, sleep disturbance and suicidal ideas. The patient is not nervous/anxious and is not hyperactive.   All other systems reviewed and are negative.   There were no vitals taken for this visit.There is no height or weight on file to calculate BMI.  General Appearance: NA  Eye Contact:  NA  Speech:  Clear and Coherent  Volume:  Normal  Mood:  good  Affect:  NA  Thought Process:  Coherent  Orientation:  Full (Time, Place, and Person)  Thought Content: Logical   Suicidal Thoughts:  No  Homicidal Thoughts:  No  Memory:  Immediate;   Good  Judgement:  Good  Insight:  Good  Psychomotor Activity:  Normal  Concentration:  Concentration: Good and Attention Span: Good  Recall:  Good  Fund of Knowledge: Good  Language: Good  Akathisia:  No  Handed:  Right  AIMS (if indicated): not done  Assets:  Communication Skills Desire for Improvement  ADL's:  Intact  Cognition: WNL  Sleep:  Good   Screenings:   Assessment and Plan:  Samantha Turner is a 60 y.o. year old female with a history of depression,chronic systolic dysfunction with NYHA class II/III CHF, A flutter, OSA (she declined cpap machine), who presents for follow up appointment for below.   1. MDD (major depressive disorder), recurrent, in full remission (HCC) She denies significant mood symptoms except grief of loss of her brother in 2017.  Will taper down Abilify as she has not had significant mood symptoms since she was reinitiated Abilify on September 2019.  She is aware of  its risk of metabolic side effect and EPS.  Will continue venlafaxine to target depression.  She is advised to contact the office if any worsening in her mood symptoms.   # Insomnia There has been improvement in insomnia. Although she  was diagnosed with sleep apnea, she is not amenable to use CPAP machine. Will continue to discuss as needed.  Plan I have reviewed and updated plans as below 1. Continue Effexor 150 mg daily- could not tolerate higher dose due to insomnia 2.ContinueAbilify 2 mg daily 3.Continue Valium 1 mg daily as needed for anxiety(filled by PCP) takes 3-4 times per month 4.Next appointment:4/25 at 9 AM for 20 mins, video  Past trials of medication: sertraline, Prozac, Paxil, Lexapro, Effexor, Wellbutrin (worsening SI/insomnia)Lamotrigine   The patient demonstrates the following risk factors for suicide: Chronic risk factors for suicide include: psychiatric disorder of depressionand previous suicide attempts of having a gun. 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 Protective factorsfor this patient include: positive social support, coping skills and hope for the future. Considering these factors, the overall suicide risk at this point appearslow. Patient isappropriate for outpatient follow up  Neysa Hotter, MD 06/29/2020, 1:23 PM

## 2020-06-29 ENCOUNTER — Telehealth (INDEPENDENT_AMBULATORY_CARE_PROVIDER_SITE_OTHER): Payer: 59 | Admitting: Psychiatry

## 2020-06-29 ENCOUNTER — Encounter: Payer: Self-pay | Admitting: Psychiatry

## 2020-06-29 ENCOUNTER — Other Ambulatory Visit: Payer: Self-pay

## 2020-06-29 ENCOUNTER — Telehealth (HOSPITAL_COMMUNITY): Payer: BC Managed Care – PPO | Admitting: Psychiatry

## 2020-06-29 DIAGNOSIS — F3342 Major depressive disorder, recurrent, in full remission: Secondary | ICD-10-CM | POA: Diagnosis not present

## 2020-06-29 MED ORDER — VENLAFAXINE HCL ER 150 MG PO CP24: 150.0000 mg | ORAL_CAPSULE | Freq: Every day | ORAL | 0 refills | Status: AC

## 2020-06-29 MED ORDER — ARIPIPRAZOLE 2 MG PO TABS: 2.0000 mg | ORAL_TABLET | Freq: Every day | ORAL | 0 refills | Status: DC

## 2020-06-29 NOTE — Patient Instructions (Signed)
1. Continue Effexor 150 mg daily 2.ContinueAbilify 2 mg daily 3.Next appointment:4/25 at 9 AM

## 2020-09-21 NOTE — Progress Notes (Deleted)
BH MD/PA/NP OP Progress Note  09/21/2020 2:27 PM Samantha Turner  MRN:  [id number]  Chief Complaint:  HPI: *** Visit Diagnosis: No diagnosis found.  Past Psychiatric History: Please see initial evaluation for full details. I have reviewed the history. No updates at this time.     Past Medical History:  Past Medical History:  Diagnosis Date  . Anxiety   . Bronchogenic cyst   . Depression   . Essential hypertension   . Headache   . History of cardiac catheterization    Normal coronary arteries July 2017  . Nonischemic cardiomyopathy (HCC)   . Obstructive sleep apnea   . Paroxysmal atrial flutter (HCC)    RFA 07/2016 - Dr. Johney Frame    Past Surgical History:  Procedure Laterality Date  . A-FLUTTER ABLATION N/A 07/26/2016   Procedure: A-Flutter Ablation;  Surgeon: Hillis Range, MD;  Location: [facility] INVASIVE CV LAB;  Service: Cardiovascular;  Laterality: N/A;  . ABDOMINAL HYSTERECTOMY    . BIOPSY  01/03/2020   Procedure: BIOPSY;  Surgeon: Dolores Frame, MD;  Location: [facility] ENDO SUITE;  Service: Gastroenterology;;  gastric  . CARDIAC CATHETERIZATION N/A 12/16/2015   Procedure: Left Heart Cath and Coronary Angiography;  Surgeon: Iran Ouch, MD;  Location: [facility] INVASIVE CV LAB;  Service: Cardiovascular;  Laterality: N/A;  . CARDIOVERSION N/A 12/18/2015   Procedure: CARDIOVERSION;  Surgeon: Lars Masson, MD;  Location: [facility] ENDOSCOPY;  Service: Cardiovascular;  Laterality: N/A;  . CHOLECYSTECTOMY    . COLONOSCOPY     About 3 years ago at [facility]  . ESOPHAGOGASTRODUODENOSCOPY (EGD) WITH PROPOFOL N/A 01/03/2020   Procedure: ESOPHAGOGASTRODUODENOSCOPY (EGD) WITH PROPOFOL;  Surgeon: Dolores Frame, MD;  Location: [facility] ENDO SUITE;  Service: Gastroenterology;  Laterality: N/A;  730  . ESOPHAGOGASTRODUODENOSCOPY (EGD) WITH PROPOFOL N/A 02/20/2020   Procedure: ESOPHAGOGASTRODUODENOSCOPY (EGD) WITH PROPOFOL;  Surgeon: Rachael Fee, MD;  Location: [facility] ENDOSCOPY;   Service: Endoscopy;  Laterality: N/A;  . EUS N/A 02/20/2020   Procedure: UPPER ENDOSCOPIC ULTRASOUND (EUS) RADIAL;  Surgeon: Rachael Fee, MD;  Location: [facility] ENDOSCOPY;  Service: Endoscopy;  Laterality: N/A;  . TEE WITHOUT CARDIOVERSION N/A 12/18/2015   Procedure: TRANSESOPHAGEAL ECHOCARDIOGRAM (TEE);  Surgeon: Lars Masson, MD;  Location: [facility] ENDOSCOPY;  Service: Cardiovascular;  Laterality: N/A;  . TUMOR REMOVAL  07/02/2015   ovarian    Family Psychiatric History: Please see initial evaluation for full details. I have reviewed the history. No updates at this time.     Family History:  Family History  Problem Relation Age of Onset  . Bladder Cancer Father   . Heart failure Mother   . COPD Mother   . Congestive Heart Failure Mother   . Thyroid disease Sister   . Suicidality Brother   . Heart disease Sister   . Healthy Brother   . Heart disease Brother   . Thyroid disease Daughter   . Healthy Daughter     Social History:  Social History   Socioeconomic History  . Marital status: Married    Spouse name: Not on file  . Number of children: Not on file  . Years of education: Not on file  . Highest education level: Not on file  Occupational History  . Not on file  Tobacco Use  . Smoking status: Never Smoker  . Smokeless tobacco: Never Used  Substance and Sexual Activity  . Alcohol use: Yes    Comment: rarel  . Drug use: No  . Sexual activity: Yes  Birth control/protection: Surgical  Other Topics Concern  . Not on file  Social History Narrative  . Not on file   Social Determinants of Health   Financial Resource Strain: Not on file  Food Insecurity: Not on file  Transportation Needs: Not on file  Physical Activity: Not on file  Stress: Not on file  Social Connections: Not on file    Allergies: No Known Allergies  Metabolic Disorder Labs: Lab Results  Component Value Date   HGBA1C 5.7 (H) 12/15/2015   MPG 117 12/15/2015   No results found for:  PROLACTIN Lab Results  Component Value Date   CHOL 214 (H) 12/16/2015   TRIG 399 (H) 12/16/2015   HDL 25 (L) 12/16/2015   CHOLHDL 8.6 12/16/2015   VLDL 80 (H) 12/16/2015   LDLCALC 109 (H) 12/16/2015   Lab Results  Component Value Date   TSH 7.753 (H) 10/21/2019   TSH 1.170 12/15/2015    Therapeutic Level Labs: No results found for: LITHIUM No results found for: VALPROATE No components found for:  CBMZ  Current Medications: Current Outpatient Medications  Medication Sig Dispense Refill  . ARIPiprazole (ABILIFY) 2 MG tablet Take 1 tablet (2 mg total) by mouth daily. 90 tablet 0  . ARIPiprazole (ABILIFY) 5 MG tablet Take 1 tablet (5 mg total) by mouth daily. 90 tablet 0  . atorvastatin (LIPITOR) 40 MG tablet TAKE 1 TABLET BY MOUTH DAILY AT 6PM (Patient taking differently: Take 40 mg by mouth daily. ) 90 tablet 2  . diazepam (VALIUM) 2 MG tablet Take 0.5 tablets (1 mg total) by mouth daily as needed for anxiety. (Patient taking differently: Take 2 mg by mouth daily. ) 15 tablet 3  . furosemide (LASIX) 20 MG tablet Take 10 mg by mouth daily.    Marland Kitchen gabapentin (NEURONTIN) 100 MG capsule Take 100 mg by mouth at bedtime.    . hyoscyamine (LEVSIN SL) 0.125 MG SL tablet Place 1 tablet (0.125 mg total) under the tongue every 6 (six) hours as needed for cramping. 30 tablet 0  . metFORMIN (GLUCOPHAGE) 500 MG tablet Take 500 mg by mouth 2 (two) times daily.    . Multiple Vitamins-Minerals (ONE-A-DAY WOMENS 50+ ADVANTAGE PO) Take by mouth daily.     Marland Kitchen NOVOLOG FLEXPEN 100 UNIT/ML FlexPen Inject 2 Units into the skin daily. Sliding scale    . venlafaxine XR (EFFEXOR-XR) 150 MG 24 hr capsule Take 1 capsule (150 mg total) by mouth daily with breakfast. 90 capsule 0  . VENTOLIN HFA 108 (90 Base) MCG/ACT inhaler Inhale 1-2 puffs into the lungs every 6 (six) hours as needed for shortness of breath or wheezing.  0   No current facility-administered medications for this visit.      Musculoskeletal: Strength & Muscle Tone: N/A Gait & Station: N/A Patient leans: N/A  Psychiatric Specialty Exam: Review of Systems  There were no vitals taken for this visit.There is no height or weight on file to calculate BMI.  General Appearance: {Appearance:22683}  Eye Contact:  {BHH EYE CONTACT:22684}  Speech:  Clear and Coherent  Volume:  Normal  Mood:  {BHH MOOD:22306}  Affect:  {Affect (PAA):22687}  Thought Process:  Coherent  Orientation:  Full (Time, Place, and Person)  Thought Content: Logical   Suicidal Thoughts:  {ST/HT (PAA):22692}  Homicidal Thoughts:  {ST/HT (PAA):22692}  Memory:  Immediate;   Good  Judgement:  {Judgement (PAA):22694}  Insight:  {Insight (PAA):22695}  Psychomotor Activity:  Normal  Concentration:  Concentration: Good and Attention  Span: Good  Recall:  Good  Fund of Knowledge: Good  Language: Good  Akathisia:  No  Handed:  Right  AIMS (if indicated): not done  Assets:  Communication Skills Desire for Improvement  ADL's:  Intact  Cognition: WNL  Sleep:  {BHH GOOD/FAIR/POOR:22877}   Screenings:   Assessment and Plan:  Samantha Turner is a 60 y.o. year old female with a history of depression,chronic systolic dysfunction with NYHA class II/III CHF, A flutter, OSA (she declined cpap machine) , who presents for follow up appointment for below.   1. MDD (major depressive disorder), recurrent, in full remission (HCC) She denies significant mood symptoms except grief of loss of her brother in 2017.  Will taper down Abilify as she has not had significant mood symptoms since she was reinitiated Abilify on September 2019.  She is aware of its risk of metabolic side effect and EPS.  Will continue venlafaxine to target depression.  She is advised to contact the office if any worsening in her mood symptoms.   # Insomnia There has been improvement in insomnia.Although she was diagnosed with sleep apnea, she is not amenable to use CPAP machine.  Will continue to discuss as needed.  Plan  1. Continue Effexor 150 mg daily- could not tolerate higher dose due to insomnia 2.ContinueAbilify 2 mg daily 3.Continue Valium 1 mg daily as needed for anxiety(filled by PCP)takes 3-4 times per month 4.Next appointment:4/25 at 9 AM for 20 mins, video  Past trials of medication: sertraline, Prozac, Paxil, Lexapro, Effexor, Wellbutrin (worsening SI/insomnia)Lamotrigine   The patient demonstrates the following risk factors for suicide: Chronic risk factors for suicide include: psychiatric disorder of depressionand previous suicide attempts of having a gun. 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 Protective factorsfor this patient include: positive social support, coping skills and hope for the future. Considering these factors, the overall suicide risk at this point appearslow. Patient isappropriate for outpatient follow up  Neysa Hotter, MD 09/21/2020, 2:27 PM

## 2020-09-28 ENCOUNTER — Other Ambulatory Visit: Payer: Self-pay

## 2020-09-28 ENCOUNTER — Telehealth: Payer: Self-pay | Admitting: Psychiatry

## 2020-09-28 ENCOUNTER — Telehealth: Payer: 59 | Admitting: Psychiatry

## 2020-09-28 NOTE — Telephone Encounter (Signed)
Sent link for video visit through Epic. Patient did not sign in. Called the patient twice for appointment scheduled today. The patient did not answer the phone. Left voice message to contact the office ([phone]).   

## 2020-10-01 ENCOUNTER — Other Ambulatory Visit: Payer: Self-pay | Admitting: Psychiatry

## 2020-10-01 ENCOUNTER — Telehealth: Payer: Self-pay

## 2020-10-01 MED ORDER — ARIPIPRAZOLE 2 MG PO TABS: 2.0000 mg | ORAL_TABLET | Freq: Every day | ORAL | 0 refills | Status: DC

## 2020-10-01 NOTE — Telephone Encounter (Signed)
Ordered. Could you contact her to make follow up appointment? Will not do any more refills without evaluation.

## 2020-10-01 NOTE — Telephone Encounter (Signed)
Received fax requesting a refill on the aripirazole 53md    ARIPiprazole (ABILIFY) 2 MG tablet Medication Date: 06/29/2020 Department: [facility] Ordering/Authorizing: Neysa Hotter, MD    Order Providers  Prescribing Provider Encounter Provider  Neysa Hotter, MD Neysa Hotter, MD   Outpatient Medication Detail   Disp Refills Start End   ARIPiprazole (ABILIFY) 2 MG tablet 90 tablet 0 06/29/2020    Sig - Route: Take 1 tablet (2 mg total) by mouth daily. - Oral   Sent to pharmacy as: ARIPiprazole (ABILIFY) 2 MG tablet   Notes to Pharmacy: Dose reduced   E-Prescribing Status: Receipt confirmed by pharmacy (06/29/2020 1:31 PM EST)

## 2020-10-05 NOTE — Telephone Encounter (Signed)
Sent message to front desk to contact patient to set up an appt.

## 2020-10-13 ENCOUNTER — Telehealth: Payer: Self-pay | Admitting: Psychiatry

## 2020-10-14 ENCOUNTER — Other Ambulatory Visit: Payer: Self-pay | Admitting: Cardiology

## 2020-10-20 NOTE — Telephone Encounter (Signed)
This patient has been called multiple times and left a voicemail to call office to set up an appt and there has not been any return call. Will close this note.

## 2020-10-20 NOTE — Telephone Encounter (Signed)
Noted, thanks!

## 2020-12-17 ENCOUNTER — Other Ambulatory Visit: Payer: Self-pay | Admitting: Cardiology

## 2021-01-04 ENCOUNTER — Other Ambulatory Visit: Payer: Self-pay | Admitting: Internal Medicine

## 2021-01-04 DIAGNOSIS — Z139 Encounter for screening, unspecified: Secondary | ICD-10-CM

## 2021-02-03 ENCOUNTER — Other Ambulatory Visit: Payer: Self-pay

## 2021-02-03 ENCOUNTER — Ambulatory Visit (INDEPENDENT_AMBULATORY_CARE_PROVIDER_SITE_OTHER): Payer: 59 | Admitting: Internal Medicine

## 2021-02-03 ENCOUNTER — Encounter: Payer: Self-pay | Admitting: Internal Medicine

## 2021-02-03 DIAGNOSIS — R0609 Other forms of dyspnea: Secondary | ICD-10-CM

## 2021-02-03 DIAGNOSIS — R0600 Dyspnea, unspecified: Secondary | ICD-10-CM | POA: Diagnosis not present

## 2021-02-03 NOTE — Progress Notes (Signed)
Samantha Turner, female    DOB: May 12, 1961    MRN: [id number]   Brief patient profile:  42 yowf never smoker with nl IUP's s resp problems at wt 160 baseline and eval in [facility] for cpap by Samantha Turner 2017-18 @ wt 240   could not tolerated with some breathing problems in 01/2016 rx albuterol prn with new complaint of intermittent L cp with deep breath and new doe 08/2019 and eval in [facility] with abnormal CT /abd US showing probable cirrhosis from fatty steatosis and splenomegaly (2x nl size) assoc with small L pl effusion so referred to pulmonary clinic in [facility]  10/21/2019 by Samantha   Sherryll Turner   Had also been eval summer 2017 for likely bronchogenic or mediastinal cyst felt to be benign and no further w/u needed      History of Present Illness  10/21/2019  Pulmonary/ 1st Turner eval/Samantha Turner/Samantha Turner @ wt 246  Chief Complaint  Patient presents with   Pulmonary Consult    Referred by Samantha Samantha Turner for Peritracheal Mass. Pt c/o pain in her left side x 2 months- left rib area up to her left ear. She also c/o SOB with or without any exertion- worse with exertion.   Dyspnea:  MMRC3 = can't walk 100 yards even at a slow pace at a flat grade s stopping due to sob   Cough: none  Sleep:45 degrees flat bed lots of pillows x years  SABA use: once a week Cp is not really intermittent in that it feels uncomfortable every time she takes a deep breath with initially pain radiating to the L shoulder no longer present.  rec Will refer to GI to eval cirrhosis/ splenomegaly and f/u in 6 weeks with cxr on return    12/06/2019  f/u ov/Samantha Turner re:  Doe/ abd pain Chief Complaint  Patient presents with   Follow-up    Breathing has improved some since the last visit. She is using her albuterol inhaler 2 x per wk on average.   Dyspnea:  [place] at [employer] slow pace  Cough: none  Sleeping: 45 degrees no problem  SABA use:as above, never prechallenges  02: no Rec I strongly recommend your get the pfizer or  moderna vaccine I will recheck your Thyroid function when you see your GI doctor for labs next week Try albuterol 15 min before an activity that you know would make you short of breath and see if it makes any difference and if makes none then don't take it after activity unless you can't catch your breath. To get the most out of exercise, you need to be continuously aware that you are short of breath,  Please schedule a follow up visit in 3 months but call sooner if needed    02/03/2021  f/u ov/Samantha Turner/Samantha Turner re: obesity  Chief Complaint  Patient presents with   Follow-up    Patient states that she is not experiencing any SOB or cough. No issues to report per pt.   Dyspnea:  no regular walking / a little tired in [employer]/ uses [employer] parking  Cough: none  Sleeping: bed is flat, 2 pillows  SABA use: rarely maybe once a month 02: none  Covid status: vax x 0 / never infected  Lung cancer screening: never smoker    No obvious day to day or daytime variability or assoc excess/ purulent sputum or mucus plugs or hemoptysis or cp or chest tightness, subjective wheeze or overt sinus or hb symptoms.  Sleeping as above without nocturnal  or early am exacerbation  of respiratory  c/o's or need for noct saba. Also denies any obvious fluctuation of symptoms with weather or environmental changes or other aggravating or alleviating factors except as outlined above   No unusual exposure hx or h/o childhood pna/ asthma or knowledge of premature birth.  Current Allergies, Complete Past Medical History, Past Surgical History, Family History, and Social History were reviewed in [employer] record.  ROS  The following are not active complaints unless bolded Hoarseness, sore throat, dysphagia, dental problems, itching, sneezing,  nasal congestion or discharge of excess mucus or purulent secretions, ear ache,   fever, chills, sweats, unintended wt loss or wt gain, classically  pleuritic or exertional cp,  orthopnea pnd or arm/hand swelling  or leg swelling, presyncope, palpitations, abdominal pain, anorexia, nausea, vomiting, diarrhea  or change in bowel habits or change in bladder habits, change in stools or change in urine, dysuria, hematuria,  rash, arthralgias, visual complaints, headache, numbness, weakness or ataxia or problems with walking or coordination,  change in mood or  memory.        Current Meds  Medication Sig   ARIPiprazole (ABILIFY) 5 MG tablet Take 1 tablet (5 mg total) by mouth daily.   atorvastatin (LIPITOR) 40 MG tablet TAKE 1 TABLET BY MOUTH DAILY AT 6PM   diazepam (VALIUM) 2 MG tablet Take 0.5 tablets (1 mg total) by mouth daily as needed for anxiety. (Patient taking differently: Take 2 mg by mouth daily.)   furosemide (LASIX) 20 MG tablet Take 10 mg by mouth daily.   gabapentin (NEURONTIN) 100 MG capsule Take 100 mg by mouth at bedtime.   hyoscyamine (LEVSIN SL) 0.125 MG SL tablet Place 1 tablet (0.125 mg total) under the tongue every 6 (six) hours as needed for cramping.   metFORMIN (GLUCOPHAGE) 500 MG tablet Take 500 mg by mouth 2 (two) times daily.   Multiple Vitamins-Minerals (ONE-A-DAY WOMENS 50+ ADVANTAGE PO) Take by mouth daily.    NOVOLOG FLEXPEN 100 UNIT/ML FlexPen Inject 2 Units into the skin daily. Sliding scale   venlafaxine XR (EFFEXOR-XR) 150 MG 24 hr capsule Take 1 capsule (150 mg total) by mouth daily with breakfast.   VENTOLIN HFA 108 (90 Base) MCG/ACT inhaler Inhale 1-2 puffs into the lungs every 6 (six) hours as needed for shortness of breath or wheezing.                    Past Medical History:  Diagnosis Date   Anxiety    Bronchogenic cyst    Depression    Essential hypertension    Headache    History of cardiac catheterization    Normal coronary arteries July 2017   Nonischemic cardiomyopathy (HCC)    Obstructive sleep apnea    Paroxysmal atrial flutter (HCC)    RFA 07/2016 - Samantha. Johney Turner         Objective:         02/03/2021        248  12/06/2019          242   10/21/19 246 lb (111.6 kg)  12/24/18 257 lb (116.6 kg)  09/20/17 259 lb (117.5 kg)        Vital signs reviewed  02/03/2021  - Note at rest 02 sats  97% on RA   General appearance:    obese wf nad    HEENT : pt wearing mask not removed for exam due  to covid -19 concerns.    NECK :  without JVD/Nodes/TM/ nl carotid upstrokes bilaterally   LUNGS: no acc muscle use,  Nl contour chest which is clear to A and P bilaterally without cough on insp or exp maneuvers   CV:  RRR  no s3 or murmur or increase in P2, and no edema   ABD:  soft and nontender with nl inspiratory excursion in the supine position. No bruits or organomegaly appreciated, bowel sounds nl  MS:  Nl gait/ ext warm without deformities, calf tenderness, cyanosis or clubbing No obvious joint restrictions   SKIN: warm and dry without lesions    NEURO:  alert, approp, nl sensorium with  no motor or cerebellar deficits apparent.                 Lab Results  Component Value Date   TSH 7.753 (H) 10/21/2019               Assessment

## 2021-02-03 NOTE — Patient Instructions (Signed)
To get the most out of exercise, you need to be continuously aware that you are short of breath, but never out of breath, for at least 30 minutes daily. As you improve, it will actually be easier for you to do the same amount of exercise  in  30 minutes so always push to the level where you are short of breath.      If you are satisfied with your treatment plan,  let your doctor know and he/she can either refill your medications or you can return here when your prescription runs out.     If in any way you are not 100% satisfied,  please tell us.  If 100% better, tell your friends!  Pulmonary follow up is as needed

## 2021-02-04 ENCOUNTER — Encounter: Payer: Self-pay | Admitting: Internal Medicine

## 2021-02-04 NOTE — Assessment & Plan Note (Signed)
Worse since March 2021 with LUQ/L Chest pain and very small effusion - alpha one AT  Phenotype  10/21/19  MM  Level 160 -  12/06/2019   Walked RA  approx   400 ft  @ nl pace  stopped due to end of study, min sob with sats  98%  - 02/03/2021   Walked on RA x  3  lap(s) =  approx 450 @ moderate pace, stopped due to end of study mild sob  with lowest 02 sats 95%   Appears most c/w obesity/ deconditioning > rec reg sub max ex see avs for instructions unique to this ov   F/u can be prn if not improving with wt loss and correction of her hypothyrodism

## 2021-02-04 NOTE — Assessment & Plan Note (Signed)
Body mass index is 45.37 kg/m.  -  trending up still  Lab Results  Component Value Date   TSH 7.753 (H) 10/21/2019      Contributing to doe and risk of GERD >>>   reviewed the need and the process to achieve and maintain neg calorie balance > defer f/u primary care including intermittently monitoring thyroid status     Each maintenance medication was reviewed in detail including emphasizing most importantly the difference between maintenance and prns and under what circumstances the prns are to be triggered using an action plan format where appropriate.  Total time for H and P, chart review, counseling,  directly observing portions of ambulatory 02 saturation study/ and generating customized AVS unique to this office visit / same day charting = 22 min

## 2021-03-04 NOTE — Telephone Encounter (Signed)
CLOSED

## 2021-03-17 ENCOUNTER — Ambulatory Visit: Payer: 59 | Admitting: Cardiology

## 2021-03-17 ENCOUNTER — Encounter: Payer: Self-pay | Admitting: Cardiology

## 2021-03-17 VITALS — BP 132/78 | HR 76 | Ht 62.0 in | Wt 247.0 lb

## 2021-03-17 DIAGNOSIS — E782 Mixed hyperlipidemia: Secondary | ICD-10-CM | POA: Diagnosis not present

## 2021-03-17 DIAGNOSIS — Z8679 Personal history of other diseases of the circulatory system: Secondary | ICD-10-CM | POA: Diagnosis not present

## 2021-03-17 NOTE — Progress Notes (Signed)
Cardiology Office Note  Date: 03/17/2021   ID: Samantha Turner, DOB 08/20/1960, MRN [id number]  PCP:  Kirstie Peri, MD  Cardiologist:  Nona Dell, MD Electrophysiologist:  None   Chief Complaint  Patient presents with   Cardiac follow-up    History of Present Illness: Samantha Turner is a 60 y.o. female last seen in July 2020.  She is here for a routine visit.  Still following with Dr. Sherryll Burger for primary care.  She does not report any significant palpitations or chest pain.  I reviewed her medications which are noted below.  I personally reviewed her ECG today which shows normal sinus rhythm with low voltage and nonspecific T wave changes.  Her atrial flutter ablation was back in February 2018.  No obvious recurrences.  She underwent a Lexiscan Myoview at [facility] in May of last year that was low risk without active ischemia and normal LVEF.  Past Medical History:  Diagnosis Date   Anxiety    Bronchogenic cyst    Depression    Essential hypertension    Headache    History of cardiac catheterization    Normal coronary arteries July 2017   Nonischemic cardiomyopathy (HCC)    Obstructive sleep apnea    Paroxysmal atrial flutter (HCC)    RFA 07/2016 - Dr. Johney Frame    Past Surgical History:  Procedure Laterality Date   A-FLUTTER ABLATION N/A 07/26/2016   Procedure: A-Flutter Ablation;  Surgeon: Hillis Range, MD;  [facility]: [facility];  Service: Cardiovascular;  Laterality: N/A;   ABDOMINAL HYSTERECTOMY     BIOPSY  01/03/2020   Procedure: BIOPSY;  Surgeon: Dolores Frame, MD;  Location: [facility];  Service: Gastroenterology;;  gastric   CARDIAC CATHETERIZATION N/A 12/16/2015   Procedure: Left Heart Cath and Coronary Angiography;  Surgeon: Iran Ouch, MD;  Location: [facility] INVAS[facility] CV LAB;  Service: Cardiovascular;  Laterality: N/A;   CARDIOVERSION N/A 12/18/2015   Procedure: CARDIOVERSION;  Surgeon: Lars Masson, MD;  Location: [facility] ENDOSCOPY;   Service: Cardiovascular;  Laterality: N/A;   CHOLECYSTECTOMY     COLONOSCOPY     About 3 years ago at [facility]   ESOPHAGOGASTRODUODENOSCOPY (EGD) WITH PROPOFOL N/A 01/03/2020   Procedure: ESOPHAGOGASTRODUODENOSCOPY (EGD) WITH PROPOFOL;  Surgeon: Dolores Frame, MD;  [facility]: [facility];  Service: Gastroenterology;  Laterality: N/A;  730   ESOPHAGOGASTRODUODENOSCOPY (EGD) WITH PROPOFOL N/A 02/20/2020   Procedure: ESOPHAGOGASTRODUODENOSCOPY (EGD) WITH PROPOFOL;  Surgeon: Rachael Fee, MD;  Location: [facility] ENDOSCOPY;  Service: Endoscopy;  Laterality: N/A;   EUS N/A 02/20/2020   Procedure: UPPER ENDOSCOPIC ULTRASOUND (EUS) RADIAL;  Surgeon: Rachael Fee, MD;  Location: [facility] ENDOSCOPY;  Service: Endoscopy;  Laterality: N/A;   TEE WITHOUT CARDIOVERSION N/A 12/18/2015   Procedure: TRANSESOPHAGEAL ECHOCARDIOGRAM (TEE);  Surgeon: Lars Masson, MD;  Location: [facility] ENDOSCOPY;  Service: Cardiovascular;  Laterality: N/A;   TUMOR REMOVAL  07/02/2015   ovarian    Current Outpatient Medications  Medication Sig Dispense Refill   ARIPiprazole (ABILIFY) 2 MG tablet Take 1 tablet (2 mg total) by mouth daily. 90 tablet 0   ARIPiprazole (ABILIFY) 5 MG tablet Take 1 tablet (5 mg total) by mouth daily. 90 tablet 0   atorvastatin (LIPITOR) 40 MG tablet TAKE 1 TABLET BY MOUTH DAILY AT 6PM 30 tablet 2   diazepam (VALIUM) 2 MG tablet Take 0.5 tablets (1 mg total) by mouth daily as needed for anxiety. (Patient taking differently: Take 2 mg by  mouth daily.) 15 tablet 3   furosemide (LASIX) 20 MG tablet Take 10 mg by mouth daily.     gabapentin (NEURONTIN) 100 MG capsule Take 100 mg by mouth at bedtime.     hyoscyamine (LEVSIN SL) 0.125 MG SL tablet Place 1 tablet (0.125 mg total) under the tongue every 6 (six) hours as needed for cramping. 30 tablet 0   metFORMIN (GLUCOPHAGE) 500 MG tablet Take 500 mg by mouth 2 (two) times daily.     Multiple Vitamins-Minerals (ONE-A-DAY WOMENS 50+ ADVANTAGE PO)  Take by mouth daily.      NOVOLOG FLEXPEN 100 UNIT/ML FlexPen Inject 2 Units into the skin daily. Sliding scale     venlafaxine XR (EFFEXOR-XR) 150 MG 24 hr capsule Take 1 capsule (150 mg total) by mouth daily with breakfast. 90 capsule 0   VENTOLIN HFA 108 (90 Base) MCG/ACT inhaler Inhale 1-2 puffs into the lungs every 6 (six) hours as needed for shortness of breath or wheezing.  0   No current facility-administered medications for this visit.   Allergies:  Patient has no known allergies.   ROS: No orthopnea or PND.  No syncope.  Physical Exam: VS:  BP 132/78   Pulse 76   Ht 5\' 2"  (1.575 m)   Wt 247 lb (112 kg)   SpO2 96%   BMI 45.18 kg/m , BMI Body mass index is 45.18 kg/m.  Wt Readings from Last 3 Encounters:  03/17/21 247 lb (112 kg)  02/03/21 248 lb 0.6 oz (112.5 kg)  02/20/20 230 lb (104.3 kg)    General: Patient appears comfortable at rest. HEENT: Conjunctiva and lids normal, wearing a mask. Neck: Supple, no elevated JVP or carotid bruits, no thyromegaly. Lungs: Clear to auscultation, nonlabored breathing at rest. Cardiac: Regular rate and rhythm, no S3 or significant systolic murmur, no pericardial rub. Extremities: No pitting edema.  ECG:  An ECG dated 01/01/2020 was personally reviewed today and demonstrated:  Sinus rhythm with low voltage and nonspecific T wave changes.  Recent Labwork:    Component Value Date/Time   CHOL 214 (H) 12/16/2015 0322   TRIG 399 (H) 12/16/2015 0322   HDL 25 (L) 12/16/2015 0322   CHOLHDL 8.6 12/16/2015 0322   VLDL 80 (H) 12/16/2015 0322   LDLCALC 109 (H) 12/16/2015 0322    Other Studies Reviewed Today:  02/16/2016 Myoview 10/15/2019 [facility]): No evidence of ischemia or infarct, LVEF normal at 58%, low risk study.  Assessment and Plan:  1.  History of typical atrial flutter status post successful radiofrequency ablation by Dr. [facility] in February 2018.  ECG shows sinus rhythm today.  She has done well without obvious  recurrence.  Continue observation.  2.  History of tachycardia-mediated cardiomyopathy with improvement in LVEF.  She underwent a follow-up Lexiscan Myoview at [facility] in May of last year that showed no ischemia and LVEF 58%.  3.  Mixed hyperlipidemia, on Lipitor with follow-up by Dr. June.  Medication Adjustments/Labs and Tests Ordered: Current medicines are reviewed at length with the patient today.  Concerns regarding medicines are outlined above.   Tests Ordered: Orders Placed This Encounter  Procedures   EKG 12-Lead    Medication Changes: No orders of the defined types were placed in this encounter.   Disposition:  Follow up  1 year.  Signed, Sherryll Burger, MD, [employer] 03/17/2021 2:59 PM    [facility] at [facility] [address] [place], [place], [place] Phone: [phone]; Fax: [phone]  [phone]  

## 2021-03-17 NOTE — Patient Instructions (Addendum)

## 2021-05-04 ENCOUNTER — Other Ambulatory Visit: Payer: Self-pay | Admitting: Cardiology

## 2021-05-10 ENCOUNTER — Ambulatory Visit
Admission: RE | Admit: 2021-05-10 | Discharge: 2021-05-10 | Disposition: A | Discharge status: Home / Self Care | Payer: 59 | Source: Ambulatory Visit | Attending: Internal Medicine | Admitting: Internal Medicine

## 2021-05-10 ENCOUNTER — Other Ambulatory Visit: Payer: Self-pay

## 2021-05-10 DIAGNOSIS — Z139 Encounter for screening, unspecified: Secondary | ICD-10-CM

## 2021-11-24 ENCOUNTER — Other Ambulatory Visit: Payer: Self-pay | Admitting: Cardiology

## 2021-12-10 ENCOUNTER — Telehealth: Payer: Self-pay | Admitting: Cardiology

## 2021-12-10 NOTE — Telephone Encounter (Signed)
New Message:     Pharmacist called and wanted to know if patient is still supposed to be taking Atorvastatin?   Pt c/o medication issue:  1. Name of Medication: Atorvastatin  2. How are you currently taking this medication (dosage and times per day)?   3. Are you having a reaction (difficulty breathing--STAT)?   4. What is your medication issue? Is patient still taking this medicine

## 2021-12-10 NOTE — Telephone Encounter (Signed)
Spoke with Janace Aris, resident pharmacist at [facility] who states that they received a request from Dr. Fara Boros office for rosuvastatin and wanted to know if the patient is still taking atorvastatin. Informed the pharmacist that we have atorvastatin on her current medication list and that no changes were made to med list during last visit on 03/2021. States that she is going to contact Dr.Shah's office and get back with me.

## 2022-02-21 ENCOUNTER — Other Ambulatory Visit: Payer: Self-pay | Admitting: Cardiology

## 2022-03-21 ENCOUNTER — Encounter: Payer: Self-pay | Admitting: Cardiology

## 2022-03-21 ENCOUNTER — Ambulatory Visit: Payer: 59 | Attending: Cardiology | Admitting: Cardiology

## 2022-03-21 VITALS — BP 120/88 | HR 70 | Ht 62.0 in | Wt 235.0 lb

## 2022-03-21 DIAGNOSIS — Z8679 Personal history of other diseases of the circulatory system: Secondary | ICD-10-CM

## 2022-03-21 NOTE — Patient Instructions (Signed)

## 2022-03-21 NOTE — Progress Notes (Signed)
Cardiology Office Note  Date: 03/21/2022   ID: Samantha Turner, DOB 1960/08/02, MRN [id number]  PCP:  Monico Blitz, MD  Cardiologist:  Rozann Lesches, MD Electrophysiologist:  None   Chief Complaint  Patient presents with   Cardiac follow-up    History of Present Illness: Samantha Turner is a 61 y.o. female last seen in October 2022.  She is here for a routine visit.  Reports only an occasional "skipped" beat that sounds like PVCs based on description.  No prolonged palpitations or breathlessness with typical activities.  No exertional chest pain.  I personally reviewed her ECG today which shows sinus rhythm with low voltage.  We also went over her medications.  She continues to follow regularly with Dr. Manuella Ghazi.  No other reported major health changes.  Past Medical History:  Diagnosis Date   Anxiety    Bronchogenic cyst    Depression    Essential hypertension    Headache    History of cardiac catheterization    Normal coronary arteries July 2017   Nonischemic cardiomyopathy ([place])    Obstructive sleep apnea    Paroxysmal atrial flutter ([place])    RFA 07/2016 - Dr. Rayann Heman    Past Surgical History:  Procedure Laterality Date   A-FLUTTER ABLATION N/A 07/26/2016   Procedure: A-Flutter Ablation;  Surgeon: Thompson Grayer, MD;  Location: [place] CV LAB;  Service: Cardiovascular;  Laterality: N/A;   ABDOMINAL HYSTERECTOMY     BIOPSY  01/03/2020   Procedure: BIOPSY;  Surgeon: Harvel Quale, MD;  [facility];  Service: Gastroenterology;;  gastric   CARDIAC CATHETERIZATION N/A 12/16/2015   Procedure: Left Heart Cath and Coronary Angiography;  Surgeon: Wellington Hampshire, MD;  Location: [place] CV LAB;  Service: Cardiovascular;  Laterality: N/A;   CARDIOVERSION N/A 12/18/2015   Procedure: CARDIOVERSION;  Surgeon: Dorothy Spark, MD;  Location: [facility] ENDOSCOPY;  Service: Cardiovascular;  Laterality: N/A;   CHOLECYSTECTOMY     COLONOSCOPY     About 3 years ago at  [place] (EGD) WITH PROPOFOL N/A 01/03/2020   Procedure: ESOPHAGOGASTRODUODENOSCOPY (EGD) WITH PROPOFOL;  Surgeon: Harvel Quale, MD;  Location: [facility];  Service: Gastroenterology;  Laterality: N/A;  730   ESOPHAGOGASTRODUODENOSCOPY (EGD) WITH PROPOFOL N/A 02/20/2020   Procedure: ESOPHAGOGASTRODUODENOSCOPY (EGD) WITH PROPOFOL;  Surgeon: Milus Banister, MD;  Location: [facility] ENDOSCOPY;  Service: Endoscopy;  Laterality: N/A;   EUS N/A 02/20/2020   Procedure: UPPER ENDOSCOPIC ULTRASOUND (EUS) RADIAL;  Surgeon: Milus Banister, MD;  Location: [facility] ENDOSCOPY;  Service: Endoscopy;  Laterality: N/A;   TEE WITHOUT CARDIOVERSION N/A 12/18/2015   Procedure: TRANSESOPHAGEAL ECHOCARDIOGRAM (TEE);  Surgeon: Dorothy Spark, MD;  Location: [facility] ENDOSCOPY;  Service: Cardiovascular;  Laterality: N/A;   TUMOR REMOVAL  07/02/2015   ovarian    Current Outpatient Medications  Medication Sig Dispense Refill   ARIPiprazole (ABILIFY) 5 MG tablet Take 1 tablet (5 mg total) by mouth daily. 90 tablet 0   atorvastatin (LIPITOR) 40 MG tablet TAKE 1 TABLET BY MOUTH DAILY AT 6PM 30 tablet 3   diazepam (VALIUM) 2 MG tablet Take 2 mg by mouth as needed for anxiety.     furosemide (LASIX) 20 MG tablet Take 10 mg by mouth daily.     metFORMIN (GLUCOPHAGE) 500 MG tablet Take 500 mg by mouth 2 (two) times daily.     Multiple Vitamins-Minerals (ONE-A-DAY WOMENS 50+ ADVANTAGE PO) Take by mouth daily.  NOVOLOG FLEXPEN 100 UNIT/ML FlexPen Inject 2 Units into the skin daily. Sliding scale     venlafaxine XR (EFFEXOR-XR) 150 MG 24 hr capsule Take 1 capsule (150 mg total) by mouth daily with breakfast. 90 capsule 0   VENTOLIN HFA 108 (90 Base) MCG/ACT inhaler Inhale 1-2 puffs into the lungs every 6 (six) hours as needed for shortness of breath or wheezing.  0   No current facility-administered medications for this visit.   Allergies:  Patient has no known allergies.   ROS: No  orthopnea or PND.  Physical Exam: VS:  BP 120/88 (BP Location: Left Arm, Patient Position: Sitting, Cuff Size: Large)   Pulse 70   Ht 5\' 2"  (1.575 m)   Wt 235 lb (106.6 kg)   SpO2 97%   BMI 42.98 kg/m , BMI Body mass index is 42.98 kg/m.  Wt Readings from Last 3 Encounters:  03/21/22 235 lb (106.6 kg)  03/17/21 247 lb (112 kg)  02/03/21 248 lb 0.6 oz (112.5 kg)    General: Patient appears comfortable at rest. HEENT: Conjunctiva and lids normal. Neck: Supple, no elevated JVP or carotid bruits. Lungs: Clear to auscultation, nonlabored breathing at rest. Cardiac: Regular rate and rhythm, no S3 or significant systolic murmur. Extremities: No pitting edema.  ECG:  An ECG dated 03/17/2021 was personally reviewed today and demonstrated:  Sinus rhythm with low voltage and nonspecific T wave changes.  Recent Labwork:  No interval lab work for review today.  Other Studies Reviewed Today:  Lexiscan Myoview 10/15/2019 [facility]): No evidence of ischemia or infarct, LVEF normal at 58%, low risk study.  Assessment and Plan:  1.  History of typical atrial flutter status post radiofrequency ablation in 2018.  She remains in sinus rhythm without obvious recurrent arrhythmia.  Continue observation.  2.  HFrecEF with history of tachycardia induced cardiomyopathy and normalization of LVEF.  Symptomatically stable.  Medication Adjustments/Labs and Tests Ordered: Current medicines are reviewed at length with the patient today.  Concerns regarding medicines are outlined above.   Tests Ordered: Orders Placed This Encounter  Procedures   EKG 12-Lead    Medication Changes: No orders of the defined types were placed in this encounter.   Disposition:  Follow up  1 year.  Signed, 2019, MD, [facility] 03/21/2022 4:33 PM    [facility] at [facility] [address] [place], [place], [place] Phone: [phone]; Fax: [phone]

## 2022-05-17 IMAGING — US US ABDOMEN LIMITED W/ ELASTOGRAPHY
1 series · 12 of 25 positions shown · non-contrast
Comparison: 10/15/2019 from [HOSPITAL] [facility]

CLINICAL DATA: Hepatosplenomegaly.  Prior cholecystectomy.

EXAM:
US ABDOMEN LIMITED - RIGHT UPPER QUADRANT
ULTRASOUND HEPATIC ELASTOGRAPHY
TECHNIQUE: Sonography of the right upper quadrant was performed. In addition,
ultrasound elastography evaluation of the liver was performed. A
region of interest was placed within the right lobe of the liver.
Following application of a compressive sonographic pulse, tissue
compressibility was assessed. Multiple assessments were performed at
the selected site. Median tissue compressibility was determined.
Previously, hepatic stiffness was assessed by shear wave velocity.
Based on recently published Society of Radiologists in Ultrasound
consensus article, reporting is now recommended to be performed in
the SI units of pressure (kiloPascals) representing hepatic
stiffness/elasticity. The obtained result is compared to the
published reference standards. (cACLD = compensated Advanced Chronic
Liver Disease)

[Series 1: us abdomen ruq w/elastography · 12 of 33 slices shown]
[im 2/33]
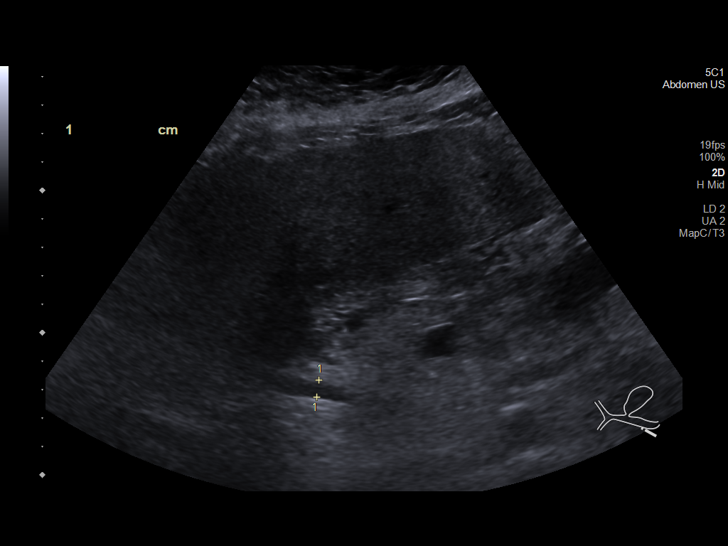
[im 5/33]
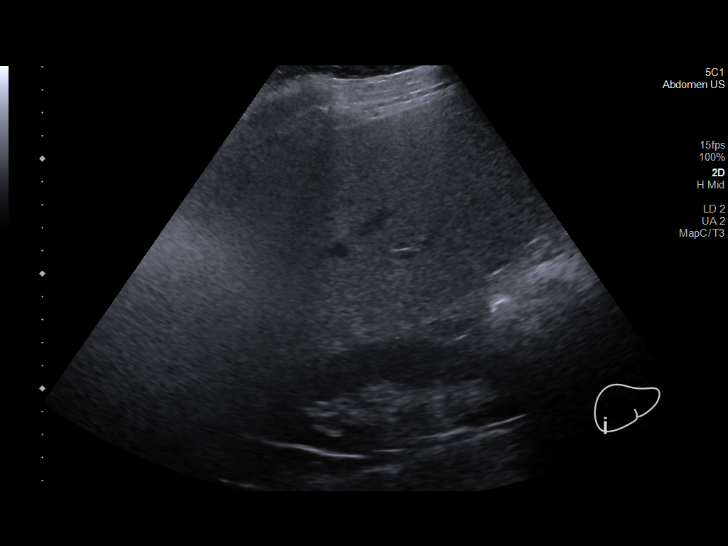
[im 7/33]
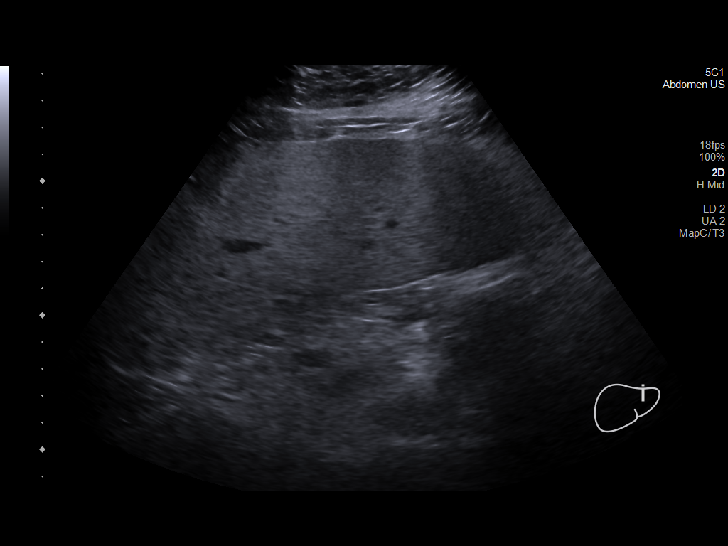
[im 10/33]
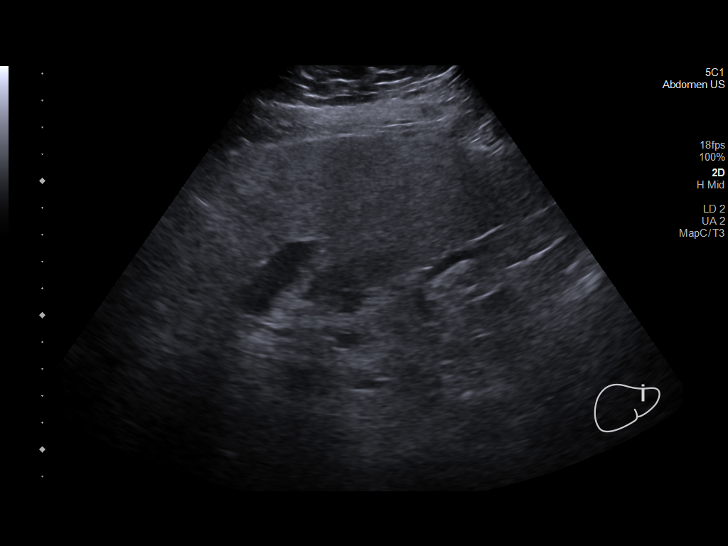
[im 13/33]
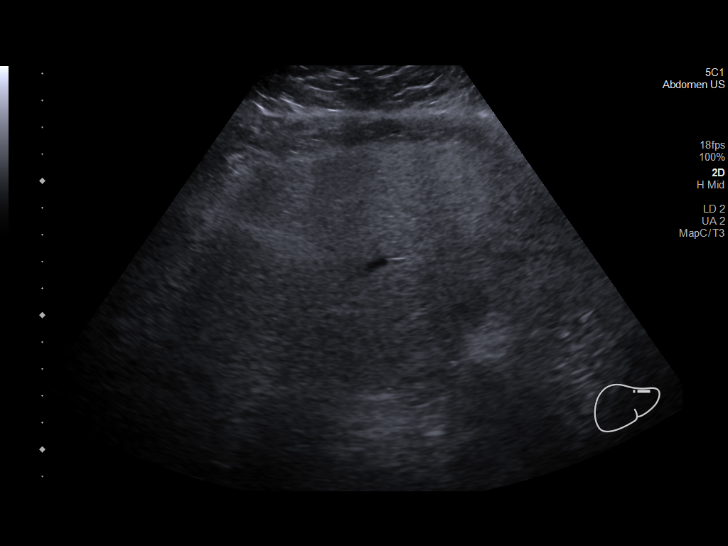
[im 15/33]
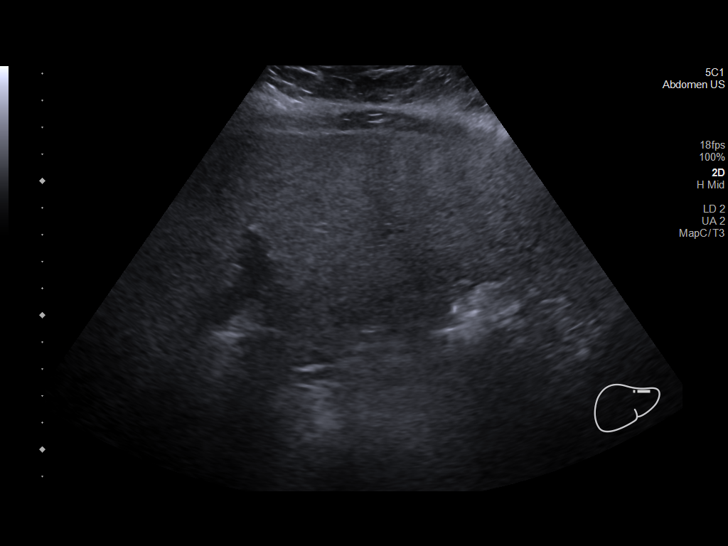
[im 18/33]
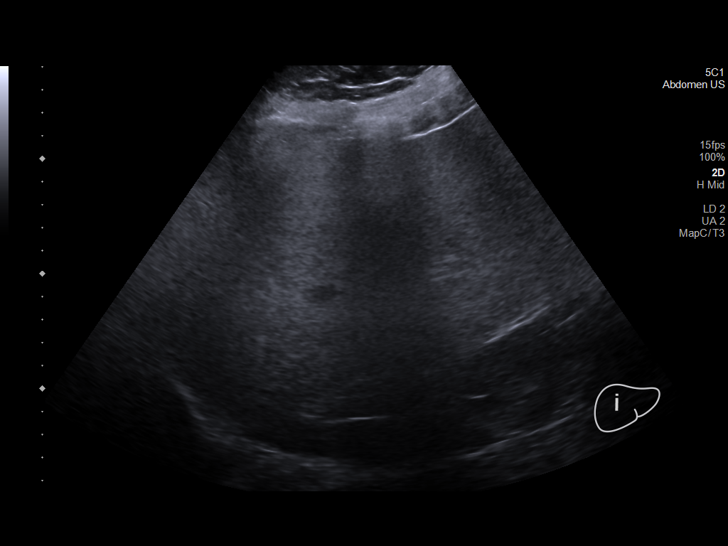
[im 21/33]
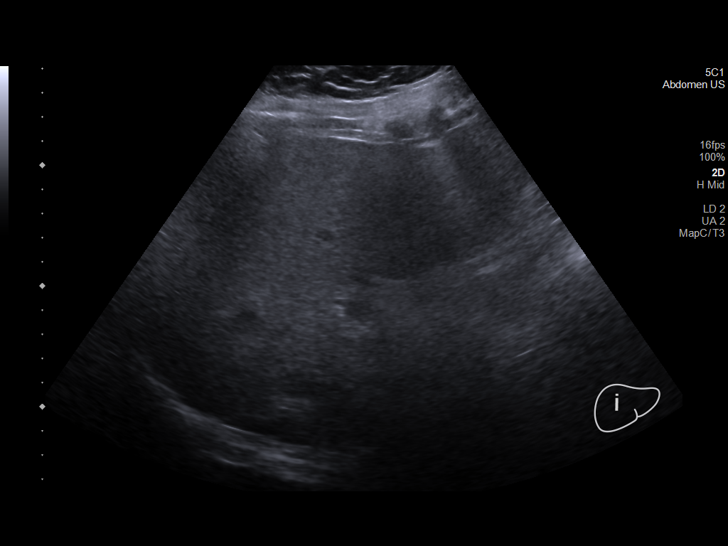
[im 23/33]
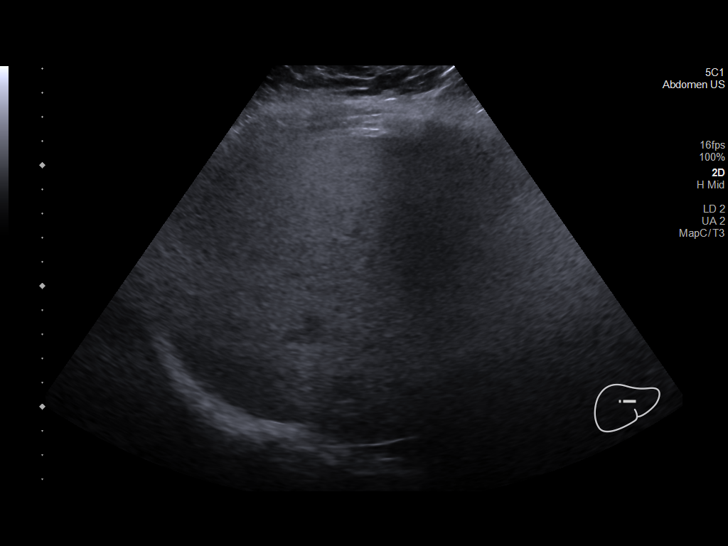
[im 26/33]
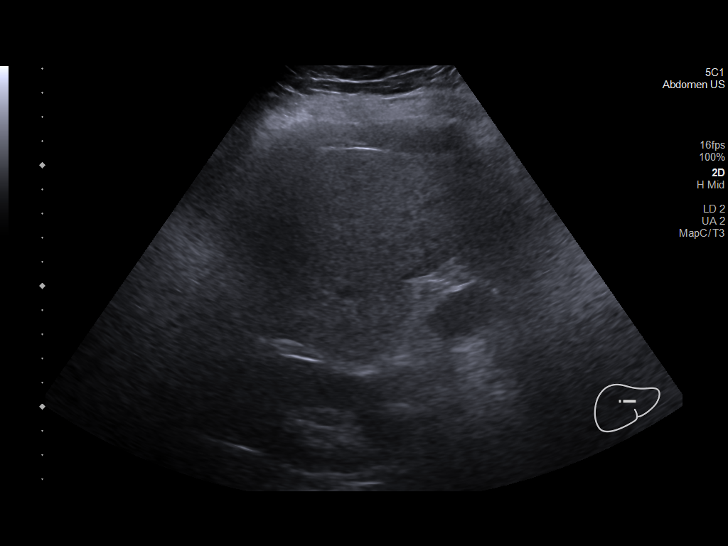
[im 29/33]
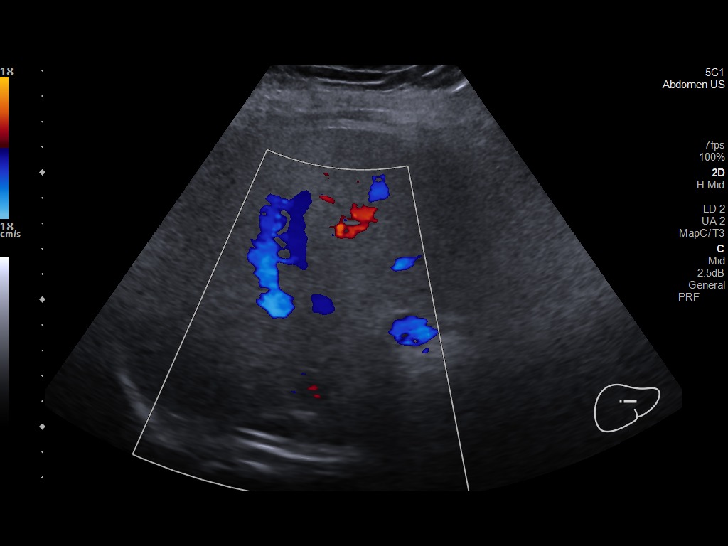
[im 31/33]
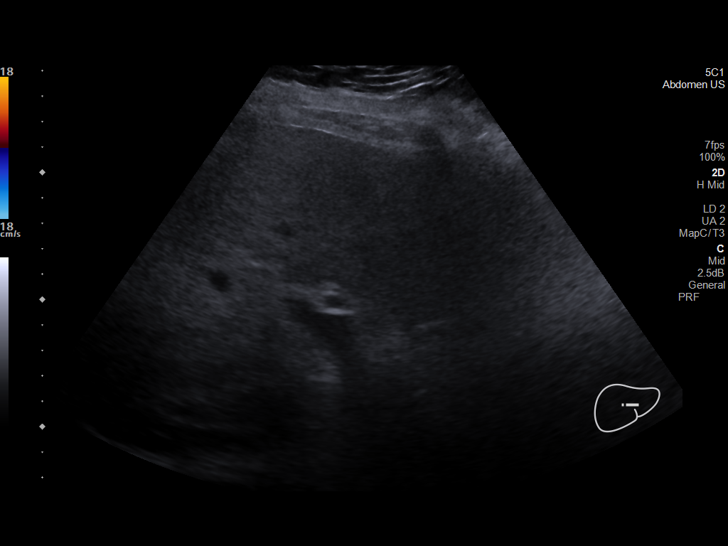

[12 of 25 positions shown; findings below may reference images not displayed]

FINDINGS: ULTRASOUND ABDOMEN LIMITED RIGHT UPPER QUADRANT

Gallbladder:

Surgically absent.

Common bile duct:

Diameter: 6 mm, within normal limits.

Liver:

Diffusely increased echogenicity of the hepatic parenchyma,
consistent with hepatic steatosis. No hepatic mass identified.
Portal vein is patent on color Doppler imaging with normal direction
of blood flow towards the liver.

ULTRASOUND HEPATIC ELASTOGRAPHY

Device: Siemens Helix VTQ

Patient position: Supine

Transducer 5C1

Number of measurements: 10

Hepatic segment:  8

Median kPa:

IQR:

IQR/Median kPa ratio:

Data quality:  Good

Diagnostic category: < or = 9 kPa: in the absence of other known
clinical signs, rules out cACLD
IMPRESSION: ULTRASOUND RUQ:

Diffuse hepatic steatosis.  No hepatic mass identified.

Prior cholecystectomy.  No evidence of biliary ductal dilatation.

ULTRASOUND HEPATIC ELASTOGRAPHY:

Median kPa:

Diagnostic category: < or = 9 kPa: in the absence of other known
clinical signs, rules out cACLD

The use of hepatic elastography is applicable to patients with viral
hepatitis and non-alcoholic fatty liver disease. At this time, there
is insufficient data for the referenced cut-off values and use in
other causes of liver disease, including alcoholic liver disease.
Patients, however, may be assessed by elastography and serve as
their own reference standard/baseline.

In patients with non-alcoholic liver disease, the values suggesting
compensated advanced chronic liver disease (cACLD) may be lower, and
patients may need additional testing with elasticity results of [DATE]
kPa.

Please note that abnormal hepatic elasticity and shear wave
velocities may also be identified in clinical settings other than
with hepatic fibrosis, such as: acute hepatitis, elevated right
heart and central venous pressures including use of beta blockers,
Neakson disease ([place]), infiltrative processes such as
mastocytosis/amyloidosis/infiltrative tumor/lymphoma, extrahepatic
cholestasis, with hyperemia in the post-prandial state, and with
liver transplantation. Correlation with patient history, laboratory
data, and clinical condition recommended.

Diagnostic Categories:

< or =5 kPa: high probability of being normal

< or =9 kPa: in the absence of other known clinical signs, rules [DATE] kPa and ?13 kPa: suggestive of cACLD, but needs further testing

>13 kPa: highly suggestive of cACLD

> or =17 kPa: highly suggestive of cACLD with an increased
probability of clinically significant portal hypertension

## 2022-07-10 ENCOUNTER — Other Ambulatory Visit: Payer: Self-pay | Admitting: Cardiology

## 2022-10-26 ENCOUNTER — Other Ambulatory Visit: Payer: Self-pay | Admitting: Internal Medicine

## 2022-10-26 DIAGNOSIS — Z1231 Encounter for screening mammogram for malignant neoplasm of breast: Secondary | ICD-10-CM

## 2022-11-15 ENCOUNTER — Ambulatory Visit
Admission: RE | Admit: 2022-11-15 | Discharge: 2022-11-15 | Disposition: A | Discharge status: Home / Self Care | Payer: Managed Care, Other (non HMO) | Source: Ambulatory Visit | Attending: Internal Medicine | Admitting: Internal Medicine

## 2022-11-15 DIAGNOSIS — Z1231 Encounter for screening mammogram for malignant neoplasm of breast: Secondary | ICD-10-CM

## 2023-09-11 ENCOUNTER — Other Ambulatory Visit: Payer: Self-pay | Admitting: Cardiology

## 2023-10-08 IMAGING — MG MM DIGITAL SCREENING BILAT W/ TOMO AND CAD
8 series · 8 of 24 positions shown · non-contrast
Comparison: None.

CLINICAL DATA: Screening.

EXAM:
DIGITAL SCREENING BILATERAL MAMMOGRAM WITH TOMOSYNTHESIS AND CAD
TECHNIQUE: Bilateral screening digital craniocaudal and mediolateral oblique
mammograms were obtained. Bilateral screening digital breast
tomosynthesis was performed. The images were evaluated with
computer-aided detection.

[L MLO synth-2D]
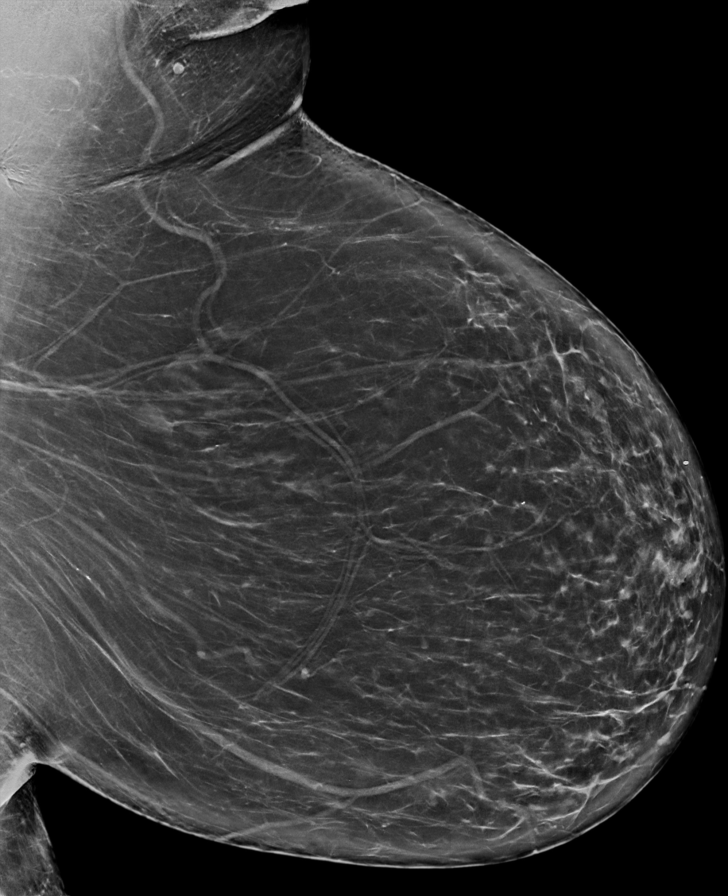

[R MLO synth-2D]
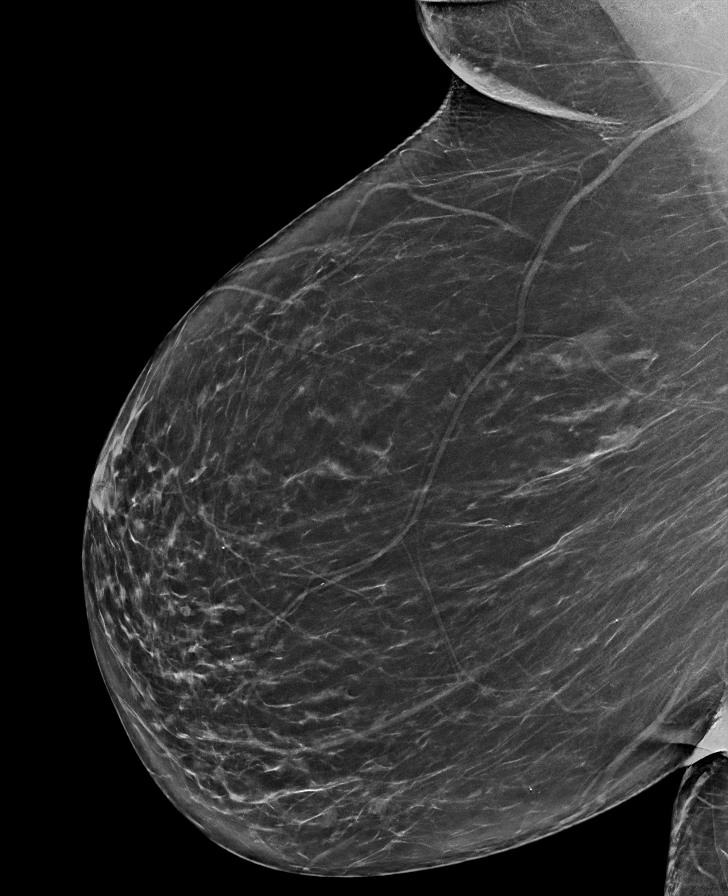

[R CC synth-2D]
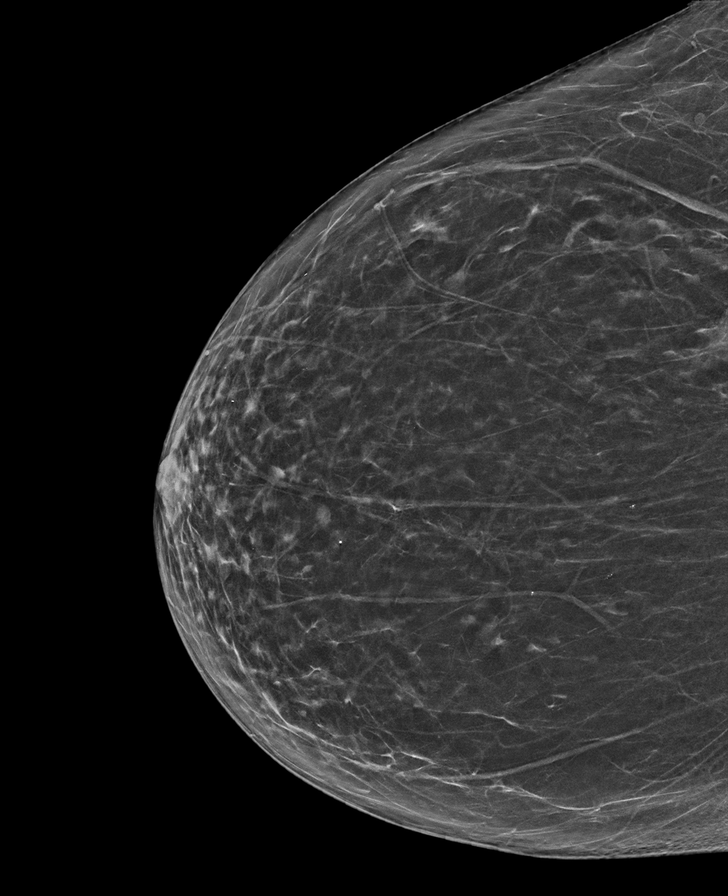

[L CC synth-2D]
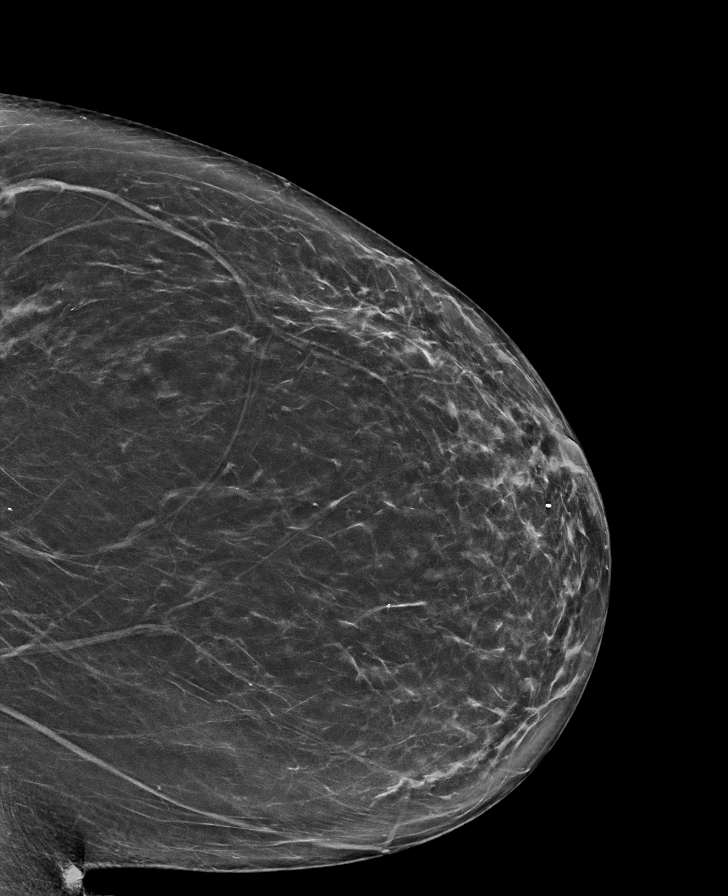

[R CC tomo · tomo slice 36/71.0]
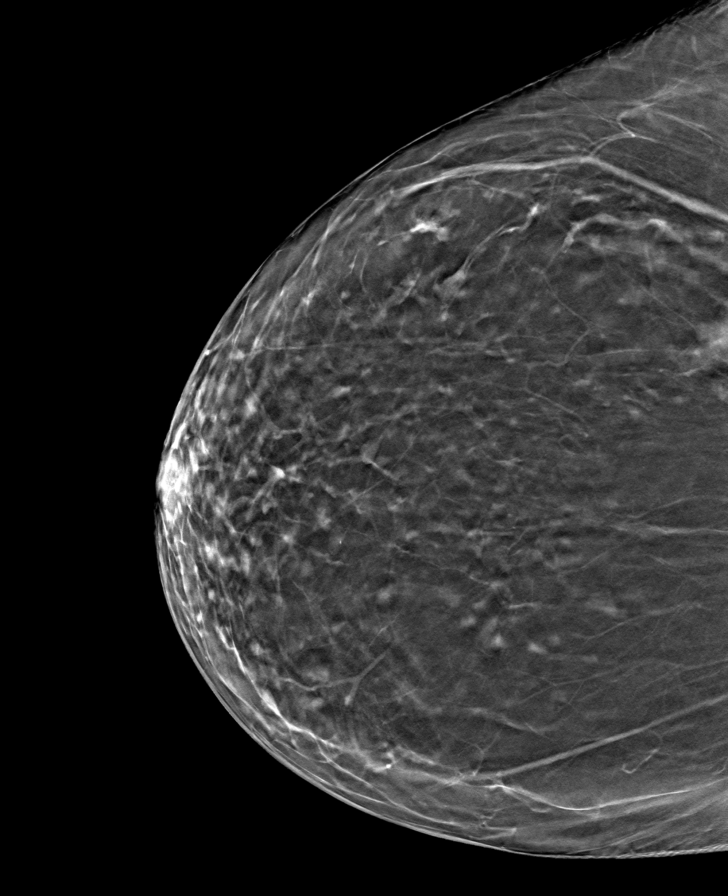

[R MLO tomo · tomo slice 37/74.0]
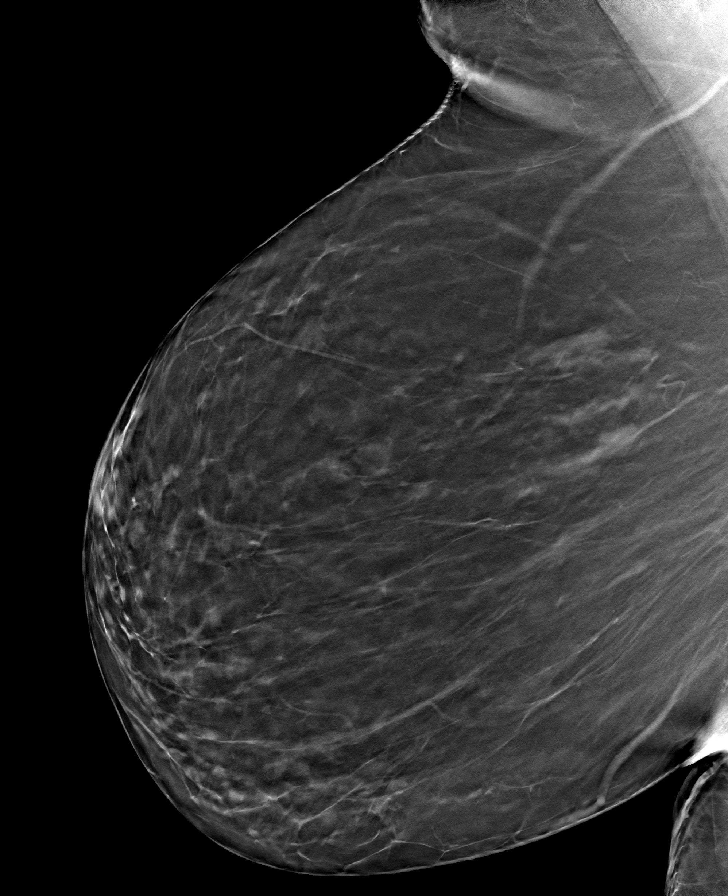

[L MLO tomo · tomo slice 42/83.0]
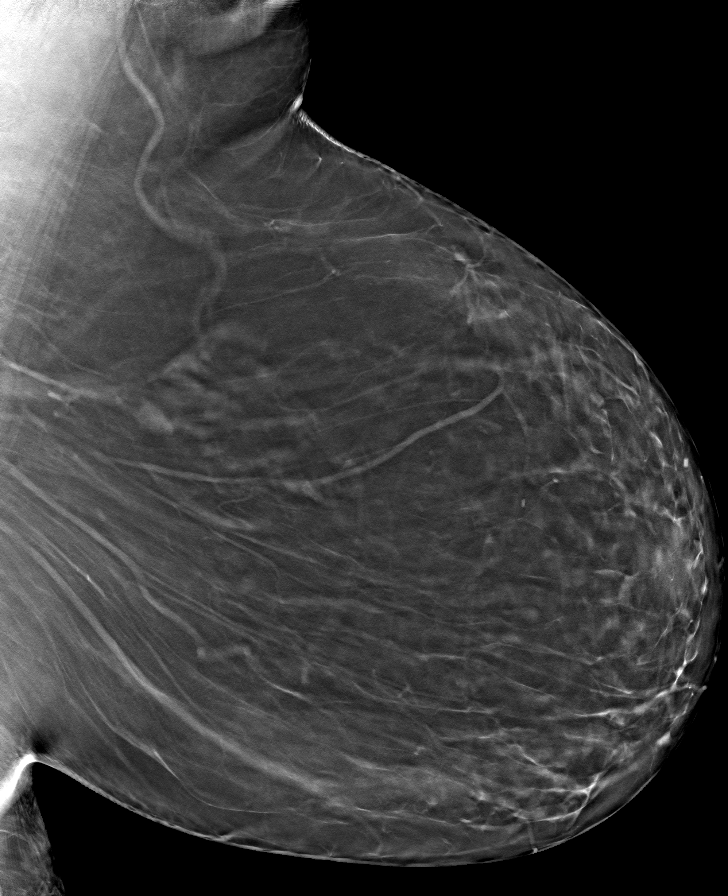

[L CC tomo · tomo slice 41/80.0]
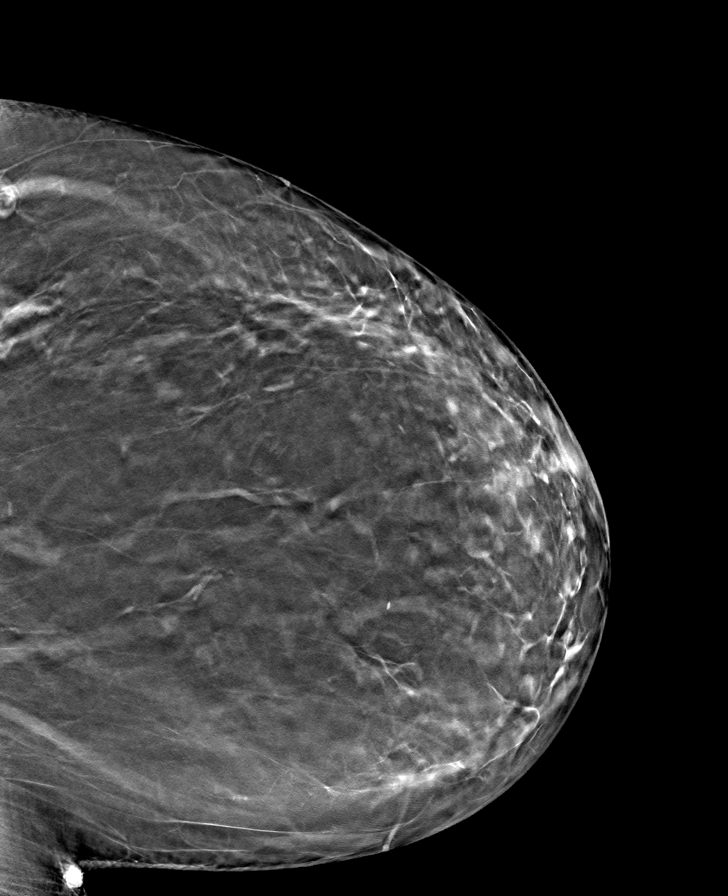

[8 of 24 positions shown; findings below may reference images not displayed]

ACR Breast Density Category b: There are scattered areas of
fibroglandular density.
FINDINGS: There are no findings suspicious for malignancy.
IMPRESSION: No mammographic evidence of malignancy. A result letter of this
screening mammogram will be mailed directly to the patient.

RECOMMENDATION:
Screening mammogram in one year. (Code:XG-X-X7B)

BI-RADS CATEGORY  1: Negative.

## 2023-11-02 ENCOUNTER — Other Ambulatory Visit: Payer: Self-pay | Admitting: Cardiology

## 2023-12-18 ENCOUNTER — Other Ambulatory Visit: Payer: Self-pay | Admitting: Cardiology

## 2024-02-21 ENCOUNTER — Other Ambulatory Visit: Payer: Self-pay | Admitting: Cardiology

## 2024-03-05 DIAGNOSIS — F319 Bipolar disorder, unspecified: Secondary | ICD-10-CM | POA: Diagnosis not present

## 2024-03-05 DIAGNOSIS — Z299 Encounter for prophylactic measures, unspecified: Secondary | ICD-10-CM | POA: Diagnosis not present

## 2024-03-05 DIAGNOSIS — I10 Essential (primary) hypertension: Secondary | ICD-10-CM | POA: Diagnosis not present

## 2024-03-05 DIAGNOSIS — E1169 Type 2 diabetes mellitus with other specified complication: Secondary | ICD-10-CM | POA: Diagnosis not present

## 2024-03-05 DIAGNOSIS — E039 Hypothyroidism, unspecified: Secondary | ICD-10-CM | POA: Diagnosis not present

## 2024-03-23 ENCOUNTER — Other Ambulatory Visit: Payer: Self-pay | Admitting: Cardiology
